# Patient Record
Sex: Male | Born: 1975
Health system: Southern US, Community
[De-identification: ages and names within clinical notes are randomized; demographics above are authoritative.]

## PROBLEM LIST (undated history)

## (undated) DIAGNOSIS — J45909 Unspecified asthma, uncomplicated: Secondary | ICD-10-CM

## (undated) DIAGNOSIS — M1990 Unspecified osteoarthritis, unspecified site: Secondary | ICD-10-CM

## (undated) DIAGNOSIS — E119 Type 2 diabetes mellitus without complications: Secondary | ICD-10-CM

## (undated) DIAGNOSIS — E785 Hyperlipidemia, unspecified: Secondary | ICD-10-CM

## (undated) HISTORY — DX: Unspecified osteoarthritis, unspecified site: M19.90

## (undated) HISTORY — PX: CLUB FOOT RELEASE: SHX1363

## (undated) HISTORY — DX: Hyperlipidemia, unspecified: E78.5

## (undated) HISTORY — DX: Type 2 diabetes mellitus without complications: E11.9

---

## 2012-11-18 ENCOUNTER — Encounter (HOSPITAL_COMMUNITY): Payer: Self-pay | Admitting: Emergency Medicine

## 2012-11-18 ENCOUNTER — Emergency Department (HOSPITAL_COMMUNITY)
Admission: EM | Admit: 2012-11-18 | Discharge: 2012-11-18 | Disposition: A | Discharge status: Home / Self Care | Payer: Medicare Other | Attending: Emergency Medicine | Admitting: Emergency Medicine

## 2012-11-18 DIAGNOSIS — Y929 Unspecified place or not applicable: Secondary | ICD-10-CM | POA: Insufficient documentation

## 2012-11-18 DIAGNOSIS — J45909 Unspecified asthma, uncomplicated: Secondary | ICD-10-CM | POA: Insufficient documentation

## 2012-11-18 DIAGNOSIS — S335XXA Sprain of ligaments of lumbar spine, initial encounter: Secondary | ICD-10-CM | POA: Insufficient documentation

## 2012-11-18 DIAGNOSIS — X503XXA Overexertion from repetitive movements, initial encounter: Secondary | ICD-10-CM | POA: Insufficient documentation

## 2012-11-18 DIAGNOSIS — Y9389 Activity, other specified: Secondary | ICD-10-CM | POA: Insufficient documentation

## 2012-11-18 DIAGNOSIS — S39012A Strain of muscle, fascia and tendon of lower back, initial encounter: Secondary | ICD-10-CM

## 2012-11-18 HISTORY — DX: Unspecified asthma, uncomplicated: J45.909

## 2012-11-18 MED ORDER — NAPROXEN 500 MG PO TABS: 500.0000 mg | ORAL_TABLET | Freq: Two times a day (BID) | ORAL | Status: DC

## 2012-11-18 MED ORDER — CYCLOBENZAPRINE HCL 10 MG PO TABS: 10.0000 mg | ORAL_TABLET | Freq: Two times a day (BID) | ORAL | Status: DC | PRN

## 2012-11-18 NOTE — ED Notes (Signed)
The patient states that he has been lifting a lot of heavy items lately.  States that he woke up today with lower back pain.

## 2012-11-18 NOTE — ED Provider Notes (Signed)
CSN: [id number]     Arrival date & time 11/18/12  1418 History  This chart was scribed for Enid Skeens, MD by Leone Payor, ED Scribe. This patient was seen in room APFT24/APFT24 and the patient's care was started 3:02 PM.    Chief Complaint  Patient presents with  . Back Pain    The history is provided by the patient. No language interpreter was used.    HPI Comments: Casey Daniels is a 37 y.o. male who presents to the Emergency Department complaining of constant, unchanged, low back pain starting last week. Pt states he has been lifting heavy things lately. He has taken goody powder without relief. He states movement aggravates the pain. He denies fever, chills, change in bowel or bladder function, numbness or weakness in lower extremities. Denies history of back surgeries. Denies IV drug use.    Past Medical History  Diagnosis Date  . Asthma    History reviewed. No pertinent past surgical history. No family history on file. History  Substance Use Topics  . Smoking status: Not on file  . Smokeless tobacco: Not on file  . Alcohol Use: Not on file    Review of Systems  Constitutional: Negative for fever and chills.  Cardiovascular: Negative for chest pain.  Genitourinary: Negative for dysuria, flank pain and difficulty urinating.  Musculoskeletal: Positive for back pain.  Neurological: Negative for weakness, numbness and headaches.    Allergies  Bee venom  Home Medications   Current Outpatient Rx  Name  Route  Sig  Dispense  Refill  . albuterol (PROAIR HFA) 108 (90 BASE) MCG/ACT inhaler   Inhalation   Inhale 2 puffs into the lungs every 6 (six) hours as needed for wheezing or shortness of breath.         . Aspirin-Acetaminophen-Caffeine (GOODY HEADACHE PO)   Oral   Take 1 packet by mouth as needed (for pain).          BP 129/96  Pulse 91  Temp(Src) 97.5 F (36.4 C) (Oral)  Resp 24  Ht 6\' 3"  (1.905 m)  Wt 197 lb (89.359 kg)  BMI 24.62 kg/m2  SpO2  100% Physical Exam  Nursing note and vitals reviewed. Constitutional: He is oriented to person, place, and time. He appears well-developed and well-nourished.  HENT:  Head: Normocephalic.  Eyes: EOM are normal.  Neck: Normal range of motion.  Pulmonary/Chest: Effort normal.  Abdominal: He exhibits no distension.  Musculoskeletal: Normal range of motion. He exhibits tenderness.  Sensation to palpation intact bilaterally. 1+ reflexes bilaterally. No midline tenderness. Right lumber paraspinal musculature is tight and tender.   Neurological: He is alert and oriented to person, place, and time. He has normal strength. No sensory deficit.  Reflex Scores:      Patellar reflexes are 1+ on the right side and 1+ on the left side.      Achilles reflexes are 1+ on the right side and 1+ on the left side. 5+ strength in F/E of LE bilat at hips, knees and great toe   Psychiatric: He has a normal mood and affect.    ED Course   Procedures (including critical care time)  DIAGNOSTIC STUDIES: Oxygen Saturation is 100% on RA, normal by my interpretation.    COORDINATION OF CARE: 3:08 PM Discussed treatment plan with pt at bedside and pt agreed to plan.   Labs Reviewed - No data to display No results found. No diagnosis found.  MDM  I personally performed the  services described in this documentation, which was scribed in my presence. The recorded information has been reviewed and is accurate.  Patient denies urinary or bowel changes, active cancer, extremity weakness, IVDU, fevers, immunosuppression or significant trauma.   I see no red flags on exam today nor indication for acute imaging.  Plan is for symptom control, and primary care physician follow up.  Return instructions and discharge instructions provided.  I estimate there is LOW risk for ABDOMINAL AORTIC ANEURYSM, CAUDA EQUINA SYNDROME, or EPIDURAL MASS LESION, thus I consider the discharge disposition reasonable. We have discussed  the diagnosis and risks, and we agree with discharging home to follow-up with their primary doctor. We also discussed returning to the Emergency Department immediately if new or worsening symptoms occur. We have discussed the symptoms which are most concerning (e.g., saddle anesthesia, urinary or bowel incontinence or retention, changing or worsening pain) that necessitate immediate return.    Enid Skeens, MD 11/18/12 [phone]

## 2012-11-18 NOTE — ED Notes (Signed)
Pain rt lower back, Has hurt intermittently, but worse this am. Increased pain with movement.

## 2012-11-18 NOTE — Discharge Instructions (Signed)
If you were given medicines take as directed.  If you are on coumadin or contraceptives realize their levels and effectiveness is altered by many different medicines.  If you have any reaction (rash, tongues swelling, other) to the medicines stop taking and see a physician.   °Please follow up as directed and return to the ER or see a physician for new or worsening symptoms.  Thank you. ° ° °

## 2015-02-19 ENCOUNTER — Encounter (HOSPITAL_COMMUNITY): Payer: Self-pay | Admitting: Nurse Practitioner

## 2015-02-19 ENCOUNTER — Emergency Department (HOSPITAL_COMMUNITY): Payer: Medicare Other

## 2015-02-19 ENCOUNTER — Emergency Department (HOSPITAL_COMMUNITY)
Admission: EM | Admit: 2015-02-19 | Discharge: 2015-02-19 | Disposition: A | Discharge status: Home / Self Care | Payer: Medicare Other | Attending: Emergency Medicine | Admitting: Emergency Medicine

## 2015-02-19 DIAGNOSIS — Z79899 Other long term (current) drug therapy: Secondary | ICD-10-CM | POA: Insufficient documentation

## 2015-02-19 DIAGNOSIS — F17210 Nicotine dependence, cigarettes, uncomplicated: Secondary | ICD-10-CM | POA: Diagnosis not present

## 2015-02-19 DIAGNOSIS — J45909 Unspecified asthma, uncomplicated: Secondary | ICD-10-CM | POA: Insufficient documentation

## 2015-02-19 DIAGNOSIS — Y998 Other external cause status: Secondary | ICD-10-CM | POA: Diagnosis not present

## 2015-02-19 DIAGNOSIS — Y9289 Other specified places as the place of occurrence of the external cause: Secondary | ICD-10-CM | POA: Diagnosis not present

## 2015-02-19 DIAGNOSIS — S6981XA Other specified injuries of right wrist, hand and finger(s), initial encounter: Secondary | ICD-10-CM | POA: Insufficient documentation

## 2015-02-19 DIAGNOSIS — Z791 Long term (current) use of non-steroidal anti-inflammatories (NSAID): Secondary | ICD-10-CM | POA: Diagnosis not present

## 2015-02-19 DIAGNOSIS — Y9389 Activity, other specified: Secondary | ICD-10-CM | POA: Insufficient documentation

## 2015-02-19 DIAGNOSIS — M79641 Pain in right hand: Secondary | ICD-10-CM

## 2015-02-19 DIAGNOSIS — Z23 Encounter for immunization: Secondary | ICD-10-CM | POA: Diagnosis not present

## 2015-02-19 MED ORDER — TETANUS-DIPHTH-ACELL PERTUSSIS 5-2.5-18.5 LF-MCG/0.5 IM SUSP
0.5000 mL | Freq: Once | INTRAMUSCULAR | Status: AC
  Administered 2015-02-19: 0.5 mL via INTRAMUSCULAR
  Filled 2015-02-19: qty 0.5

## 2015-02-19 MED ORDER — AMOXICILLIN-POT CLAVULANATE 875-125 MG PO TABS: 1.0000 | ORAL_TABLET | Freq: Every day | ORAL | Status: DC
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
## 2016-05-05 NOTE — Congregational Nurse Program (Signed)
Congregational Nurse Program Note  Date of Encounter: 05/05/2016  Past Medical History: Past Medical History:  Diagnosis Date  . Asthma     Encounter Details:     CNP Questionnaire - 05/05/16 1300      Patient Demographics   Is this a new or existing patient? Existing   Patient is considered a/an Not Applicable   Race African-American/Black     Patient Assistance   Location of Patient Assistance [place]   Patient's financial/insurance status Medicaid   Uninsured Patient ([place] [employer]) No   Patient referred to apply for the following financial assistance Not Applicable   Food insecurities addressed Provided food supplies   Transportation assistance Yes   Type of Assistance [place]   Assistance securing medications No   Educational health offerings Health literacy;Safety;Nutrition     Encounter Details   Primary purpose of visit Education/Health Concerns;Safety   Was an Emergency Department visit averted? Not Applicable   Does patient have a medical provider? Yes   Patient referred to [facility]   Was a mental health screening completed? (GAINS tool) No   Does patient have dental issues? No   Does patient have vision issues? No   Does your patient have an abnormal blood pressure today? No   Since previous encounter, have you referred patient for abnormal blood pressure that resulted in a new diagnosis or medication change? No   Does your patient have an abnormal blood glucose today? No   Since previous encounter, have you referred patient for abnormal blood glucose that resulted in a new diagnosis or medication change? No   Was there a life-saving intervention made? No     05/05/16  B/P 136/82 P 75 Has old insect bites on skin arms, legs and back, been scratching and has several open lesions.  Advised to see PCP for treatment.  Will need to ride [employer] bus.  Went home to get insurance cards.  Will do follow up check. Marko Plume [phone]

## 2016-05-26 ENCOUNTER — Ambulatory Visit (INDEPENDENT_AMBULATORY_CARE_PROVIDER_SITE_OTHER): Payer: Medicare Other | Admitting: Family

## 2016-05-26 ENCOUNTER — Encounter: Payer: Self-pay | Admitting: Family

## 2016-05-26 VITALS — BP 119/82 | HR 83 | Temp 97.3°F | Ht 75.0 in | Wt 216.0 lb

## 2016-05-26 DIAGNOSIS — Z0000 Encounter for general adult medical examination without abnormal findings: Secondary | ICD-10-CM

## 2016-05-26 DIAGNOSIS — Z114 Encounter for screening for human immunodeficiency virus [HIV]: Secondary | ICD-10-CM

## 2016-05-26 DIAGNOSIS — J4540 Moderate persistent asthma, uncomplicated: Secondary | ICD-10-CM

## 2016-05-26 DIAGNOSIS — J45909 Unspecified asthma, uncomplicated: Secondary | ICD-10-CM | POA: Insufficient documentation

## 2016-05-26 DIAGNOSIS — W57XXXA Bitten or stung by nonvenomous insect and other nonvenomous arthropods, initial encounter: Secondary | ICD-10-CM | POA: Diagnosis not present

## 2016-05-26 MED ORDER — ALBUTEROL SULFATE HFA 108 (90 BASE) MCG/ACT IN AERS: 2.0000 | INHALATION_SPRAY | Freq: Four times a day (QID) | RESPIRATORY_TRACT | 1 refills | Status: DC | PRN

## 2016-05-26 MED ORDER — MONTELUKAST SODIUM 10 MG PO TABS: 10.0000 mg | ORAL_TABLET | Freq: Every day | ORAL | 3 refills | Status: DC

## 2016-05-26 MED ORDER — BUDESONIDE-FORMOTEROL FUMARATE 80-4.5 MCG/ACT IN AERO: 2.0000 | INHALATION_SPRAY | Freq: Two times a day (BID) | RESPIRATORY_TRACT | 3 refills | Status: DC

## 2016-05-26 MED ORDER — ALBUTEROL SULFATE (2.5 MG/3ML) 0.083% IN NEBU: 2.5000 mg | INHALATION_SOLUTION | Freq: Four times a day (QID) | RESPIRATORY_TRACT | 1 refills | Status: DC | PRN

## 2016-05-26 MED ORDER — HYDROXYZINE HCL 25 MG PO TABS: 25.0000 mg | ORAL_TABLET | Freq: Three times a day (TID) | ORAL | 2 refills | Status: DC | PRN

## 2016-05-26 NOTE — Progress Notes (Signed)
Subjective:    Patient ID: Casey Daniels, male    DOB: June 18, 1975, 41 y.o.   MRN: [id number]  PT presents to the office today to establish care and for CPE. Pt has asthma that states is controlled, but has to use his albuterol daily. PT is complaining of rash from bedbugs. Pt states he has thrown away his bed and recliner. He continues to itch". Asthma  He complains of frequent throat clearing and wheezing. There is no cough or shortness of breath. This is a chronic problem. The current episode started more than 1 year ago. The problem occurs intermittently. The problem has been waxing and waning. Associated symptoms include postnasal drip and rhinorrhea. Pertinent negatives include no nasal congestion or trouble swallowing. His symptoms are aggravated by pollen. His symptoms are alleviated by rest. He reports moderate improvement on treatment. His past medical history is significant for asthma.       Review of Systems  HENT: Positive for postnasal drip and rhinorrhea. Negative for trouble swallowing.   Respiratory: Positive for wheezing. Negative for cough and shortness of breath.   All other systems reviewed and are negative.  Social History   Social History  . Marital status: Single    Spouse name: N/A  . Number of children: N/A  . Years of education: N/A   Social History Main Topics  . Smoking status: Current Every Day Smoker    Types: Cigarettes  . Smokeless tobacco: Never Used  . Alcohol use Yes  . Drug use: Yes    Types: Marijuana  . Sexual activity: Not Asked   Other Topics Concern  . None   Social History Narrative  . None    Family History  Problem Relation Age of Onset  . Diabetes Mother        Objective:   Physical Exam  Constitutional: He is oriented to person, place, and time. He appears well-developed and well-nourished. No distress.  HENT:  Head: Normocephalic.  Right Ear: External ear normal.  Left Ear: External ear normal.  Nose: Nose normal.    Mouth/Throat: Oropharynx is clear and moist.  Eyes: Pupils are equal, round, and reactive to light. Right eye exhibits no discharge. Left eye exhibits no discharge.  Neck: Normal range of motion. Neck supple. No thyromegaly present.  Cardiovascular: Normal rate, regular rhythm, normal heart sounds and intact distal pulses.   No murmur heard. Pulmonary/Chest: Effort normal. No respiratory distress. He has wheezes.  Abdominal: Soft. Bowel sounds are normal. He exhibits no distension. There is no tenderness.  Musculoskeletal: Normal range of motion. He exhibits no edema or tenderness.  Neurological: He is alert and oriented to person, place, and time.  Skin: Skin is warm and dry. Rash noted. No erythema.  Psychiatric: He has a normal mood and affect. His behavior is normal. Judgment and thought content normal.  Vitals reviewed.     BP 119/82   Pulse 83   Temp 97.3 F (36.3 C) (Oral)   Ht 6' 3"  (1.905 m)   Wt 216 lb (98 kg)   BMI 27.00 kg/m '    Assessment & Plan:  1. Moderate persistent asthma without complication -Pt started Singulair and Symbicort today -Refilled albuterol today Smoking cessation discussed today - montelukast (SINGULAIR) 10 MG tablet; Take 1 tablet (10 mg total) by mouth at bedtime.  Dispense: 90 tablet; Refill: 3 - budesonide-formoterol (SYMBICORT) 80-4.5 MCG/ACT inhaler; Inhale 2 puffs into the lungs 2 (two) times daily.  Dispense: 1 Inhaler; Refill: 3 -  CBC with Differential/Platelet - CMP14+EGFR - albuterol (PROAIR HFA) 108 (90 Base) MCG/ACT inhaler; Inhale 2 puffs into the lungs every 6 (six) hours as needed for wheezing or shortness of breath.  Dispense: 8 g; Refill: 1 - albuterol (PROVENTIL) (2.5 MG/3ML) 0.083% nebulizer solution; Take 3 mLs (2.5 mg total) by nebulization every 6 (six) hours as needed for wheezing or shortness of breath.  Dispense: 150 mL; Refill: 1  2. Annual physical exam - CBC with Differential/Platelet - CMP14+EGFR - Lipid panel -  Thyroid Panel With TSH - VITAMIN D 25 Hydroxy (Vit-D Deficiency, Fractures) - PSA, total and free - HIV antibody  3. Bedbug bite, initial encounter -Discussed putting all clothes and bedding in trash bags for three days, vacuum all bedding, couches ect - CBC with Differential/Platelet - CMP14+EGFR - hydrOXYzine (ATARAX/VISTARIL) 25 MG tablet; Take 1 tablet (25 mg total) by mouth 3 (three) times daily as needed.  Dispense: 30 tablet; Refill: 2  4. Encounter for screening for HIV - CBC with Differential/Platelet - CMP14+EGFR - HIV antibody   Continue all meds Labs pending Health Maintenance reviewed Diet and exercise encouraged RTO 6 months   Evelina Dun, FNP

## 2016-05-26 NOTE — Patient Instructions (Signed)
Bedbugs Introduction Bedbugs are tiny bugs that live in and around beds. During the day, they stay hidden. At night, they come out and bite. Where are bedbugs found? Bedbugs can be found anywhere. It does not matter if a place is clean or dirty. They are often found in:  Hotels.  Shelters.  Dorms.  Hospitals.  Nursing homes.  Places where there are many birds or bats. What are bedbug bites like? A bedbug bite leaves a small red bump with a darker red dot in the middle. The bump may show up soon after a person is bitten or a day or more later. Bedbug bites usually do not hurt, but they may itch. Most people do not need treatment for bedbug bites. The bumps usually go away on their own in a few days. How do I check for bedbugs? Bedbugs are reddish-brown, oval, and flat. They are very small and they cannot fly. Look for bedbugs in these places:  On mattresses, bed frames, headboards, and box springs.  On drapes and curtains in bedrooms.  Under the carpet in bedrooms.  Behind electrical outlets.  Behind any wallpaper that is peeling.  Inside luggage. Also look for black or red spots or stains on or near the bed. What should I do if I find bedbugs? When Traveling  Check your clothes, suitcase, and belongings for bedbugs before you go back home. You may want to throw away anything that has bedbugs on it. At Home  Your bedroom may need to be treated by a pest control expert. You may also need to throw away mattresses or luggage. To help keep bedbugs from coming back, you may want to:  Put a plastic cover over your mattress.  Wash your clothes and bedding in water that is hotter than 120F (48.9C). Dry them on a hot setting.  Vacuum often around the bed and in all of the cracks where the bugs might hide.  Check all used furniture, bedding, or clothes that you bring into your home.  Get rid of bird nests and bat roosts that are near your home. In Your Bed  Try wearing  pajamas that have long sleeves and pant legs. Bedbugs usually bite areas of the skin that are not covered. This information is not intended to replace advice given to you by your health care provider. Make sure you discuss any questions you have with your health care provider. Document Released: 07/05/2010 Document Revised: 08/26/2015 Document Reviewed: 03/16/2014  2017 Elsevier

## 2016-05-27 LAB — CMP14+EGFR
A/G RATIO: 1.5 (ref 1.2–2.2)
ALBUMIN: 4.4 g/dL (ref 3.5–5.5)
ALK PHOS: 64 IU/L (ref 39–117)
ALT: 32 IU/L (ref 0–44)
AST: 42 IU/L — ABNORMAL HIGH (ref 0–40)
BUN / CREAT RATIO: 12 (ref 9–20)
BUN: 15 mg/dL (ref 6–24)
Bilirubin Total: 0.2 mg/dL (ref 0.0–1.2)
CHLORIDE: 101 mmol/L (ref 96–106)
CO2: 24 mmol/L (ref 18–29)
Calcium: 9.9 mg/dL (ref 8.7–10.2)
Creatinine, Ser: 1.24 mg/dL (ref 0.76–1.27)
GFR calc Af Amer: 82 mL/min/{1.73_m2} (ref >59)
GFR calc non Af Amer: 71 mL/min/{1.73_m2} (ref >59)
GLUCOSE: 109 mg/dL — AB (ref 65–99)
Globulin, Total: 3 g/dL (ref 1.5–4.5)
POTASSIUM: 5 mmol/L (ref 3.5–5.2)
Sodium: 140 mmol/L (ref 134–144)
Total Protein: 7.4 g/dL (ref 6.0–8.5)

## 2016-05-27 LAB — CBC WITH DIFFERENTIAL/PLATELET
BASOS ABS: 0 10*3/uL (ref 0.0–0.2)
BASOS: 0 %
EOS (ABSOLUTE): 0.1 10*3/uL (ref 0.0–0.4)
Eos: 2 %
HEMOGLOBIN: 14.4 g/dL (ref 13.0–17.7)
Hematocrit: 44.9 % (ref 37.5–51.0)
Immature Grans (Abs): 0 10*3/uL (ref 0.0–0.1)
Immature Granulocytes: 0 %
Lymphocytes Absolute: 1.6 10*3/uL (ref 0.7–3.1)
Lymphs: 35 %
MCH: 28.9 pg (ref 26.6–33.0)
MCHC: 32.1 g/dL (ref 31.5–35.7)
MCV: 90 fL (ref 79–97)
Monocytes Absolute: 0.4 10*3/uL (ref 0.1–0.9)
Monocytes: 9 %
NEUTROS ABS: 2.4 10*3/uL (ref 1.4–7.0)
Neutrophils: 54 %
PLATELETS: 275 10*3/uL (ref 150–379)
RBC: 4.98 x10E6/uL (ref 4.14–5.80)
RDW: 14.1 % (ref 12.3–15.4)
WBC: 4.6 10*3/uL (ref 3.4–10.8)

## 2016-05-27 LAB — THYROID PANEL WITH TSH
Free Thyroxine Index: 1.6 (ref 1.2–4.9)
T3 UPTAKE RATIO: 32 % (ref 24–39)
T4 TOTAL: 5 ug/dL (ref 4.5–12.0)
TSH: 2.93 u[IU]/mL (ref 0.450–4.500)

## 2016-05-27 LAB — LIPID PANEL
CHOLESTEROL TOTAL: 163 mg/dL (ref 100–199)
Chol/HDL Ratio: 2.9 ratio units (ref 0.0–5.0)
HDL: 56 mg/dL (ref >39)
LDL Calculated: 95 mg/dL (ref 0–99)
Triglycerides: 62 mg/dL (ref 0–149)
VLDL CHOLESTEROL CAL: 12 mg/dL (ref 5–40)

## 2016-05-27 LAB — PSA, TOTAL AND FREE
PROSTATE SPECIFIC AG, SERUM: 0.4 ng/mL (ref 0.0–4.0)
PSA FREE PCT: 20 %
PSA, Free: 0.08 ng/mL

## 2016-05-27 LAB — HIV ANTIBODY (ROUTINE TESTING W REFLEX): HIV Screen 4th Generation wRfx: NONREACTIVE

## 2016-05-27 LAB — VITAMIN D 25 HYDROXY (VIT D DEFICIENCY, FRACTURES): VIT D 25 HYDROXY: 6.1 ng/mL — AB (ref 30.0–100.0)

## 2016-05-29 ENCOUNTER — Other Ambulatory Visit: Payer: Self-pay | Admitting: Family

## 2016-05-29 DIAGNOSIS — E559 Vitamin D deficiency, unspecified: Secondary | ICD-10-CM | POA: Insufficient documentation

## 2016-05-29 MED ORDER — VITAMIN D (ERGOCALCIFEROL) 1.25 MG (50000 UNIT) PO CAPS: 50000.0000 [IU] | ORAL_CAPSULE | ORAL | 3 refills | Status: DC

## 2016-08-31 ENCOUNTER — Other Ambulatory Visit: Payer: Self-pay | Admitting: Family

## 2016-08-31 DIAGNOSIS — J4540 Moderate persistent asthma, uncomplicated: Secondary | ICD-10-CM

## 2016-11-10 ENCOUNTER — Ambulatory Visit: Payer: Medicare Other | Admitting: Family

## 2016-11-13 ENCOUNTER — Encounter: Payer: Self-pay | Admitting: Family

## 2016-11-15 ENCOUNTER — Ambulatory Visit (INDEPENDENT_AMBULATORY_CARE_PROVIDER_SITE_OTHER): Payer: Medicare Other | Admitting: Pediatrics

## 2016-11-15 ENCOUNTER — Encounter: Payer: Self-pay | Admitting: Pediatrics

## 2016-11-15 VITALS — BP 132/89 | HR 83 | Temp 97.4°F | Ht 75.0 in | Wt 232.8 lb

## 2016-11-15 DIAGNOSIS — M7022 Olecranon bursitis, left elbow: Secondary | ICD-10-CM | POA: Diagnosis not present

## 2016-11-15 DIAGNOSIS — J4540 Moderate persistent asthma, uncomplicated: Secondary | ICD-10-CM

## 2016-11-15 MED ORDER — BUDESONIDE-FORMOTEROL FUMARATE 80-4.5 MCG/ACT IN AERO: 2.0000 | INHALATION_SPRAY | Freq: Two times a day (BID) | RESPIRATORY_TRACT | 3 refills | Status: DC

## 2016-11-15 MED ORDER — ALBUTEROL SULFATE (2.5 MG/3ML) 0.083% IN NEBU: 2.5000 mg | INHALATION_SOLUTION | Freq: Four times a day (QID) | RESPIRATORY_TRACT | 1 refills | Status: DC | PRN

## 2016-11-15 NOTE — Patient Instructions (Signed)
600-800mg  of ibuprofen every 8 hours for the next 5 days

## 2016-11-15 NOTE — Progress Notes (Signed)
  Subjective:   Patient ID: Casey Daniels, male    DOB: 1975/08/29, 41 y.o.   MRN: [id number] CC: Edema (Right elbow) and Pain (Right Elbow)  HPI: Casey Daniels is a 41 y.o. male presenting for Edema (Right elbow) and Pain (Right Elbow)  Started about a week ago Swollen area over elbow olecranon Leans on L elbow a lot Some tenderness to area Minimal pain/tenderness with straightening elbow, most of pain in isolated swelling area No h/o gout No fevers No redness Otherwise feeling well Ran out of symbicort last month Has albuterol at home, used last dose a few days ago Does feel like he is wheezing at times, winter tends to have more trouble breathing  Relevant past medical, surgical, family and social history reviewed. Allergies and medications reviewed and updated. History  Smoking Status  . Current Every Day Smoker  . Types: Cigarettes  Smokeless Tobacco  . Never Used   ROS: Per HPI   Objective:    BP 132/89   Pulse 83   Temp (!) 97.4 F (36.3 C) (Oral)   Ht 6\' 3"  (1.905 m)   Wt 232 lb 12.8 oz (105.6 kg)   BMI 29.10 kg/m   Wt Readings from Last 3 Encounters:  11/15/16 232 lb 12.8 oz (105.6 kg)  05/26/16 216 lb (98 kg)  11/18/12 197 lb (89.4 kg)    Gen: NAD, alert, cooperative with exam, NCAT EYES: EOMI, no conjunctival injection, or no icterus CV: NRRR, normal S1/S2, no murmur, distal pulses 2+ b/l Resp: moving air well, slight exp wheeze present b/l, normal WOB.  ABD: +BS, soft, NTND.  Ext: No edema, warm Neuro: Alert and oriented, strength equal b/l UE and LE, coordination grossly normal  MSK: swelling of L olecranon bursa Minimal ttp No redness No point tenderness over lateral/medial epicondyles L elbow Minimal discomfort in swollen area with flex/ext of elbow against resistance  Assessment & Plan:  Casey Daniels was seen today for edema and pain.  Diagnoses and all orders for this visit:  Olecranon bursitis of left elbow NSAIDs, rest, ice Avoid  resting on the elbow  Moderate persistent asthma without complication Slight wheezing today Off of symbicort Take symbicort daily, albuterol BID for the next 2-3 days Has f/u appt in 2 weeks -     albuterol (PROVENTIL) (2.5 MG/3ML) 0.083% nebulizer solution; Take 3 mLs (2.5 mg total) by nebulization every 6 (six) hours as needed for wheezing or shortness of breath. -     budesonide-formoterol (SYMBICORT) 80-4.5 MCG/ACT inhaler; Inhale 2 puffs into the lungs 2 (two) times daily.   Follow up plan: 2 weeks as scheduled Casey Found, MD [place]

## 2016-11-24 ENCOUNTER — Ambulatory Visit: Payer: Medicare Other | Admitting: Family

## 2016-11-28 ENCOUNTER — Encounter: Payer: Self-pay | Admitting: Family

## 2016-11-28 ENCOUNTER — Ambulatory Visit (INDEPENDENT_AMBULATORY_CARE_PROVIDER_SITE_OTHER): Payer: Medicare Other | Admitting: Family

## 2016-11-28 VITALS — BP 119/81 | HR 91 | Temp 97.2°F | Ht 75.0 in | Wt 232.0 lb

## 2016-11-28 DIAGNOSIS — E663 Overweight: Secondary | ICD-10-CM | POA: Diagnosis not present

## 2016-11-28 DIAGNOSIS — E559 Vitamin D deficiency, unspecified: Secondary | ICD-10-CM

## 2016-11-28 DIAGNOSIS — J4540 Moderate persistent asthma, uncomplicated: Secondary | ICD-10-CM | POA: Diagnosis not present

## 2016-11-28 DIAGNOSIS — F1010 Alcohol abuse, uncomplicated: Secondary | ICD-10-CM | POA: Diagnosis not present

## 2016-11-28 NOTE — Progress Notes (Signed)
   Subjective:    Patient ID: Casey Daniels, male    DOB: September 24, 1975, 41 y.o.   MRN: [id number]  Pt presents to the office today for chronic follow up. Pt states he drinks 4-6 beers everyday.  Asthma  He complains of hoarse voice. There is no cough or wheezing. This is a chronic problem. The current episode started more than 1 year ago. The problem occurs intermittently. The problem has been waxing and waning. Associated symptoms include rhinorrhea. Pertinent negatives include no ear congestion, ear pain or sneezing. His symptoms are alleviated by rest. He reports moderate improvement on treatment. His past medical history is significant for asthma.      Review of Systems  HENT: Positive for hoarse voice and rhinorrhea. Negative for ear pain and sneezing.   Respiratory: Negative for cough and wheezing.   All other systems reviewed and are negative.      Objective:   Physical Exam  Constitutional: He is oriented to person, place, and time. He appears well-developed and well-nourished. No distress.  HENT:  Head: Normocephalic.  Right Ear: External ear normal.  Left Ear: External ear normal.  Nose: Nose normal.  Mouth/Throat: Oropharynx is clear and moist.  Eyes: Pupils are equal, round, and reactive to light. Right eye exhibits no discharge. Left eye exhibits no discharge.  Neck: Normal range of motion. Neck supple. No thyromegaly present.  Cardiovascular: Normal rate, regular rhythm, normal heart sounds and intact distal pulses.   No murmur heard. Pulmonary/Chest: Effort normal and breath sounds normal. No respiratory distress. He has no wheezes.  Abdominal: Soft. Bowel sounds are normal. He exhibits no distension. There is no tenderness.  Musculoskeletal: Normal range of motion. He exhibits no edema or tenderness.  Neurological: He is alert and oriented to person, place, and time.  Skin: Skin is warm and dry. No rash noted. No erythema.  Psychiatric: He has a normal mood and  affect. His behavior is normal. Judgment and thought content normal.  Vitals reviewed.  BP 119/81   Pulse 91   Temp (!) 97.2 F (36.2 C) (Oral)   Ht _0  (1.905 m)   Wt 232 lb (105.2 kg)   BMI 29.00 kg/m      Assessment & Plan:  1. Moderate persistent asthma without complication - PYK9+XIPJ - Hepatic function panel  2. Vitamin D deficiency - BMP8+EGFR - Hepatic function panel  3. Overweight (BMI 25.0-29.9) - BMP8+EGFR - Hepatic function panel  4. Excessive drinking of alcohol - BMP8+EGFR - Hepatic function panel   Continue all meds Labs pending Health Maintenance reviewed Diet and exercise encouraged RTO 6 months   Evelina Dun, FNP

## 2016-11-28 NOTE — Patient Instructions (Signed)

## 2016-11-29 LAB — HEPATIC FUNCTION PANEL
ALK PHOS: 63 IU/L (ref 39–117)
ALT: 43 IU/L (ref 0–44)
AST: 41 IU/L — ABNORMAL HIGH (ref 0–40)
Albumin: 4.7 g/dL (ref 3.5–5.5)
BILIRUBIN, DIRECT: 0.11 mg/dL (ref 0.00–0.40)
Bilirubin Total: 0.3 mg/dL (ref 0.0–1.2)
Total Protein: 7.4 g/dL (ref 6.0–8.5)

## 2016-11-29 LAB — BMP8+EGFR
BUN / CREAT RATIO: 10 (ref 9–20)
BUN: 14 mg/dL (ref 6–24)
CO2: 22 mmol/L (ref 20–29)
CREATININE: 1.42 mg/dL — AB (ref 0.76–1.27)
Calcium: 10 mg/dL (ref 8.7–10.2)
Chloride: 103 mmol/L (ref 96–106)
GFR calc non Af Amer: 60 mL/min/{1.73_m2} (ref >59)
GFR, EST AFRICAN AMERICAN: 70 mL/min/{1.73_m2} (ref >59)
Glucose: 98 mg/dL (ref 65–99)
Potassium: 4.4 mmol/L (ref 3.5–5.2)
Sodium: 140 mmol/L (ref 134–144)

## 2017-01-25 ENCOUNTER — Ambulatory Visit: Payer: Medicare Other

## 2017-01-29 ENCOUNTER — Ambulatory Visit (INDEPENDENT_AMBULATORY_CARE_PROVIDER_SITE_OTHER): Payer: Medicare Other

## 2017-01-29 DIAGNOSIS — Z23 Encounter for immunization: Secondary | ICD-10-CM

## 2017-02-10 ENCOUNTER — Other Ambulatory Visit: Payer: Self-pay | Admitting: Family

## 2017-02-10 DIAGNOSIS — J4540 Moderate persistent asthma, uncomplicated: Secondary | ICD-10-CM

## 2017-02-14 ENCOUNTER — Ambulatory Visit: Payer: Medicare Other | Admitting: *Deleted

## 2017-02-19 ENCOUNTER — Encounter: Payer: Self-pay | Admitting: Family

## 2017-02-26 ENCOUNTER — Encounter: Payer: Self-pay | Admitting: *Deleted

## 2017-02-26 ENCOUNTER — Ambulatory Visit (INDEPENDENT_AMBULATORY_CARE_PROVIDER_SITE_OTHER): Payer: Medicare Other | Admitting: *Deleted

## 2017-02-26 VITALS — BP 135/85 | HR 78 | Ht 72.0 in | Wt 231.0 lb

## 2017-02-26 DIAGNOSIS — Z0000 Encounter for general adult medical examination without abnormal findings: Secondary | ICD-10-CM

## 2017-02-26 NOTE — Patient Instructions (Addendum)
  Casey Daniels ,  Thank you for taking time to come for your Medicare Wellness Visit. I appreciate your ongoing commitment to your health goals. Please review the following plan we discussed and let me know if I can assist you in the future.   These are the goals we discussed: Continue to stay active daily.    This is a list of the screening recommended for you and due dates:  Health Maintenance  Topic Date Due  . Tetanus Vaccine  02/18/2025  . Flu Shot  Completed  . HIV Screening  Completed

## 2017-03-07 NOTE — Progress Notes (Signed)
Subjective:   Casey Daniels is a 41 y.o. male who presents for an Initial Medicare Annual Wellness Visit. Casey Daniels is single and lives in an apartment in [place]. He does not have any children.   Review of Systems  Health is about the same as last year.   Cardiac Risk Factors include: smoking/ tobacco exposure;male gender    Objective:    Today's Vitals   02/26/17 1115  BP: 135/85  Pulse: 78  Weight: 231 lb (104.8 kg)  Height: 6' (1.829 m)   Body mass index is 31.33 kg/m.  Advanced Directives 02/26/2017  Does Patient Have a Medical Advance Directive? No    Current Medications (verified) Outpatient Encounter Medications as of 02/26/2017  Medication Sig  . albuterol (PROVENTIL) (2.5 MG/3ML) 0.083% nebulizer solution Take 3 mLs (2.5 mg total) by nebulization every 6 (six) hours as needed for wheezing or shortness of breath.  Marland Kitchen albuterol (PROVENTIL) (2.5 MG/3ML) 0.083% nebulizer solution USE ONE vial in nebulizer every SIX hours as needed FOR wheezing OR shortness of breath  . budesonide-formoterol (SYMBICORT) 80-4.5 MCG/ACT inhaler Inhale 2 puffs into the lungs 2 (two) times daily.  Marland Kitchen PROAIR HFA 108 (90 Base) MCG/ACT inhaler inhale TWO puffs into THE lungs every SIX hours as needed FOR wheezing OR shortness of breath   No facility-administered encounter medications on file as of 02/26/2017.     Allergies (verified) Bee venom   History: Past Medical History:  Diagnosis Date  . Arthritis    bilateral ankles due to club foot release as an infant  . Asthma    Past Surgical History:  Procedure Laterality Date  . CLUB FOOT RELEASE Bilateral    Family History  Problem Relation Age of Onset  . Diabetes Mother   . Healthy Brother    Social History   Socioeconomic History  . Marital status: Single    Spouse name: None  . Number of children: 0  . Years of education: None  . Highest education level: None  Social Needs  . Financial resource strain: Not very  hard  . Food insecurity - worry: Never true  . Food insecurity - inability: Never true  . Transportation needs - medical: No  . Transportation needs - non-medical: No  Occupational History  . Occupation: unemployed    Comment: disabled  Tobacco Use  . Smoking status: Current Every Day Smoker    Packs/day: 0.25    Years: 15.00    Pack years: 3.75    Types: Cigarettes  . Smokeless tobacco: Never Used  . Tobacco comment: quits off and on  Substance and Sexual Activity  . Alcohol use: No    Frequency: Never    Comment: No alcohol since 12/2016  . Drug use: Yes    Types: Marijuana  . Sexual activity: None  Other Topics Concern  . None  Social History Narrative  . None   Tobacco Counseling Ready to quit: Yes Counseling given: Yes Comment: quits off and on  Activities of Daily Living In your present state of health, do you have any difficulty performing the following activities: 02/26/2017  Hearing? N  Vision? N  Difficulty concentrating or making decisions? N  Walking or climbing stairs? N  Dressing or bathing? N  Doing errands, shopping? N  Preparing Food and eating ? N  Using the Toilet? N  In the past six months, have you accidently leaked urine? N  Do you have problems with loss of bowel control? N  Managing your Medications? N  Managing your Finances? N  Housekeeping or managing your Housekeeping? N  Some recent data might be hidden      Immunizations and Health Maintenance Immunization History  Administered Date(s) Administered  . Influenza,inj,Quad PF,6+ Mos 01/29/2017  . Influenza,trivalent, recombinat, inj, PF 04/17/2015  . Tdap 02/19/2015   There are no preventive care reminders to display for this patient.  Patient Care Team: Sharion Balloon, FNP as PCP - General (Family Medicine)     Assessment:   This is a routine wellness examination for Casey Daniels.   Hearing/Vision screen No deficits noted during vsiit  Dietary issues and exercise activities  discussed: Type of exercise: walking;Other - see comments(biking), Time (Minutes): 60, Frequency (Times/Week): 7, Weekly Exercise (Minutes/Week): 420, Intensity: Moderate  Goals Plan meals. Eat more lean proteins, fruits, and vegetables.   Depression Screen PHQ 2/9 Scores 02/26/2017 11/28/2016 11/15/2016 05/26/2016  PHQ - 2 Score 1 1 0 0    Fall Risk Fall Risk  02/26/2017 11/15/2016  Falls in the past year? No No    Cognitive Function: MMSE - Mini Mental State Exam 02/26/2017  Not completed: Unable to complete        Screening Tests Health Maintenance  Topic Date Due  . TETANUS/TDAP  02/18/2025  . INFLUENZA VACCINE  Completed  . HIV Screening  Completed     Plan:   Continue to stay active.  Plan meals and eat more lean proteins, fruits, and vegetables.  Keep f/u with PCP Wear a helmet while riding your bike Stop smoking  I have personally reviewed and noted the following in the patient's chart:   . Medical and social history . Use of alcohol, tobacco or illicit drugs  . Current medications and supplements . Functional ability and status . Nutritional status . Physical activity . Advanced directives . List of other physicians . Hospitalizations, surgeries, and ER visits in previous 12 months . Vitals . Screenings to include cognitive, depression, and falls . Referrals and appointments  In addition, I have reviewed and discussed with patient certain preventive protocols, quality metrics, and best practice recommendations. A written personalized care plan for preventive services as well as general preventive health recommendations were provided to patient.     Chong Sicilian, RN   02/26/2017  I have reviewed and agree with the above AWV documentation.   Evelina Dun, FNP

## 2017-04-30 ENCOUNTER — Ambulatory Visit (INDEPENDENT_AMBULATORY_CARE_PROVIDER_SITE_OTHER): Payer: Medicare Other | Admitting: Family

## 2017-04-30 ENCOUNTER — Encounter: Payer: Self-pay | Admitting: Family

## 2017-04-30 VITALS — BP 116/70 | HR 74 | Temp 97.4°F | Ht 72.0 in | Wt 219.0 lb

## 2017-04-30 DIAGNOSIS — N451 Epididymitis: Secondary | ICD-10-CM | POA: Diagnosis not present

## 2017-04-30 DIAGNOSIS — N5089 Other specified disorders of the male genital organs: Secondary | ICD-10-CM

## 2017-04-30 DIAGNOSIS — Z0189 Encounter for other specified special examinations: Secondary | ICD-10-CM | POA: Diagnosis not present

## 2017-04-30 LAB — CMP14+EGFR
A/G RATIO: 1.5 (ref 1.2–2.2)
ALBUMIN: 4.3 g/dL (ref 3.5–5.5)
ALT: 30 IU/L (ref 0–44)
AST: 19 IU/L (ref 0–40)
Alkaline Phosphatase: 68 IU/L (ref 39–117)
BUN / CREAT RATIO: 14 (ref 9–20)
BUN: 17 mg/dL (ref 6–24)
CHLORIDE: 105 mmol/L (ref 96–106)
CO2: 22 mmol/L (ref 20–29)
Calcium: 9.6 mg/dL (ref 8.7–10.2)
Creatinine, Ser: 1.19 mg/dL (ref 0.76–1.27)
GFR calc non Af Amer: 74 mL/min/{1.73_m2} (ref >59)
GFR, EST AFRICAN AMERICAN: 86 mL/min/{1.73_m2} (ref >59)
Globulin, Total: 2.9 g/dL (ref 1.5–4.5)
Glucose: 117 mg/dL — ABNORMAL HIGH (ref 65–99)
POTASSIUM: 4.3 mmol/L (ref 3.5–5.2)
SODIUM: 143 mmol/L (ref 134–144)
TOTAL PROTEIN: 7.2 g/dL (ref 6.0–8.5)

## 2017-04-30 LAB — URINALYSIS, COMPLETE
BILIRUBIN UA: NEGATIVE
GLUCOSE, UA: NEGATIVE
Ketones, UA: NEGATIVE
LEUKOCYTES UA: NEGATIVE
Nitrite, UA: NEGATIVE
PH UA: 6 (ref 5.0–7.5)
Specific Gravity, UA: >1.03 — ABNORMAL HIGH (ref 1.005–1.030)
Urobilinogen, Ur: 0.2 mg/dL (ref 0.2–1.0)

## 2017-04-30 LAB — MICROSCOPIC EXAMINATION
Epithelial Cells (non renal): NONE SEEN /hpf (ref 0–10)
RBC, UA: NONE SEEN /hpf (ref 0–?)
Renal Epithel, UA: NONE SEEN /hpf
WBC, UA: NONE SEEN /hpf (ref 0–?)

## 2017-04-30 MED ORDER — AZITHROMYCIN 500 MG PO TABS: 1000.0000 mg | ORAL_TABLET | Freq: Every day | ORAL | 0 refills | Status: DC

## 2017-04-30 MED ORDER — CEFTRIAXONE SODIUM 1 G IJ SOLR
250.0000 mg | Freq: Once | INTRAMUSCULAR | Status: AC
  Administered 2017-04-30: 250 mg via INTRAMUSCULAR

## 2017-04-30 NOTE — Progress Notes (Signed)
   Subjective:    Patient ID: Casey Daniels, male    DOB: [birth year], 42 y.o.   MRN: [id number]  HPI Pt presents to the office today with swollen and tender testiculars that he noticed last week. States he has been with 3-4 different women over the last 6 months. He reports he has been masturbating "a lot" too.   States he is having a white discharge from his penis and itching. Denies any lesions.    Review of Systems  Genitourinary: Positive for scrotal swelling.  All other systems reviewed and are negative.      Objective:   Physical Exam  Constitutional: He is oriented to person, place, and time. He appears well-developed and well-nourished. No distress.  HENT:  Head: Normocephalic.  Eyes: Pupils are equal, round, and reactive to light. Right eye exhibits no discharge. Left eye exhibits no discharge.  Neck: Normal range of motion. Neck supple. No thyromegaly present.  Cardiovascular: Normal rate, regular rhythm, normal heart sounds and intact distal pulses.  No murmur heard. Pulmonary/Chest: Effort normal and breath sounds normal. No respiratory distress. He has no wheezes.  Abdominal: Soft. Bowel sounds are normal. He exhibits no distension. There is no tenderness.  Genitourinary: Penis normal. Right testis shows swelling and tenderness. Left testis shows swelling (2+ swelling left testes) and tenderness.  Musculoskeletal: Normal range of motion. He exhibits no edema or tenderness.  Neurological: He is alert and oriented to person, place, and time. No cranial nerve deficit.  Skin: Skin is warm and dry. No rash noted. No erythema.  Psychiatric: He has a normal mood and affect. His behavior is normal. Judgment and thought content normal.  Vitals reviewed.    Blood pressure 116/70, pulse 74, temperature (!) 97.4 F (36.3 C), temperature source Oral, height 6' (1.829 m), weight 219 lb (99.3 kg).      Assessment & Plan:  1. Acute epididymitis No sex until results  return Will treat for STD Safe sex discussed  RTO in 2 weeks - Urinalysis, Complete - Urine Culture - C. trachomatis/N. gonorrhoeae RNA - STD Screen (8) - CMP14+EGFR - cefTRIAXone (ROCEPHIN) injection 250 mg - azithromycin (ZITHROMAX) 500 MG tablet; Take 2 tablets (1,000 mg total) by mouth daily.  Dispense: 2 tablet; Refill: 0  2. Scrotal swelling - Urinalysis, Complete - Urine Culture - C. trachomatis/N. gonorrhoeae RNA - STD Screen (8) - CMP14+EGFR - cefTRIAXone (ROCEPHIN) injection 250 mg - azithromycin (ZITHROMAX) 500 MG tablet; Take 2 tablets (1,000 mg total) by mouth daily.  Dispense: 2 tablet; Refill: 0    Evelina Dun, FNP

## 2017-04-30 NOTE — Patient Instructions (Signed)
Epididymitis Epididymitis is swelling (inflammation) of the epididymis. The epididymis is a cord-like structure that is located along the top and back part of the testicle. It collects and stores sperm from the testicle. This condition can also cause pain and swelling of the testicle and scrotum. Symptoms usually start suddenly (acute epididymitis). Sometimes epididymitis starts gradually and lasts for a while (chronic epididymitis). This type may be harder to treat. What are the causes? In men 35 and younger, this condition is usually caused by a bacterial infection or sexually transmitted disease (STD), such as:  Gonorrhea.  Chlamydia.  In men 35 and older who do not have anal sex, this condition is usually caused by bacteria from a blockage or abnormalities in the urinary system. These can result from:  Having a tube placed into the bladder (urinary catheter).  Having an enlarged or inflamed prostate gland.  Having recent urinary tract surgery.  In men who have a condition that weakens the body's defense system (immune system), such as HIV, this condition can be caused by:  Other bacteria, including tuberculosis and syphilis.  Viruses.  Fungi.  Sometimes this condition occurs without infection. That may happen if urine flows backward into the epididymis after heavy lifting or straining. What increases the risk? This condition is more likely to develop in men:  Who have unprotected sex with more than one partner.  Who have anal sex.  Who have recently had surgery.  Who have a urinary catheter.  Who have urinary problems.  Who have a suppressed immune system.  What are the signs or symptoms? This condition usually begins suddenly with chills, fever, and pain behind the scrotum and in the testicle. Other symptoms include:  Swelling of the scrotum, testicle, or both.  Pain whenejaculatingor urinating.  Pain in the back or belly.  Nausea.  Itching and discharge  from the penis.  Frequent need to pass urine.  Redness and tenderness of the scrotum.  How is this diagnosed? Your health care provider can diagnose this condition based on your symptoms and medical history. Your health care provider will also do a physical exam to ask about your symptoms and check your scrotum and testicle for swelling, pain, and redness. You may also have other tests, including:  Examination of discharge from the penis.  Urine tests for infections, such as STDs.  Your health care provider may test you for other STDs, including HIV. How is this treated? Treatment for this condition depends on the cause. If your condition is caused by a bacterial infection, oral antibiotic medicine may be prescribed. If the bacterial infection has spread to your blood, you may need to receive IV antibiotics. Nonbacterial epididymitis is treated with home care that includes bed rest and elevation of the scrotum. Surgery may be needed to treat:  Bacterial epididymitis that causes pus to build up in the scrotum (abscess).  Chronic epididymitis that has not responded to other treatments.  Follow these instructions at home: Medicines  Take over-the-counter and prescription medicines only as told by your health care provider.  If you were prescribed an antibiotic medicine, take it as told by your health care provider. Do not stop taking the antibiotic even if your condition improves. Sexual Activity  If your epididymitis was caused by an STD, avoid sexual activity until your treatment is complete.  Inform your sexual partner or partners if you test positive for an STD. They may need to be treated.Do not engage in sexual activity with your partner or   partners until their treatment is completed. General instructions  Return to your normal activities as told by your health care provider. Ask your health care provider what activities are safe for you.  Keep your scrotum elevated and  supported while resting. Ask your health care provider if you should wear a scrotal support, such as a jockstrap. Wear it as told by your health care provider.  If directed, apply ice to the affected area: ? Put ice in a plastic bag. ? Place a towel between your skin and the bag. ? Leave the ice on for 20 minutes, 2-3 times per day.  Try taking a sitz bath to help with discomfort. This is a warm water bath that is taken while you are sitting down. The water should only come up to your hips and should cover your buttocks. Do this 3-4 times per day or as told by your health care provider.  Keep all follow-up visits as told by your health care provider. This is important. Contact a health care provider if:  You have a fever.  Your pain medicine is not helping.  Your pain is getting worse.  Your symptoms do not improve within three days. This information is not intended to replace advice given to you by your health care provider. Make sure you discuss any questions you have with your health care provider. Document Released: 03/17/2000 Document Revised: 08/26/2015 Document Reviewed: 08/05/2014 Elsevier Interactive Patient Education  2018 [employer]  

## 2017-04-30 NOTE — Addendum Note (Signed)
Addended by: Evelina Dun A on: 04/30/2017 09:34 AM   Modules accepted: Orders

## 2017-05-01 LAB — STD SCREEN (8)
HEP A IGM: NEGATIVE
HEP B S AG: NEGATIVE
HIV Screen 4th Generation wRfx: NONREACTIVE
HSV 1 Glycoprotein G Ab, IgG: 41.3 index — ABNORMAL HIGH (ref 0.00–0.90)
Hep B C IgM: NEGATIVE
RPR Ser Ql: NONREACTIVE

## 2017-05-01 LAB — URINE CULTURE: ORGANISM ID, BACTERIA: NO GROWTH

## 2017-05-03 LAB — CHLAMYDIA/GONOCOCCUS/TRICHOMONAS, NAA
Chlamydia by NAA: NEGATIVE
Gonococcus by NAA: NEGATIVE
TRICH VAG BY NAA: NEGATIVE

## 2017-05-05 ENCOUNTER — Telehealth: Payer: Self-pay | Admitting: Family

## 2017-05-07 NOTE — Telephone Encounter (Signed)
Left message to call back for results

## 2017-05-17 ENCOUNTER — Ambulatory Visit (HOSPITAL_COMMUNITY)
Admission: RE | Admit: 2017-05-17 | Discharge: 2017-05-17 | Disposition: A | Discharge status: Home / Self Care | Payer: Medicare Other | Source: Ambulatory Visit | Attending: Family | Admitting: Family

## 2017-05-17 ENCOUNTER — Other Ambulatory Visit: Payer: Self-pay | Admitting: Family

## 2017-05-17 ENCOUNTER — Ambulatory Visit (INDEPENDENT_AMBULATORY_CARE_PROVIDER_SITE_OTHER): Payer: Medicare Other | Admitting: Family

## 2017-05-17 ENCOUNTER — Encounter: Payer: Self-pay | Admitting: Family

## 2017-05-17 VITALS — BP 128/80 | HR 92 | Temp 97.3°F | Ht 72.0 in | Wt 222.0 lb

## 2017-05-17 DIAGNOSIS — J4540 Moderate persistent asthma, uncomplicated: Secondary | ICD-10-CM

## 2017-05-17 DIAGNOSIS — F17200 Nicotine dependence, unspecified, uncomplicated: Secondary | ICD-10-CM | POA: Diagnosis not present

## 2017-05-17 DIAGNOSIS — J4541 Moderate persistent asthma with (acute) exacerbation: Secondary | ICD-10-CM | POA: Diagnosis not present

## 2017-05-17 DIAGNOSIS — N5089 Other specified disorders of the male genital organs: Secondary | ICD-10-CM

## 2017-05-17 DIAGNOSIS — N433 Hydrocele, unspecified: Secondary | ICD-10-CM | POA: Insufficient documentation

## 2017-05-17 DIAGNOSIS — Z87891 Personal history of nicotine dependence: Secondary | ICD-10-CM | POA: Insufficient documentation

## 2017-05-17 MED ORDER — BUDESONIDE-FORMOTEROL FUMARATE 80-4.5 MCG/ACT IN AERO: 2.0000 | INHALATION_SPRAY | Freq: Two times a day (BID) | RESPIRATORY_TRACT | 3 refills | Status: DC

## 2017-05-17 MED ORDER — DOXYCYCLINE HYCLATE 100 MG PO TABS: 100.0000 mg | ORAL_TABLET | Freq: Two times a day (BID) | ORAL | 0 refills | Status: DC

## 2017-05-17 MED ORDER — CEFTRIAXONE SODIUM 1 G IJ SOLR
1.0000 g | Freq: Once | INTRAMUSCULAR | Status: AC
  Administered 2017-05-17: 1 g via INTRAMUSCULAR

## 2017-05-17 MED ORDER — PREDNISONE 10 MG (21) PO TBPK: ORAL_TABLET | ORAL | 0 refills | Status: DC

## 2017-05-17 NOTE — Progress Notes (Signed)
   Subjective:    Patient ID: Casey Daniels, male    DOB: 10-Jul-1975, 42 y.o.   MRN: [id number]  Pt presents to the office today to recheck testicular swelling. Pt was seen on 04/30/17 with swollen, tender testes and complaining of white discharge of his penis.   Pt was treated with Rocephin and Azithromycin. His STD panel was negative.   He reports the tenderness and swelling is greatly improved, but is "swollen a little".  Asthma  He complains of cough, frequent throat clearing and wheezing. His symptoms are aggravated by exposure to smoke. His past medical history is significant for asthma.      Review of Systems  Respiratory: Positive for cough and wheezing.   All other systems reviewed and are negative.      Objective:   Physical Exam  Constitutional: He is oriented to person, place, and time. He appears well-developed and well-nourished. No distress.  HENT:  Head: Normocephalic.  Eyes: Pupils are equal, round, and reactive to light. Right eye exhibits no discharge. Left eye exhibits no discharge.  Neck: Normal range of motion. Neck supple. No thyromegaly present.  Cardiovascular: Normal rate, regular rhythm, normal heart sounds and intact distal pulses.  No murmur heard. Pulmonary/Chest: Effort normal and breath sounds normal. No respiratory distress. He has no wheezes.  Abdominal: Soft. Bowel sounds are normal. He exhibits no distension. There is no tenderness.  Genitourinary: Left testis shows swelling and tenderness.  Musculoskeletal: Normal range of motion. He exhibits no edema or tenderness.  Neurological: He is alert and oriented to person, place, and time. He has normal reflexes. No cranial nerve deficit.  Skin: Skin is warm and dry. No rash noted. No erythema.  Psychiatric: He has a normal mood and affect. His behavior is normal. Judgment and thought content normal.  Vitals reviewed.     BP 128/80   Pulse 92   Temp (!) 97.3 F (36.3 C) (Oral)   Ht 6'  (1.829 m)   Wt 222 lb (100.7 kg)   BMI 30.11 kg/m      Assessment & Plan:  1. Moderate persistent asthma with acute exacerbation Smoking cessation discussed! Start Symbicort BID - predniSONE (STERAPRED UNI-PAK 21 TAB) 10 MG (21) TBPK tablet; Use as directed  Dispense: 21 tablet; Refill: 0 - budesonide-formoterol (SYMBICORT) 80-4.5 MCG/ACT inhaler; Inhale 2 puffs into the lungs 2 (two) times daily.  Dispense: 1 Inhaler; Refill: 3  2. Current smoker  3. Testicular swelling, left [place] pending - US Scrotum; Future - [place] Art/Ven Flow Abd Pelv Doppler; Future  4. Moderate persistent asthma without complication - predniSONE (STERAPRED UNI-PAK 21 TAB) 10 MG (21) TBPK tablet; Use as directed  Dispense: 21 tablet; Refill: 0 - budesonide-formoterol (SYMBICORT) 80-4.5 MCG/ACT inhaler; Inhale 2 puffs into the lungs 2 (two) times daily.  Dispense: 1 Inhaler; Refill: [place], FNP

## 2017-05-17 NOTE — Patient Instructions (Signed)
Hydrocele, Adult A hydrocele is a collection of fluid in the loose pouch of skin that holds the testicles (scrotum). Usually, it affects only one testicle. What are the causes? This condition may be caused by:  An injury to the scrotum.  An infection.  A tumor or cancer of the testicle.  Twisting of a testicle.  Decreased blood flow to the scrotum.  What are the signs or symptoms? A hydrocele feels like a water-filled balloon. It may also feel heavy. A hydrocele can cause:  Swelling of the scrotum. The swelling may decrease when you lie down.  Swelling of the groin.  Mild discomfort in the scrotum.  Pain. This can develop if the hydrocele was caused by infection or twisting.  How is this diagnosed? This condition may be diagnosed with a medical history, physical exam, and imaging tests. You may also have blood and urine tests to check for infection. How is this treated? Treatment may include:  Watching and waiting, particularly if the hydrocele causes no symptoms.  Treatment of the underlying condition. This may include using antibiotic medicine.  Surgery to drain the fluid. Some surgical options include: ? Needle aspiration. For this procedure, a needle is used to drain fluid. ? Hydrocelectomy. For this procedure, an incision is made in the scrotum to remove the fluid sac.  Follow these instructions at home:  Keep all follow-up visits as told by your health care provider. This is important.  Watch the hydrocele for any changes.  Take over-the-counter and prescription medicines only as told by your health care provider.  If you were prescribed an antibiotic medicine, use it as told by your health care provider. Do not stop using the antibiotic even if your condition improves. Contact a health care provider if:  The swelling in your scrotum or groin gets worse.  The hydrocele becomes red, firm, tender to the touch, or painful.  You notice any changes in the  hydrocele.  You have a fever. This information is not intended to replace advice given to you by your health care provider. Make sure you discuss any questions you have with your health care provider. Document Released: 09/07/2009 Document Revised: 08/26/2015 Document Reviewed: 03/16/2014 Elsevier Interactive Patient Education  2018 [employer] Inc[employer]  

## 2017-05-17 NOTE — Addendum Note (Signed)
Addended by: Evelina Dun A on: 05/17/2017 05:07 PM   Modules accepted: Orders

## 2017-05-21 ENCOUNTER — Other Ambulatory Visit: Payer: Self-pay | Admitting: Family

## 2017-05-21 NOTE — Progress Notes (Signed)
Does this patient have an appt for Urology yet?

## 2017-05-24 ENCOUNTER — Other Ambulatory Visit: Payer: Self-pay | Admitting: Family

## 2017-05-31 ENCOUNTER — Ambulatory Visit: Payer: Medicare Other | Admitting: Family

## 2017-06-07 ENCOUNTER — Telehealth: Payer: Self-pay | Admitting: *Deleted

## 2017-06-07 NOTE — Telephone Encounter (Signed)
Patient given phone number of [facility] so he can call to schedule a new appointment.

## 2017-09-24 ENCOUNTER — Ambulatory Visit (INDEPENDENT_AMBULATORY_CARE_PROVIDER_SITE_OTHER): Payer: Medicare Other | Admitting: Family

## 2017-09-24 ENCOUNTER — Encounter: Payer: Self-pay | Admitting: Family

## 2017-09-24 VITALS — BP 119/78 | HR 88 | Temp 97.9°F | Ht 72.0 in | Wt 222.3 lb

## 2017-09-24 DIAGNOSIS — L0211 Cutaneous abscess of neck: Secondary | ICD-10-CM | POA: Diagnosis not present

## 2017-09-24 DIAGNOSIS — L02419 Cutaneous abscess of limb, unspecified: Secondary | ICD-10-CM | POA: Diagnosis not present

## 2017-09-24 MED ORDER — CEFTRIAXONE SODIUM 1 G IJ SOLR
1.0000 g | Freq: Once | INTRAMUSCULAR | Status: AC
  Administered 2017-09-24: 1 g via INTRAMUSCULAR

## 2017-09-24 MED ORDER — SULFAMETHOXAZOLE-TRIMETHOPRIM 800-160 MG PO TABS: 1.0000 | ORAL_TABLET | Freq: Two times a day (BID) | ORAL | 0 refills | Status: DC

## 2017-09-24 NOTE — Patient Instructions (Signed)

## 2017-09-24 NOTE — Progress Notes (Addendum)
   Subjective:    Patient ID: DELVECCHIO MADOLE, male    DOB: 1976-03-09, 42 y.o.   MRN: [id number]  Chief Complaint  Patient presents with  . cyst on neck and axillary    HPI Pt presents to the office today with an abscess on left neck and left axillary. States he noticed them both yesterday, but have progessively become worse.   He reports the abscess on his neck is draining "blood and pus".    Review of Systems  Skin: Positive for wound.  All other systems reviewed and are negative.      Objective:   Physical Exam  Constitutional: He is oriented to person, place, and time. He appears well-developed and well-nourished. No distress.  HENT:  Head: Normocephalic.  Eyes: Right eye exhibits no discharge. Left eye exhibits no discharge.  Cardiovascular: Normal rate, regular rhythm, normal heart sounds and intact distal pulses.  No murmur heard. Pulmonary/Chest: Effort normal and breath sounds normal. No respiratory distress. He has no wheezes.  Musculoskeletal: Normal range of motion. He exhibits no edema or tenderness.  Neurological: He is alert and oriented to person, place, and time. He has normal reflexes. No cranial nerve deficit.  Skin: Skin is warm and dry. Rash noted. Rash is pustular (hard nodule on left neck approx 3.5X2.5 cm and left axiallary 5X1cm). No erythema.     Psychiatric: He has a normal mood and affect. His behavior is normal. Judgment and thought content normal.  Vitals reviewed.  Area cleaned and small incision made. Sanguinous discharge. Area hard and tender.    BP 119/78   Pulse 88   Temp 97.9 F (36.6 C) (Oral)   Ht 6' (1.829 m)   Wt 222 lb 4.8 oz (100.8 kg)   BMI 30.15 kg/m      Assessment & Plan:  Myrle was seen today for cyst on neck and axillary.  Diagnoses and all orders for this visit:  Abscess of axilla -     cefTRIAXone (ROCEPHIN) injection 1 g -     Ambulatory referral to Dermatology -     sulfamethoxazole-trimethoprim (BACTRIM  DS) 800-160 MG tablet; Take 1 tablet by mouth 2 (two) times daily.  Abscess, neck -     cefTRIAXone (ROCEPHIN) injection 1 g -     Ambulatory referral to Dermatology -     sulfamethoxazole-trimethoprim (BACTRIM DS) 800-160 MG tablet; Take 1 tablet by mouth 2 (two) times daily.   Warm compresses Let drain Will do referral to derm RTO if symptoms worsen or do not improve  Evelina Dun, FNP

## 2017-11-13 ENCOUNTER — Telehealth: Payer: Self-pay | Admitting: Family

## 2017-11-13 DIAGNOSIS — K921 Melena: Secondary | ICD-10-CM

## 2017-11-13 NOTE — Telephone Encounter (Signed)
Order placed, please tell him to come and get FOBT and return.

## 2017-12-19 ENCOUNTER — Other Ambulatory Visit: Payer: Self-pay | Admitting: Family

## 2017-12-19 DIAGNOSIS — J4540 Moderate persistent asthma, uncomplicated: Secondary | ICD-10-CM

## 2017-12-19 DIAGNOSIS — J4541 Moderate persistent asthma with (acute) exacerbation: Secondary | ICD-10-CM

## 2018-02-19 ENCOUNTER — Encounter: Payer: Self-pay | Admitting: Family

## 2018-02-19 ENCOUNTER — Ambulatory Visit (INDEPENDENT_AMBULATORY_CARE_PROVIDER_SITE_OTHER): Payer: Medicare Other | Admitting: Family

## 2018-02-19 VITALS — BP 124/87 | HR 88 | Temp 97.9°F | Ht 72.0 in | Wt 222.8 lb

## 2018-02-19 DIAGNOSIS — Z23 Encounter for immunization: Secondary | ICD-10-CM

## 2018-02-19 DIAGNOSIS — L0211 Cutaneous abscess of neck: Secondary | ICD-10-CM

## 2018-02-19 DIAGNOSIS — L0291 Cutaneous abscess, unspecified: Secondary | ICD-10-CM

## 2018-02-19 DIAGNOSIS — J4540 Moderate persistent asthma, uncomplicated: Secondary | ICD-10-CM

## 2018-02-19 MED ORDER — SULFAMETHOXAZOLE-TRIMETHOPRIM 800-160 MG PO TABS: 1.0000 | ORAL_TABLET | Freq: Two times a day (BID) | ORAL | 0 refills | Status: DC

## 2018-02-19 MED ORDER — BUDESONIDE-FORMOTEROL FUMARATE 80-4.5 MCG/ACT IN AERO: INHALATION_SPRAY | RESPIRATORY_TRACT | 2 refills | Status: DC

## 2018-02-19 MED ORDER — ALBUTEROL SULFATE (2.5 MG/3ML) 0.083% IN NEBU: INHALATION_SOLUTION | RESPIRATORY_TRACT | 2 refills | Status: DC

## 2018-02-19 NOTE — Addendum Note (Signed)
Addended by: Evelina Dun A on: 02/19/2018 04:14 PM   Modules accepted: Orders

## 2018-02-19 NOTE — Patient Instructions (Signed)
Skin Abscess A skin abscess is an infected area on or under your skin that contains pus and other material. An abscess can happen almost anywhere on your body. Some abscesses break open (rupture) on their own. Most continue to get worse unless they are treated. The infection can spread deeper into the body and into your blood, which can make you feel sick. Treatment usually involves draining the abscess. Follow these instructions at home: Abscess Care  If you have an abscess that has not drained, place a warm, clean, wet washcloth over the abscess several times a day. Do this as told by your doctor.  Follow instructions from your doctor about how to take care of your abscess. Make sure you: ? Cover the abscess with a bandage (dressing). ? Change your bandage or gauze as told by your doctor. ? Wash your hands with soap and water before you change the bandage or gauze. If you cannot use soap and water, use hand sanitizer.  Check your abscess every day for signs that the infection is getting worse. Check for: ? More redness, swelling, or pain. ? More fluid or blood. ? Warmth. ? More pus or a bad smell. Medicines   Take over-the-counter and prescription medicines only as told by your doctor.  If you were prescribed an antibiotic medicine, take it as told by your doctor. Do not stop taking the antibiotic even if you start to feel better. General instructions  To avoid spreading the infection: ? Do not share personal care items, towels, or hot tubs with others. ? Avoid making skin-to-skin contact with other people.  Keep all follow-up visits as told by your doctor. This is important. Contact a doctor if:  You have more redness, swelling, or pain around your abscess.  You have more fluid or blood coming from your abscess.  Your abscess feels warm when you touch it.  You have more pus or a bad smell coming from your abscess.  You have a fever.  Your muscles ache.  You have  chills.  You feel sick. Get help right away if:  You have very bad (severe) pain.  You see red streaks on your skin spreading away from the abscess. This information is not intended to replace advice given to you by your health care provider. Make sure you discuss any questions you have with your health care provider. Document Released: 09/06/2007 Document Revised: 11/14/2015 Document Reviewed: 01/27/2015 Elsevier Interactive Patient Education  2018 [employer] Inc[employer]  

## 2018-02-19 NOTE — Progress Notes (Addendum)
   Subjective:    Patient ID: Casey Daniels, male    DOB: 12-22-1975, 42 y.o.   MRN: [id number]  Chief Complaint  Patient presents with  . cyst on neck    HPI Pt presents to the office today with an abscess on his right neck that he noticed about 4-5 days ago. He states he thought it would go away, but has not.   Reports intermittent soreness of 6 out 10. He states he squeezed it once and had a bloody, yellow discharge. States this helped with the pain.    Requesting refill on Symbicort for his asthma. States he is wheezing, coughing, and having SOB more frequent since the weather changing.    Review of Systems  Skin: Positive for wound.  All other systems reviewed and are negative.      Objective:   Physical Exam  Constitutional: He is oriented to person, place, and time. He appears well-developed and well-nourished. No distress.  HENT:  Head: Normocephalic.  Eyes: Right eye exhibits no discharge. Left eye exhibits no discharge.  Neck: No thyromegaly present.  Cardiovascular: Normal rate, regular rhythm, normal heart sounds and intact distal pulses.  No murmur heard. Pulmonary/Chest: Effort normal and breath sounds normal. No respiratory distress. He has no wheezes.  Abdominal: Soft. Bowel sounds are normal. He exhibits no distension. There is no tenderness.  Musculoskeletal: Normal range of motion. He exhibits no edema or tenderness.  Neurological: He is alert and oriented to person, place, and time. He has normal reflexes. No cranial nerve deficit.  Skin: Skin is warm and dry. Rash noted. Rash is pustular (abscess present). No erythema.     Psychiatric: He has a normal mood and affect. His behavior is normal. Judgment and thought content normal.  Vitals reviewed.  Area cleaned with betadine. Small incision made. Sanguineous discharge. Tolerated well.   BP 124/87   Pulse 88   Temp 97.9 F (36.6 C) (Oral)   Ht 6' (1.829 m)   Wt 222 lb 12.8 oz (101.1 kg)   BMI  30.22 kg/m       Assessment & Plan:  RAMADAN COUEY comes in today with chief complaint of cyst on neck   Diagnosis and orders addressed:  1. Need for immunization against influenza - Flu Vaccine QUAD 36+ mos IM  2. Abscess Keep clean and dry Warm compresses Bathe with chlorhexidine wash daily RTO if symptoms worsen or do not improve  - sulfamethoxazole-trimethoprim (BACTRIM DS) 800-160 MG tablet; Take 1 tablet by mouth 2 (two) times daily.  Dispense: 20 tablet; Refill: 0  3. Moderate persistent asthma without complication - albuterol (PROVENTIL) (2.5 MG/3ML) 0.083% nebulizer solution; USE ONE vial in nebulizer every SIX hours as needed FOR wheezing OR shortness of breath  Dispense: 150 mL; Refill: 2 - budesonide-formoterol (SYMBICORT) 80-4.5 MCG/ACT inhaler; Inhale 2 puffs into the lungs 2 (two) times daily.  Dispense: 10.2 g; Refill: 2    Evelina Dun, FNP

## 2018-03-11 ENCOUNTER — Encounter: Payer: Medicare Other | Admitting: *Deleted

## 2018-03-13 ENCOUNTER — Ambulatory Visit (INDEPENDENT_AMBULATORY_CARE_PROVIDER_SITE_OTHER): Payer: Medicare Other

## 2018-03-13 VITALS — BP 130/83 | HR 87 | Temp 98.0°F | Ht 72.0 in | Wt 222.0 lb

## 2018-03-13 DIAGNOSIS — Z0000 Encounter for general adult medical examination without abnormal findings: Secondary | ICD-10-CM | POA: Diagnosis not present

## 2018-03-13 DIAGNOSIS — J4540 Moderate persistent asthma, uncomplicated: Secondary | ICD-10-CM

## 2018-03-13 MED ORDER — BUDESONIDE-FORMOTEROL FUMARATE 80-4.5 MCG/ACT IN AERO: INHALATION_SPRAY | RESPIRATORY_TRACT | 2 refills | Status: DC

## 2018-03-13 NOTE — Progress Notes (Signed)
Subjective:   KAILAN LAWS is a 42 y.o. male who presents for Medicare Annual/Subsequent preventive examination.  Review of Systems:   Mr. Ksiazek is here today for his Medicare Annual Wellness Visit. He lives here in [place] and works at a [employer].  He does not have a car so he either walks to work or rides a bicycle.  He enjoys this and reports he gets a lot of exercise by doing this.  He has quit smoking this week and plans to continue on this path.  His mom and one brother also live here and he is close with both of them.   Cardiac Risk Factors include: none     Objective:    Vitals: BP 130/83   Pulse 87   Temp 98 F (36.7 C) (Oral)   Ht 6' (1.829 m)   Wt 222 lb (100.7 kg)   BMI 30.11 kg/m   Body mass index is 30.11 kg/m.  Advanced Directives 03/13/2018 02/26/2017  Does Patient Have a Medical Advance Directive? No No  Would patient like information on creating a medical advance directive? No - Patient declined -    Tobacco Social History   Tobacco Use  Smoking Status Former Smoker  . Packs/day: 0.25  . Years: 15.00  . Pack years: 3.75  . Types: Cigarettes  . Last attempt to quit: 03/11/2018  Smokeless Tobacco Never Used  Tobacco Comment   quits off and on     Patient was advised if he needs any help to continue not smoking we will be glad to make an appointment with his PCP to discuss this.  He declines appointment at this time but will call us if he feels this is needed.  Clinical Intake:    Past Medical History:  Diagnosis Date  . Arthritis    bilateral ankles due to club foot release as an infant  . Asthma    Past Surgical History:  Procedure Laterality Date  . CLUB FOOT RELEASE Bilateral    Family History  Problem Relation Age of Onset  . Diabetes Mother   . Healthy Brother    Social History   Socioeconomic History  . Marital status: Single    Spouse name: Not on file  . Number of children: 0  . Years  of education: Not on file  . Highest education level: Not on file  Occupational History  . Occupation: unemployed    Comment: disabled  Social Needs  . Financial resource strain: Not very hard  . Food insecurity:    Worry: Never true    Inability: Never true  . Transportation needs:    Medical: No    Non-medical: No  Tobacco Use  . Smoking status: Former Smoker    Packs/day: 0.25    Years: 15.00    Pack years: 3.75    Types: Cigarettes    Last attempt to quit: 03/11/2018  . Smokeless tobacco: Never Used  . Tobacco comment: quits off and on  Substance and Sexual Activity  . Alcohol use: No    Frequency: Never    Comment: No alcohol since 12/2016  . Drug use: Yes    Types: Marijuana  . Sexual activity: Not on file  Lifestyle  . Physical activity:    Days per week: 7 days    Minutes per session: 60 min  . Stress: Only a little  Relationships  . Social connections:    Talks on phone: Not on  file    Gets together: Not on file    Attends religious service: Not on file    Active member of club or organization: Not on file    Attends meetings of clubs or organizations: Not on file    Relationship status: Not on file  Other Topics Concern  . Not on file  Social History Narrative  . Not on file    Outpatient Encounter Medications as of 03/13/2018  Medication Sig  . albuterol (PROVENTIL) (2.5 MG/3ML) 0.083% nebulizer solution USE ONE vial in nebulizer every SIX hours as needed FOR wheezing OR shortness of breath  . budesonide-formoterol (SYMBICORT) 80-4.5 MCG/ACT inhaler Inhale 2 puffs into the lungs 2 (two) times daily.  . [DISCONTINUED] budesonide-formoterol (SYMBICORT) 80-4.5 MCG/ACT inhaler Inhale 2 puffs into the lungs 2 (two) times daily.  . [DISCONTINUED] sulfamethoxazole-trimethoprim (BACTRIM DS) 800-160 MG tablet Take 1 tablet by mouth 2 (two) times daily.   No facility-administered encounter medications on file as of 03/13/2018.     Activities of Daily  Living In your present state of health, do you have any difficulty performing the following activities: 03/13/2018  Hearing? N  Vision? N  Difficulty concentrating or making decisions? N  Walking or climbing stairs? N  Dressing or bathing? N  Doing errands, shopping? N  Preparing Food and eating ? N  Using the Toilet? N  In the past six months, have you accidently leaked urine? N  Do you have problems with loss of bowel control? N  Managing your Medications? N  Managing your Finances? N  Housekeeping or managing your Housekeeping? N  Some recent data might be hidden    Patient Care Team: Sharion Balloon, FNP as PCP - General (Family Medicine)   Assessment:   This is a routine wellness examination for Dainel.  Exercise Activities and Dietary recommendations Current Exercise Habits: Home exercise routine, Type of exercise: walking, Time (Minutes): 60, Frequency (Times/Week): 6, Weekly Exercise (Minutes/Week): 360, Intensity: Moderate  Goals    . DIET - INCREASE WATER INTAKE    . DIET - REDUCE FAT INTAKE       Fall Risk Fall Risk  09/24/2017 05/17/2017 02/26/2017 11/15/2016  Falls in the past year? No No No No   Is the patient's home free of loose throw rugs in walkways, pet beds, electrical cords, etc?   Yes      Grab bars in the bathroom? No      Handrails on the stairs?   Yes      Adequate lighting?   Yes   Depression Screen PHQ 2/9 Scores 03/13/2018 02/19/2018 09/24/2017 05/17/2017  PHQ - 2 Score 0 0 3 0  PHQ- 9 Score - - 8 -    Cognitive Function MMSE - Mini Mental State Exam 03/13/2018 02/26/2017  Not completed: - Unable to complete  Orientation to time 5 -  Orientation to Place 5 -  Registration 3 -  Attention/ Calculation 5 -  Recall 3 -  Language- name 2 objects 2 -  Language- repeat 1 -  Language- follow 3 step command 3 -  Language- read & follow direction 1 -  Write a sentence 1 -  Copy design 1 -  Total score 30 -    Tyrece scored well on his MMSE,  scoring 30 out of 30 available points.  Immunization History  Administered Date(s) Administered  . Influenza,inj,Quad PF,6+ Mos 01/29/2017, 02/19/2018  . Influenza,trivalent, recombinat, inj, PF 04/17/2015  . Tdap 02/19/2015  Qualifies for Shingles Vaccine? No  Screening Tests Health Maintenance  Topic Date Due  . TETANUS/TDAP  02/18/2025  . INFLUENZA VACCINE  Completed  . HIV Screening  Completed   Cancer Screenings: Lung: Low Dose CT Chest recommended if Age 16-80 years, 30 pack-year currently smoking OR have quit w/in 15years. Patient does notqualify. Colorectal: Not qualified  Additional Screenings:  Hepatitis C Screening:      Plan:   Follow up with PCP as needed.  I have personally reviewed and noted the following in the patient's chart:   . Medical and social history . Use of alcohol, tobacco or illicit drugs  . Current medications and supplements . Functional ability and status . Nutritional status . Physical activity . Advanced directives . List of other physicians . Hospitalizations, surgeries, and ER visits in previous 12 months . Vitals . Screenings to include cognitive, depression, and falls . Referrals and appointments  In addition, I have reviewed and discussed with patient certain preventive protocols, quality metrics, and best practice recommendations. A written personalized care plan for preventive services as well as general preventive health recommendations were provided to patient.     Burnadette Pop, LPN  [id number]  I have reviewed and agree with the above AWV documentation.   Evelina Dun, FNP

## 2018-03-13 NOTE — Patient Instructions (Signed)
  Mr. Buckle , Thank you for taking time to come for your Medicare Wellness Visit. I appreciate your ongoing commitment to your health goals. Please review the following plan we discussed and let me know if I can assist you in the future.   These are the goals we discussed: Goals    . DIET - INCREASE WATER INTAKE    . DIET - REDUCE FAT INTAKE       This is a list of the screening recommended for you and due dates:  Health Maintenance  Topic Date Due  . Tetanus Vaccine  02/18/2025  . Flu Shot  Completed  . HIV Screening  Completed

## 2018-09-12 ENCOUNTER — Encounter (INDEPENDENT_AMBULATORY_CARE_PROVIDER_SITE_OTHER): Payer: Self-pay

## 2018-12-12 ENCOUNTER — Ambulatory Visit (INDEPENDENT_AMBULATORY_CARE_PROVIDER_SITE_OTHER): Payer: Medicare Other

## 2018-12-12 ENCOUNTER — Other Ambulatory Visit: Payer: Self-pay

## 2018-12-12 ENCOUNTER — Encounter: Payer: Self-pay | Admitting: Family Medicine

## 2018-12-12 ENCOUNTER — Ambulatory Visit (INDEPENDENT_AMBULATORY_CARE_PROVIDER_SITE_OTHER): Payer: Medicare Other | Admitting: Family Medicine

## 2018-12-12 VITALS — BP 130/86 | HR 77 | Temp 97.1°F | Resp 20 | Ht 72.0 in | Wt 235.0 lb

## 2018-12-12 DIAGNOSIS — M25562 Pain in left knee: Secondary | ICD-10-CM | POA: Diagnosis not present

## 2018-12-12 DIAGNOSIS — W19XXXA Unspecified fall, initial encounter: Secondary | ICD-10-CM

## 2018-12-12 DIAGNOSIS — S8992XA Unspecified injury of left lower leg, initial encounter: Secondary | ICD-10-CM | POA: Diagnosis not present

## 2018-12-12 MED ORDER — DICLOFENAC SODIUM 75 MG PO TBEC: 75.0000 mg | DELAYED_RELEASE_TABLET | Freq: Two times a day (BID) | ORAL | 2 refills | Status: DC

## 2018-12-12 NOTE — Progress Notes (Signed)
Chief Complaint  Patient presents with  . left knee pain    HPI  Patient presents today for falling and hurting his knee two days ago. Getting worse. Painful for ambulation  PMH: Smoking status noted ROS: Per HPI  Objective: BP 130/86   Pulse 77   Temp (!) 97.1 F (36.2 C)   Resp 20   Ht 6' (1.829 m)   Wt 235 lb (106.6 kg)   SpO2 99%   BMI 31.87 kg/m  Gen: NAD, alert, cooperative with exam HEENT: NCAT, EOMI, PERRL CV: RRR, good S1/S2, no murmur Resp: CTABL, no wheezes, non-labored Abd: SNTND, BS present, no guarding or organomegaly Ext: No edema, warm Knee Musculoskeletal Exam  Range of Motion    Range of Motion - Left      Active extension: 0     Passive extension: 0     Active flexion: 100     Passive flexion: 115  Strength    Strength - Left      Extension: 4/5     Extension: affected by pain     Flexion: 4/5     Flexion: affected by pain  Instability    Instability Signs - Left      Varus stress grade: 1+     Valgus stress grade: 1+     Anterior drawer: normal     Posterior drawer: normal     Medial McMurray test: negative     Lateral McMurray test: negative     Lachman: negative   Neuro: Alert and oriented, No gross deficits  Assessment and plan:  1. Left knee pain, unspecified chronicity   2. Fall, initial encounter     Meds ordered this encounter  Medications  . diclofenac (VOLTAREN) 75 MG EC tablet    Sig: Take 1 tablet (75 mg total) by mouth 2 (two) times daily. For muscle and  Joint pain    Dispense:  60 tablet    Refill:  2    Orders Placed This Encounter  Procedures  . DG Knee 1-2 Views Left    Standing Status:   Future    Number of Occurrences:   1    Standing Expiration Date:   02/11/2020    Order Specific Question:   Reason for Exam (SYMPTOM  OR DIAGNOSIS REQUIRED)    Answer:   left knee pain    Order Specific Question:   Preferred imaging location?    Answer:   Internal    Follow up as needed.  Claretta Fraise,  MD

## 2018-12-12 NOTE — Patient Instructions (Signed)
Wear hinged knee brace when up on feet for 2 weeks - until next appointment.

## 2018-12-26 ENCOUNTER — Ambulatory Visit: Payer: Medicare Other | Admitting: Family Medicine

## 2019-01-06 ENCOUNTER — Other Ambulatory Visit: Payer: Self-pay

## 2019-01-07 ENCOUNTER — Ambulatory Visit (INDEPENDENT_AMBULATORY_CARE_PROVIDER_SITE_OTHER): Payer: Medicare Other | Admitting: Family Medicine

## 2019-01-07 ENCOUNTER — Encounter: Payer: Self-pay | Admitting: Family Medicine

## 2019-01-07 ENCOUNTER — Other Ambulatory Visit: Payer: Self-pay

## 2019-01-07 VITALS — BP 120/82 | HR 82 | Temp 98.6°F | Resp 22 | Ht 72.0 in | Wt 235.0 lb

## 2019-01-07 DIAGNOSIS — Z23 Encounter for immunization: Secondary | ICD-10-CM

## 2019-01-07 DIAGNOSIS — M7052 Other bursitis of knee, left knee: Secondary | ICD-10-CM | POA: Diagnosis not present

## 2019-01-07 MED ORDER — PREDNISONE 10 MG PO TABS: ORAL_TABLET | ORAL | 0 refills | Status: DC

## 2019-01-07 NOTE — Progress Notes (Signed)
Chief Complaint  Patient presents with  . left knee pain    3 week follow up     HPI  Patient presents today for pain at knee increasing over the last few weeks. Pain reaches 7/10 when standing on it for prolonged periods.  PMH: Smoking status noted ROS: Per HPI  Objective: BP 120/82   Pulse 82   Temp 98.6 F (37 C)   Resp (!) 22   Ht 6' (1.829 m)   Wt 235 lb (106.6 kg)   SpO2 97%   BMI 31.87 kg/m  Gen: NAD, alert, cooperative with exam HEENT: NCAT, EOMI, PERRL CV: RRR, good S1/S2, no murmur Resp: CTABL, no wheezes, non-labored  Ext: No edema, warm. FROM Tender anteriorly over patella. Neuro: Alert and oriented, No gross deficits  Assessment and plan:  1. Infrapatellar bursitis of left knee   2. Need for immunization against influenza     Meds ordered this encounter  Medications  . predniSONE (DELTASONE) 10 MG tablet    Sig: Take 5 daily for 3 days followed by 4,3,2 and 1 for 3 days each.    Dispense:  45 tablet    Refill:  0    Orders Placed This Encounter  Procedures  . Flu Vaccine QUAD 36+ mos IM  . Ambulatory referral to Physical Therapy    Referral Priority:   Routine    Referral Type:   Physical Medicine    Referral Reason:   Specialty Services Required    Requested Specialty:   Physical Therapy    Number of Visits Requested:   1    Follow up as needed.  Claretta Fraise, MD

## 2019-01-13 ENCOUNTER — Ambulatory Visit: Payer: Medicare Other | Attending: Family Medicine | Admitting: Physical Therapy

## 2019-02-17 IMAGING — US US ART/VEN ABD/PELV/SCROTUM DOPPLER LTD
1 series · 13 of 25 positions shown · non-contrast
Comparison: None.

CLINICAL DATA: Testicular swelling for 16 days.

EXAM:
SCROTAL ULTRASOUND
DOPPLER ULTRASOUND OF THE TESTICLES
TECHNIQUE: Complete ultrasound examination of the testicles, epididymis, and
other scrotal structures was performed. Color and spectral Doppler
ultrasound were also utilized to evaluate blood flow to the
testicles.

[Series 1: us art/ven abd/pelv/scrotum doppler ltd · 0.06mm/px · 13 of 89 slices shown]
[im 1/89]
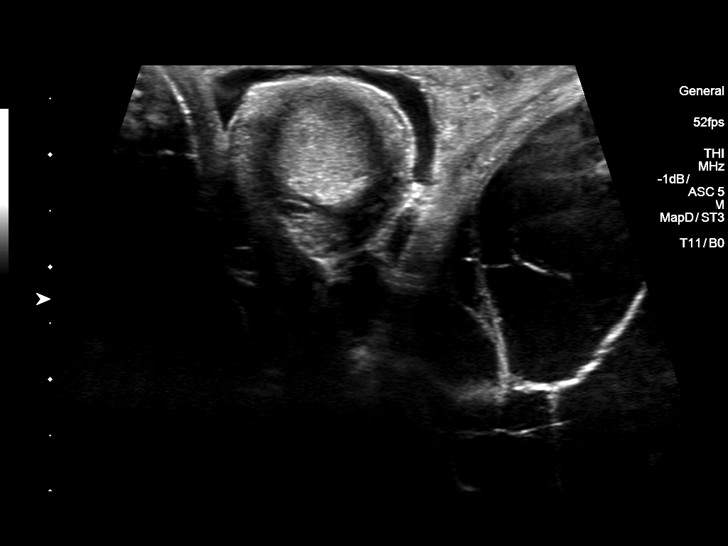
[im 8/89]
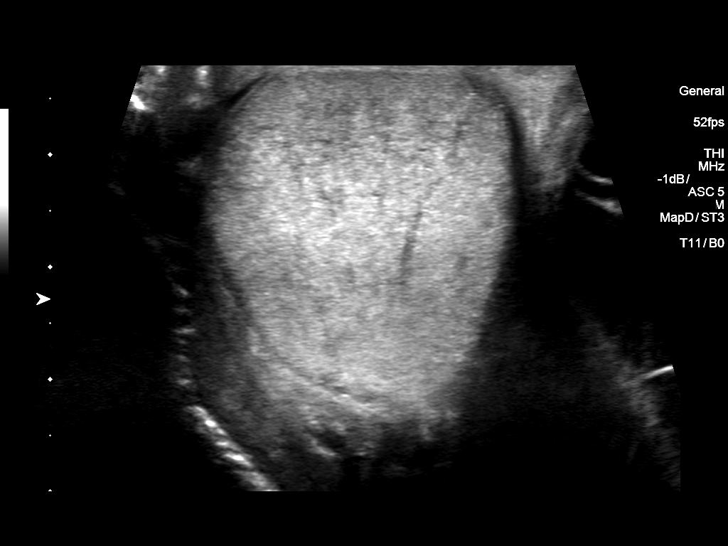
[im 15/89]
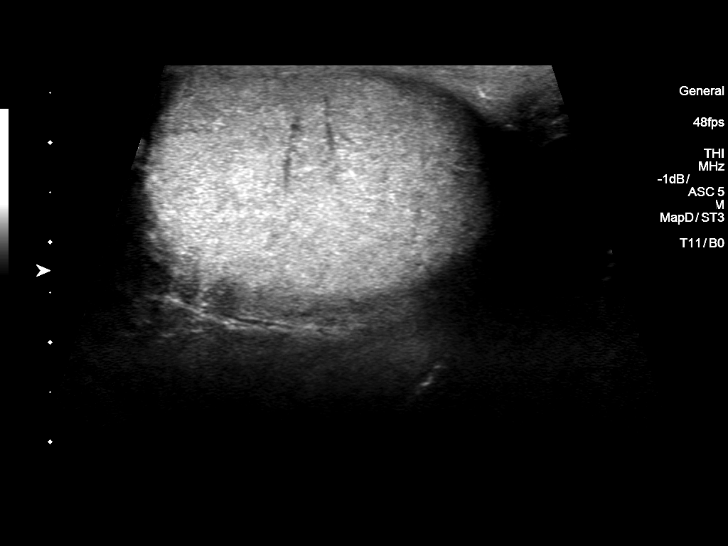
[im 23/89]
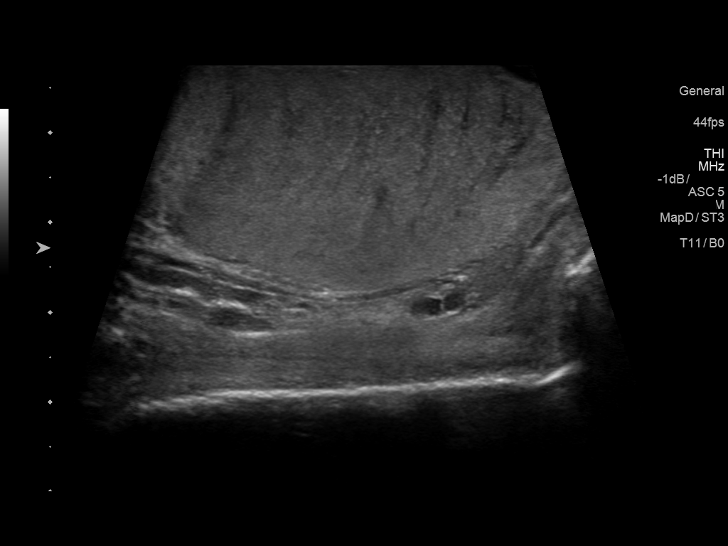
[im 30/89]
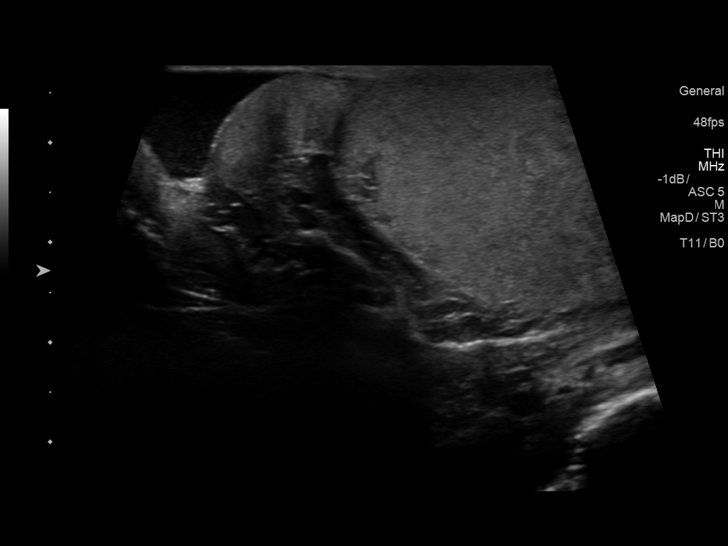
[im 37/89]
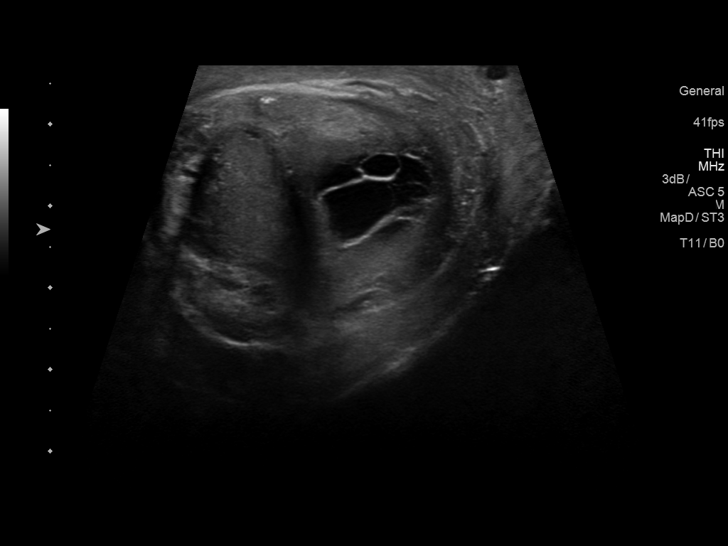
[im 45/89]
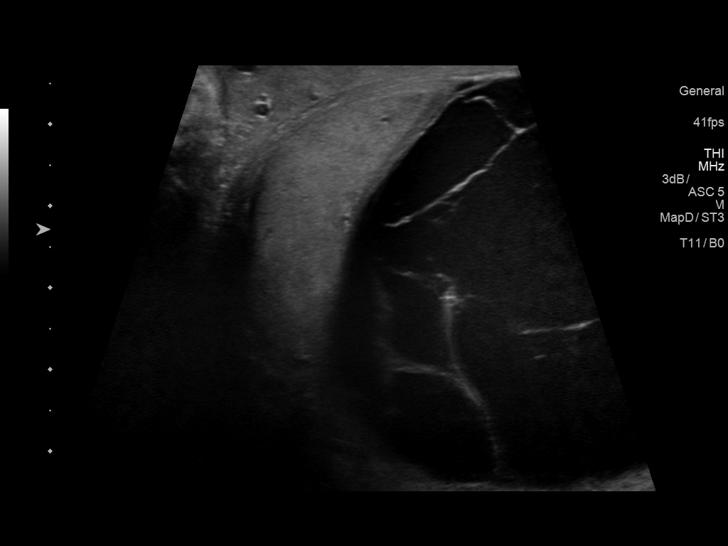
[im 52/89]
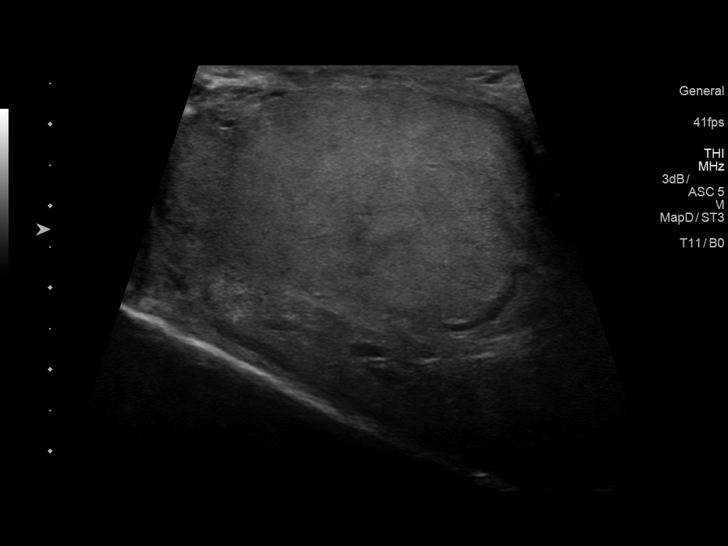
[im 59/89]
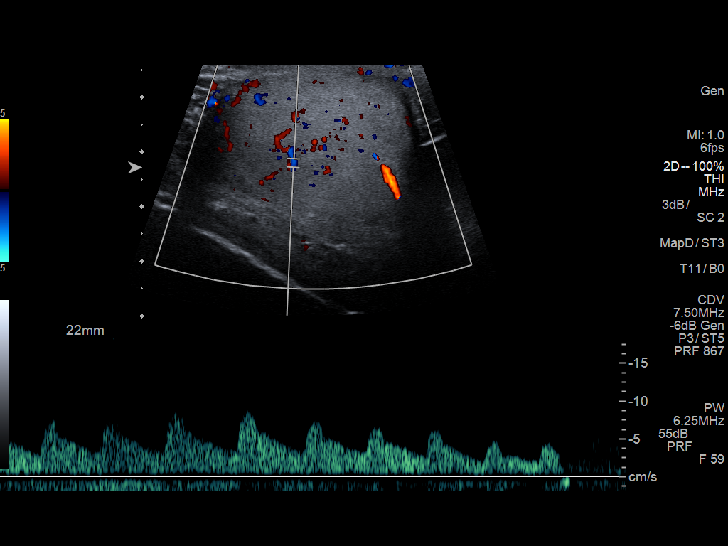
[im 67/89]
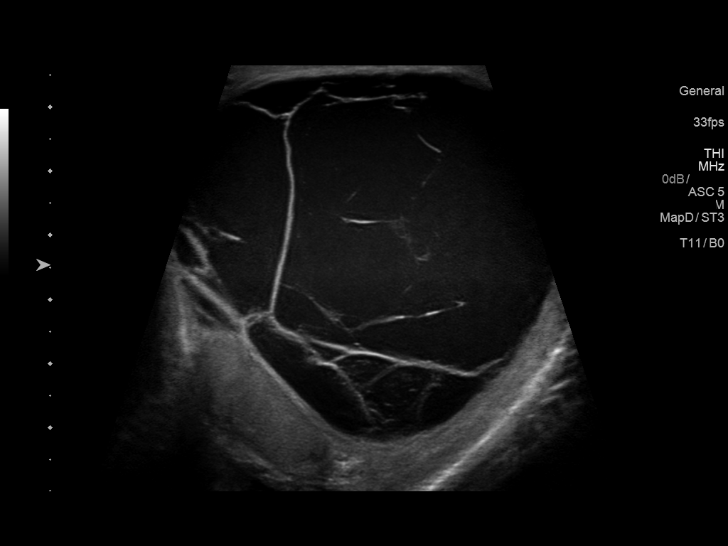
[im 74/89]
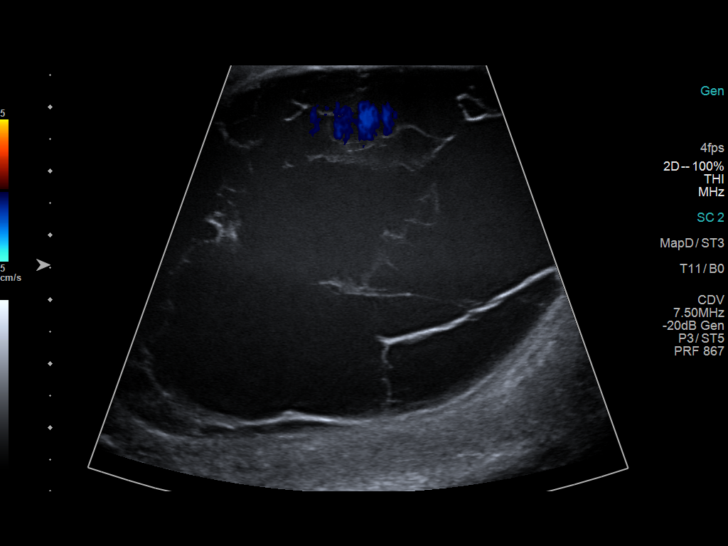
[im 81/89]
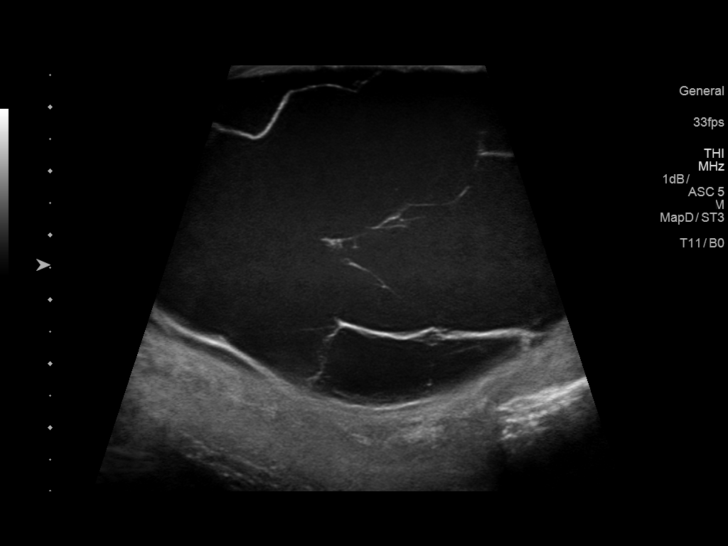
[im 89/89]
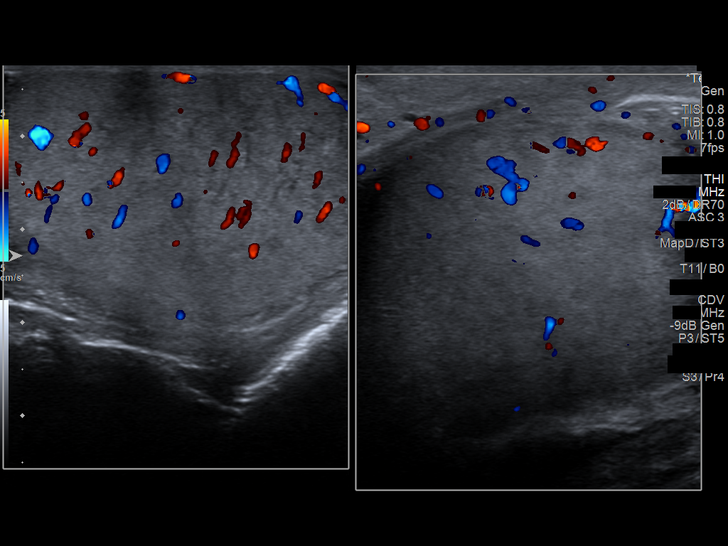

[13 of 25 positions shown; findings below may reference images not displayed]

FINDINGS: Right testicle

Measurements: 4.9 by 2.7 by 3.1 cm. No mass or microlithiasis
visualized. Mildly striated appearance of the testicles for example
on image 8 of the first series.

Left testicle

Measurements: 4.6 by 2.8 by 2.7 cm. No mass or microlithiasis
visualized. Subtle heterogeneity of echogenicity in the testicle but
without the striated appearance of the contralateral side.

Right epididymis:  Normal in size and appearance.

Left epididymis:  Normal in size and appearance.

Hydrocele: Septated complex paratesticular mass in the left scrotum
measures 8.8 by 4.9 by 6.2 cm (volume = 140 cm^3), with some
hyperechoic/complex elements in some of the septations. The lesion
appears to exert some mass effect on the testicle.

Small hydrocele adjacent to the right testicle, image 13.

Varicocele:  None visualized.

Pulsed Doppler interrogation of both testes demonstrates normal low
resistance arterial and venous waveforms bilaterally.
IMPRESSION: 1. Approximately 140 cubic cm septated complex paratesticular lesion
in the left scrotum, with a characteristic appearance for pyocele or
hematocele.
2. Mildly striated appearance of the right testicle parenchyma, a
finding which can be seen in a variety of inflammatory and
neoplastic conditions. There is also some faint heterogeneity of the
left testicle parenchyma, without asymmetry of blood flow. Given the
suspected potential pyocele, orchitis is certainly a differential
diagnostic consideration. Followup sonography of the scrotum after
treatment may be warranted to ensure resolution of the presumed
pyocele and to reassess the testicles.
3. There is also a small hydrocele adjacent to the right testicle.
4. Normal Doppler waveforms in both testicles.

## 2019-02-18 ENCOUNTER — Other Ambulatory Visit: Payer: Self-pay | Admitting: Family

## 2019-02-18 DIAGNOSIS — J4540 Moderate persistent asthma, uncomplicated: Secondary | ICD-10-CM

## 2019-02-18 NOTE — Telephone Encounter (Signed)
Pt aware refill sent to pharmacy 

## 2019-03-04 DIAGNOSIS — K047 Periapical abscess without sinus: Secondary | ICD-10-CM | POA: Diagnosis not present

## 2019-03-10 ENCOUNTER — Ambulatory Visit (INDEPENDENT_AMBULATORY_CARE_PROVIDER_SITE_OTHER): Payer: Medicare Other | Admitting: Family

## 2019-03-10 ENCOUNTER — Encounter: Payer: Self-pay | Admitting: Family

## 2019-03-10 ENCOUNTER — Inpatient Hospital Stay (HOSPITAL_COMMUNITY)
Admission: EM | Admit: 2019-03-10 | Discharge: 2019-03-16 | DRG: 988 | Disposition: A | Discharge status: Home / Self Care | Payer: Medicare Other | Attending: Internal Medicine | Admitting: Internal Medicine

## 2019-03-10 ENCOUNTER — Other Ambulatory Visit: Payer: Self-pay

## 2019-03-10 ENCOUNTER — Emergency Department (HOSPITAL_COMMUNITY): Payer: Medicare Other

## 2019-03-10 ENCOUNTER — Encounter (HOSPITAL_COMMUNITY): Payer: Self-pay

## 2019-03-10 VITALS — BP 134/87 | HR 125 | Temp 98.9°F | Ht 72.0 in | Wt 213.0 lb

## 2019-03-10 DIAGNOSIS — E663 Overweight: Secondary | ICD-10-CM | POA: Diagnosis not present

## 2019-03-10 DIAGNOSIS — Z79899 Other long term (current) drug therapy: Secondary | ICD-10-CM | POA: Diagnosis not present

## 2019-03-10 DIAGNOSIS — K047 Periapical abscess without sinus: Secondary | ICD-10-CM | POA: Diagnosis present

## 2019-03-10 DIAGNOSIS — Z833 Family history of diabetes mellitus: Secondary | ICD-10-CM | POA: Diagnosis not present

## 2019-03-10 DIAGNOSIS — Z20828 Contact with and (suspected) exposure to other viral communicable diseases: Secondary | ICD-10-CM | POA: Diagnosis present

## 2019-03-10 DIAGNOSIS — G8929 Other chronic pain: Secondary | ICD-10-CM | POA: Diagnosis present

## 2019-03-10 DIAGNOSIS — Z91030 Bee allergy status: Secondary | ICD-10-CM

## 2019-03-10 DIAGNOSIS — L0291 Cutaneous abscess, unspecified: Secondary | ICD-10-CM

## 2019-03-10 DIAGNOSIS — Z0001 Encounter for general adult medical examination with abnormal findings: Secondary | ICD-10-CM | POA: Diagnosis not present

## 2019-03-10 DIAGNOSIS — E559 Vitamin D deficiency, unspecified: Secondary | ICD-10-CM | POA: Diagnosis not present

## 2019-03-10 DIAGNOSIS — E1165 Type 2 diabetes mellitus with hyperglycemia: Secondary | ICD-10-CM | POA: Diagnosis not present

## 2019-03-10 DIAGNOSIS — B9562 Methicillin resistant Staphylococcus aureus infection as the cause of diseases classified elsewhere: Secondary | ICD-10-CM | POA: Diagnosis present

## 2019-03-10 DIAGNOSIS — R1310 Dysphagia, unspecified: Secondary | ICD-10-CM | POA: Diagnosis not present

## 2019-03-10 DIAGNOSIS — J45909 Unspecified asthma, uncomplicated: Secondary | ICD-10-CM | POA: Diagnosis present

## 2019-03-10 DIAGNOSIS — D473 Essential (hemorrhagic) thrombocythemia: Secondary | ICD-10-CM | POA: Diagnosis present

## 2019-03-10 DIAGNOSIS — Z0000 Encounter for general adult medical examination without abnormal findings: Secondary | ICD-10-CM

## 2019-03-10 DIAGNOSIS — Z03818 Encounter for observation for suspected exposure to other biological agents ruled out: Secondary | ICD-10-CM | POA: Diagnosis not present

## 2019-03-10 DIAGNOSIS — Z720 Tobacco use: Secondary | ICD-10-CM

## 2019-03-10 DIAGNOSIS — K113 Abscess of salivary gland: Principal | ICD-10-CM | POA: Diagnosis present

## 2019-03-10 DIAGNOSIS — E119 Type 2 diabetes mellitus without complications: Secondary | ICD-10-CM

## 2019-03-10 DIAGNOSIS — L0211 Cutaneous abscess of neck: Secondary | ICD-10-CM | POA: Diagnosis not present

## 2019-03-10 DIAGNOSIS — Z716 Tobacco abuse counseling: Secondary | ICD-10-CM

## 2019-03-10 DIAGNOSIS — J4541 Moderate persistent asthma with (acute) exacerbation: Secondary | ICD-10-CM

## 2019-03-10 DIAGNOSIS — Z7951 Long term (current) use of inhaled steroids: Secondary | ICD-10-CM

## 2019-03-10 DIAGNOSIS — F1290 Cannabis use, unspecified, uncomplicated: Secondary | ICD-10-CM | POA: Diagnosis present

## 2019-03-10 DIAGNOSIS — R69 Illness, unspecified: Secondary | ICD-10-CM | POA: Diagnosis not present

## 2019-03-10 DIAGNOSIS — R5381 Other malaise: Secondary | ICD-10-CM | POA: Diagnosis not present

## 2019-03-10 DIAGNOSIS — M272 Inflammatory conditions of jaws: Secondary | ICD-10-CM | POA: Diagnosis not present

## 2019-03-10 LAB — BASIC METABOLIC PANEL
Anion gap: 12 (ref 5–15)
BUN: 11 mg/dL (ref 6–20)
CO2: 25 mmol/L (ref 22–32)
Calcium: 8.9 mg/dL (ref 8.9–10.3)
Chloride: 96 mmol/L — ABNORMAL LOW (ref 98–111)
Creatinine, Ser: 1.07 mg/dL (ref 0.61–1.24)
GFR calc Af Amer: >60 mL/min (ref >60)
GFR calc non Af Amer: >60 mL/min (ref >60)
Glucose, Bld: 177 mg/dL — ABNORMAL HIGH (ref 70–99)
Potassium: 3.6 mmol/L (ref 3.5–5.1)
Sodium: 133 mmol/L — ABNORMAL LOW (ref 135–145)

## 2019-03-10 LAB — CBC WITH DIFFERENTIAL/PLATELET
Abs Immature Granulocytes: 0.05 10*3/uL (ref 0.00–0.07)
Basophils Absolute: 0 10*3/uL (ref 0.0–0.1)
Basophils Relative: 0 %
Eosinophils Absolute: 0.1 10*3/uL (ref 0.0–0.5)
Eosinophils Relative: 1 %
HCT: 39.5 % (ref 39.0–52.0)
Hemoglobin: 13.2 g/dL (ref 13.0–17.0)
Immature Granulocytes: 1 %
Lymphocytes Relative: 16 %
Lymphs Abs: 1.7 10*3/uL (ref 0.7–4.0)
MCH: 30.3 pg (ref 26.0–34.0)
MCHC: 33.4 g/dL (ref 30.0–36.0)
MCV: 90.6 fL (ref 80.0–100.0)
Monocytes Absolute: 0.7 10*3/uL (ref 0.1–1.0)
Monocytes Relative: 7 %
Neutro Abs: 8.2 10*3/uL — ABNORMAL HIGH (ref 1.7–7.7)
Neutrophils Relative %: 75 %
Platelets: 415 10*3/uL — ABNORMAL HIGH (ref 150–400)
RBC: 4.36 MIL/uL (ref 4.22–5.81)
RDW: 12.9 % (ref 11.5–15.5)
WBC: 10.7 10*3/uL — ABNORMAL HIGH (ref 4.0–10.5)
nRBC: 0 % (ref 0.0–0.2)

## 2019-03-10 LAB — POC SARS CORONAVIRUS 2 AG -  ED: SARS Coronavirus 2 Ag: NEGATIVE

## 2019-03-10 MED ORDER — CLINDAMYCIN PHOSPHATE 900 MG/50ML IV SOLN
900.0000 mg | Freq: Once | INTRAVENOUS | Status: AC
  Administered 2019-03-10: 22:00:00 900 mg via INTRAVENOUS
  Filled 2019-03-10: qty 50

## 2019-03-10 MED ORDER — MORPHINE SULFATE (PF) 2 MG/ML IV SOLN
2.0000 mg | Freq: Once | INTRAVENOUS | Status: AC
  Administered 2019-03-10: 22:00:00 2 mg via INTRAVENOUS
  Filled 2019-03-10: qty 1

## 2019-03-10 MED ORDER — MORPHINE SULFATE (PF) 2 MG/ML IV SOLN
2.0000 mg | INTRAVENOUS | Status: DC | PRN
  Administered 2019-03-11 (×2): 2 mg via INTRAVENOUS
  Filled 2019-03-10 (×2): qty 1

## 2019-03-10 MED ORDER — CLINDAMYCIN PHOSPHATE 600 MG/50ML IV SOLN
600.0000 mg | Freq: Three times a day (TID) | INTRAVENOUS | Status: DC
  Administered 2019-03-11 – 2019-03-16 (×17): 600 mg via INTRAVENOUS
  Filled 2019-03-10 (×17): qty 50

## 2019-03-10 MED ORDER — SODIUM CHLORIDE 0.9 % IV SOLN
2.0000 g | Freq: Once | INTRAVENOUS | Status: AC
  Administered 2019-03-10: 17:00:00 2 g via INTRAVENOUS
  Filled 2019-03-10: qty 20

## 2019-03-10 MED ORDER — ONDANSETRON HCL 4 MG/2ML IJ SOLN
4.0000 mg | Freq: Four times a day (QID) | INTRAMUSCULAR | Status: DC | PRN
  Administered 2019-03-12: 4 mg via INTRAVENOUS

## 2019-03-10 MED ORDER — SODIUM CHLORIDE 0.9 % IV SOLN: 2.0000 g | INTRAVENOUS | Status: DC

## 2019-03-10 MED ORDER — KCL IN DEXTROSE-NACL 20-5-0.9 MEQ/L-%-% IV SOLN
INTRAVENOUS | Status: DC
  Administered 2019-03-11 (×2): via INTRAVENOUS
  Filled 2019-03-10 (×5): qty 1000

## 2019-03-10 MED ORDER — IOHEXOL 300 MG/ML  SOLN
75.0000 mL | Freq: Once | INTRAMUSCULAR | Status: AC | PRN
  Administered 2019-03-10: 19:00:00 75 mL via INTRAVENOUS

## 2019-03-10 MED ORDER — ONDANSETRON HCL 4 MG PO TABS: 4.0000 mg | ORAL_TABLET | Freq: Four times a day (QID) | ORAL | Status: DC | PRN

## 2019-03-10 MED ORDER — MOMETASONE FURO-FORMOTEROL FUM 100-5 MCG/ACT IN AERO
2.0000 | INHALATION_SPRAY | Freq: Two times a day (BID) | RESPIRATORY_TRACT | Status: DC
  Administered 2019-03-11 – 2019-03-16 (×7): 2 via RESPIRATORY_TRACT
  Filled 2019-03-10 (×2): qty 8.8

## 2019-03-10 NOTE — ED Notes (Signed)
Pt arrived via [facility] from [facility], pt A & O, pain decreased to 4/10, speaking clear sentences, handling secretions.  Dr. Benjamine Mola paged

## 2019-03-10 NOTE — ED Notes (Signed)
EDP in room  

## 2019-03-10 NOTE — ED Notes (Signed)
Called pharmacy to bring Rocephin

## 2019-03-10 NOTE — ED Provider Notes (Addendum)
[facility] EMERGENCY DEPARTMENT Provider Note   CSN: [id number] Arrival date & time: 03/10/19  1345     History   Chief Complaint Chief Complaint  Patient presents with  . Abscess    HPI NAME Casey Daniels is a 43 y.o. male with PMH significant for asthma who presents to the ED with 7-day history of progressively worsening right-sided jaw swelling.  Patient reports that he was evaluated in urgent care on 03/04/2019 for right-sided mandibular discomfort and mild pain and was subsequently treated with ibuprofen and amoxicillin 3 times daily x7 days.  Patient reports that it has since progressively worsened to the point that he has significant difficulty opening his mouth and chewing.  He is still able to tolerate secretions and he denies any wheezing or stridor.  He also denies any fevers, chills, headache, dizziness, chest pain or shortness of breath, or other symptoms.  He denies any new medications and this has not happened before.     HPI  Past Medical History:  Diagnosis Date  . Arthritis    bilateral ankles due to club foot release as an infant  . Asthma     Patient Active Problem List   Diagnosis Date Noted  . Neck abscess 03/10/2019  . Current smoker 05/17/2017  . Overweight (BMI 25.0-29.9) 11/28/2016  . Excessive drinking of alcohol 11/28/2016  . Vitamin D deficiency 05/29/2016  . Asthma 05/26/2016    Past Surgical History:  Procedure Laterality Date  . CLUB FOOT RELEASE Bilateral         Home Medications    Prior to Admission medications   Medication Sig Start Date End Date Taking? Authorizing Provider  amoxicillin (AMOXIL) 500 MG capsule Take 500 mg by mouth every 8 (eight) hours. 7 day course starting on 03/04/2019 03/04/19  Yes [provider]  budesonide-formoterol (SYMBICORT) 80-4.5 MCG/ACT inhaler 2 PUFFS 2 TIMES A DAY Patient taking differently: Inhale 2 puffs into the lungs daily as needed (for shortness of breath).  02/18/19  Yes Hawks,  Christy A, FNP  ibuprofen (ADVIL) 800 MG tablet Take 800 mg by mouth every 8 (eight) hours as needed for mild pain or moderate pain.  03/04/19  Yes [provider]    Family History Family History  Problem Relation Age of Onset  . Diabetes Mother   . Healthy Brother     Social History Social History   Tobacco Use  . Smoking status: Former Smoker    Packs/day: 0.25    Years: 15.00    Pack years: 3.75    Types: Cigarettes    Quit date: 03/11/2018    Years since quitting: 0.9  . Smokeless tobacco: Never Used  . Tobacco comment: quits off and on  Substance Use Topics  . Alcohol use: No    Frequency: Never    Comment: No alcohol since 12/2016  . Drug use: Yes    Types: Marijuana     Allergies   Bee venom   Review of Systems Review of Systems  All other systems reviewed and are negative.    Physical Exam Updated Vital Signs BP (!) 159/98   Pulse 95   Temp 99.7 F (37.6 C) (Oral)   Resp 17   Ht 6\' 2"  (1.88 m)   Wt 96.6 kg   SpO2 95%   BMI 27.35 kg/m   Physical Exam Vitals and nursing note reviewed. Exam conducted with a chaperone present.  HENT:     Nose: Nose normal.  Mouth/Throat:     Comments: Oropharynx is clear, patent.  Tolerating secretions.  Trismus noted. Eyes:     General: No scleral icterus.    Extraocular Movements: Extraocular movements intact.     Conjunctiva/sclera: Conjunctivae normal.     Pupils: Pupils are equal, round, and reactive to light.  Neck:     Comments: Significant swelling and mild erythema on right side of neck. Cardiovascular:     Rate and Rhythm: Normal rate and regular rhythm.     Pulses: Normal pulses.     Heart sounds: Normal heart sounds.  Pulmonary:     Effort: Pulmonary effort is normal.     Breath sounds: Normal breath sounds.     Comments: No wheezing or stridor. Musculoskeletal:     Cervical back: No rigidity or tenderness.  Skin:    General: Skin is dry.  Neurological:     Mental Status: He  is alert and oriented to person, place, and time.     GCS: GCS eye subscore is 4. GCS verbal subscore is 5. GCS motor subscore is 6.  Psychiatric:        Mood and Affect: Mood normal.        Behavior: Behavior normal.        Thought Content: Thought content normal.      ED Treatments / Results  Labs (all labs ordered are listed, but only abnormal results are displayed) Labs Reviewed  CBC WITH DIFFERENTIAL/PLATELET - Abnormal; Notable for the following components:      Result Value   WBC 10.7 (*)    Platelets 415 (*)    Neutro Abs 8.2 (*)    All other components within normal limits  BASIC METABOLIC PANEL - Abnormal; Notable for the following components:   Sodium 133 (*)    Chloride 96 (*)    Glucose, Bld 177 (*)    All other components within normal limits  SARS CORONAVIRUS 2 (TAT 6-24 HRS)  POC SARS CORONAVIRUS 2 AG -  ED    EKG None  Radiology Ct Soft Tissue Neck W Contrast  Result Date: 03/10/2019 CLINICAL DATA:  Sore throat/stridor, epiglottitis or tonsillitis suspected. EXAM: CT NECK WITH CONTRAST TECHNIQUE: Multidetector CT imaging of the neck was performed using the standard protocol following the bolus administration of intravenous contrast. CONTRAST:  60mL OMNIPAQUE IOHEXOL 300 MG/ML  SOLN COMPARISON:  No pertinent prior studies available for comparison. FINDINGS: Pharynx and larynx: Poor dentition with multiple absent teeth. There is prominent periapical lucency surrounding the two posterior-most right upper maxillary molars (series 5, image 54). Right parapharyngeal submucosal edema. In combination with large right-sided presumed neck abscess, there is mild/moderate effacement of the lower nasopharyngeal airway and mild effacement of the oropharyngeal airway. No discrete mass or swelling within the larynx. Salivary glands: The presumed right neck abscess likely involves the right parotid gland. The right submandibular gland is poorly delineated, likely related to  adjacent inflammatory change. The left parotid and submandibular glands are normal. Thyroid: Negative Lymph nodes: Right level II/III and bilateral level I lymphadenopathy is likely reactive. Vascular: Displacement and near complete effacement of the upper right internal jugular vein. Medial displacement of the right common and internal carotid arteries, with the right ICA having a partially retropharyngeal course. Limited intracranial: No abnormality identified. Visualized orbits: Visualized orbits demonstrate no acute abnormality. Mastoids and visualized paranasal sinuses: Moderate mucosal thickening within the inferior right maxillary sinus, suspected odontogenic in origin. No significant mastoid effusion. Skeleton: No acute bony abnormality.  Cervical spondylosis without high-grade bony spinal canal narrowing. Upper chest: No consolidation within the imaged lung apices. Other: There is a very large multiloculated necrotic focus within the right neck centered posterior to the right mandible, extending along the angle of the mandible and inferiorly to the level of the hyoid bone. This measures 5.0 x 6.5 x 8 cm (AP x TV x CC) (series 2, image 44) (series 4, image 61). There is likely involvement of the right parotid gland. There is posterior displacement and possible involvement of the right sternocleidomastoid muscle. There is posteromedial displacement and near complete effacement of the upper right internal jugular vein. There is medial displacement of the common and internal carotid arteries. This presumably reflects a large multiloculated abscess as there is surrounding soft tissue stranding and skin thickening. These results were called by telephone at the time of interpretation on 03/10/2019 at 7:26 pm to provider Dr. Dewayne Hatch, who verbally acknowledged these results. IMPRESSION: 5.0 x 6.5 x 8 cm multiloculated presumed abscess within the upper right neck with surrounding inflammatory changes, as detailed.  There is extension into the right parapharyngeal space with associated submucosal edema. Resultant mild/moderate narrowing of the lower nasopharyngeal airway and mild narrowing of the oropharyngeal airway. This may have began as necrotizing adenitis. Close clinical follow-up with repeat imaging as warranted to exclude alternative etiologies (necrotic mass). Posteromedial displacement and near complete effacement of the upper right internal jugular vein. Medial displacement of the right common and internal carotid arteries. The right ICA has a partially retropharyngeal course. Right level II/III and bilateral level I lymphadenopathy, likely reactive. Prominent periapical lucency surrounding the two posterior-most right maxillary teeth. Suspected odontogenic sinusitis within the inferior right maxillary sinus. Electronically Signed   By: Kellie Simmering DO   On: 03/10/2019 19:29    Procedures Procedures (including critical care time)  Medications Ordered in ED Medications  morphine 2 MG/ML injection 2 mg (has no administration in time range)  clindamycin (CLEOCIN) IVPB 600 mg (has no administration in time range)  cefTRIAXone (ROCEPHIN) 2 g in sodium chloride 0.9 % 100 mL IVPB (has no administration in time range)  dextrose 5 % and 0.9 % NaCl with KCl 20 mEq/L infusion (has no administration in time range)  cefTRIAXone (ROCEPHIN) 2 g in sodium chloride 0.9 % 100 mL IVPB (0 g Intravenous Stopped 03/10/19 1815)  iohexol (OMNIPAQUE) 300 MG/ML solution 75 mL (75 mLs Intravenous Contrast Given 03/10/19 1837)  clindamycin (CLEOCIN) IVPB 900 mg (0 mg Intravenous Stopped 03/10/19 2202)  morphine 2 MG/ML injection 2 mg (2 mg Intravenous Given 03/10/19 2214)     Initial Impression / Assessment and Plan / ED Course  I have reviewed the triage vital signs and the nursing notes.  Pertinent labs & imaging results that were available during my care of the patient were reviewed by me and considered in my medical  decision making (see chart for details).        CT neck with contrast demonstrates a 5 x 8 multiloculated right-sided neck abscess that may have begun as necrotizing adenitis, but etiology unclear.  Mild narrowing of oropharyngeal and lower nasopharyngeal airways.  Medial displacement of right common and internal carotid arteries.  He was started on IV Rocephin shortly after arrival to the ED.  He denies any significant pain or fevers, but endorses trismus and diminished ability to eat.  No wheezing or stridor on physical exam.  We will consult with ENT physician Dr. Constance Holster to discuss plans for patient.  Patient will ultimately need to go to [facility] and will need to either be admitted by Dr. Constance Holster or by a hospitalist over there so that he may receive ENT intervention tomorrow.  Dr. Roderic Palau spoke with Dr. Benjamine Mola who states that he will see him over at [facility] but requests that we contact hospitalist to have him admitted there. He also recommends 900 mg Clindamycin IV. If they cannot admit, we will need to do ED-to-ED transfer.   10:54 PM Spoke with Dr. Denton Brick and patient will be admitted over at [facility] but will need to arrange for ED-to-ED transfer as their are bed limitations. Will arrange for transfer. Called over to [facility] to request accepting physician and Dr. Vanita Panda will accept patient. Will also put in consult to Dr. Glenford Peers from oral surgery that the patient will be going to [facility] to be evaluated by Dr. Benjamine Mola and that there is question as to whether or not his neck abscess is dental in origin. UPDATE: I still have not heard anything back from Dr. Glenford Peers after placing consult. Please consider oral surgery consult upon admission to [facility].   Final Clinical Impressions(s) / ED Diagnoses   Final diagnoses:  Abscess    ED Discharge Orders    None       Reita Chard 03/10/19 2254    Milton Ferguson, MD 03/14/19 0932    Corena Herter, PA-C 03/26/19 0800     Milton Ferguson, MD 03/26/19 [phone]

## 2019-03-10 NOTE — ED Notes (Signed)
Paged Dr Benjamine Mola to RN Autumn

## 2019-03-10 NOTE — ED Notes (Signed)
Dr. Teoh at bedside  

## 2019-03-10 NOTE — ED Triage Notes (Signed)
Pt brought to ED via [facility] for abscess of right upper tooth. Pt with notable abscess on right side of neck

## 2019-03-10 NOTE — Consult Note (Signed)
Reason for Consult: Odontogenic and neck abscess Referring Physician: Krista Blue, PA-C  HPI:  Casey Daniels is a 43 y.o. male who presents earlier this evening to the [facility] emergency room complaining of pain and swelling over the right face and neck.  His pain started a week ago around his broken right maxillary tooth.  It has progressively worsened.  He denies difficulty breathing, but has pain with chewing and has difficulty opening his mouth. Patient saw his primary care provider and was prescribed amoxicillin for 7 days.  He was not entirely compliant with the prescription.  But with worsening swelling he was referred to the ED by his primary care provider.  His CT scan showed a large right upper neck abscess.  Past Medical History:  Diagnosis Date  . Arthritis    bilateral ankles due to club foot release as an infant  . Asthma     Past Surgical History:  Procedure Laterality Date  . CLUB FOOT RELEASE Bilateral     Family History  Problem Relation Age of Onset  . Diabetes Mother   . Healthy Brother     Social History:  reports that he quit smoking about a year ago. His smoking use included cigarettes. He has a 3.75 pack-year smoking history. He has never used smokeless tobacco. He reports current drug use. Drug: Marijuana. He reports that he does not drink alcohol.  Allergies:  Allergies  Allergen Reactions  . Bee Venom Swelling    Prior to Admission medications   Medication Sig Start Date End Date Taking? Authorizing Provider  amoxicillin (AMOXIL) 500 MG capsule Take 500 mg by mouth every 8 (eight) hours. 7 day course starting on 03/04/2019 03/04/19  Yes [provider]  budesonide-formoterol (SYMBICORT) 80-4.5 MCG/ACT inhaler 2 PUFFS 2 TIMES A DAY Patient taking differently: Inhale 2 puffs into the lungs daily as needed (for shortness of breath).  02/18/19  Yes Hawks, Christy A, FNP  ibuprofen (ADVIL) 800 MG tablet Take 800 mg by mouth every 8 (eight)  hours as needed for mild pain or moderate pain.  03/04/19  Yes [provider]    Medications:  I have reviewed the patient's current medications. Scheduled: . mometasone-formoterol  2 puff Inhalation BID   Continuous: . cefTRIAXone (ROCEPHIN)  IV    . clindamycin (CLEOCIN) IV    . dextrose 5 % and 0.9 % NaCl with KCl 20 mEq/L      Results for orders placed or performed during the hospital encounter of 03/10/19 (from the past 48 hour(s))  CBC with Differential     Status: Abnormal   Collection Time: 03/10/19  4:33 PM  Result Value Ref Range   WBC 10.7 (H) 4.0 - 10.5 K/uL   RBC 4.36 4.22 - 5.81 MIL/uL   Hemoglobin 13.2 13.0 - 17.0 g/dL   HCT 39.5 39.0 - 52.0 %   MCV 90.6 80.0 - 100.0 fL   MCH 30.3 26.0 - 34.0 pg   MCHC 33.4 30.0 - 36.0 g/dL   RDW 12.9 11.5 - 15.5 %   Platelets 415 (H) 150 - 400 K/uL   nRBC 0.0 0.0 - 0.2 %   Neutrophils Relative % 75 %   Neutro Abs 8.2 (H) 1.7 - 7.7 K/uL   Lymphocytes Relative 16 %   Lymphs Abs 1.7 0.7 - 4.0 K/uL   Monocytes Relative 7 %   Monocytes Absolute 0.7 0.1 - 1.0 K/uL   Eosinophils Relative 1 %   Eosinophils Absolute 0.1  0.0 - 0.5 K/uL   Basophils Relative 0 %   Basophils Absolute 0.0 0.0 - 0.1 K/uL   Immature Granulocytes 1 %   Abs Immature Granulocytes 0.05 0.00 - 0.07 K/uL    Comment: Performed at [facility], [address], [place], [place] [address]  Basic metabolic panel     Status: Abnormal   Collection Time: 03/10/19  4:33 PM  Result Value Ref Range   Sodium 133 (L) 135 - 145 mmol/L   Potassium 3.6 3.5 - 5.1 mmol/L   Chloride 96 (L) 98 - 111 mmol/L   CO2 25 22 - 32 mmol/L   Glucose, Bld 177 (H) 70 - 99 mg/dL   BUN 11 6 - 20 mg/dL   Creatinine, Ser 1.07 0.61 - 1.24 mg/dL   Calcium 8.9 8.9 - 10.3 mg/dL   GFR calc non Af Amer >60 >60 mL/min   GFR calc Af Amer >60 >60 mL/min   Anion gap 12 5 - 15    Comment: Performed at [facility], [address], [place], [place] [address]  POC SARS Coronavirus 2 Ag-ED -  Nasal Swab (BD Veritor Kit)     Status: None   Collection Time: 03/10/19  6:13 PM  Result Value Ref Range   SARS Coronavirus 2 Ag NEGATIVE NEGATIVE    Comment: (NOTE) SARS-CoV-2 antigen NOT DETECTED.  Negative results are presumptive.  Negative results do not preclude SARS-CoV-2 infection and should not be used as the sole basis for treatment or other patient management decisions, including infection  control decisions, particularly in the presence of clinical signs and  symptoms consistent with COVID-19, or in those who have been in contact with the virus.  Negative results must be combined with clinical observations, patient history, and epidemiological information. The expected result is Negative. Fact Sheet for Patients: PodPark.tn Fact Sheet for Healthcare Providers: GiftContent.is This test is not yet approved or cleared by the [place] FDA and  has been authorized for detection and/or diagnosis of SARS-CoV-2 by FDA under an Emergency Use Authorization (EUA).  This EUA will remain in effect (meaning this test can be used) for the duration of  the COVID-19 de claration under Section 564(b)(1) of the Act, 21 U.S.C. section 360bbb-3(b)(1), unless the authorization is terminated or revoked sooner.     Ct Soft Tissue Neck W Contrast  Result Date: 03/10/2019 CLINICAL DATA:  Sore throat/stridor, epiglottitis or tonsillitis suspected. EXAM: CT NECK WITH CONTRAST TECHNIQUE: Multidetector CT imaging of the neck was performed using the standard protocol following the bolus administration of intravenous contrast. CONTRAST:  71m OMNIPAQUE IOHEXOL 300 MG/ML  SOLN COMPARISON:  No pertinent prior studies available for comparison. FINDINGS: Pharynx and larynx: Poor dentition with multiple absent teeth. There is prominent periapical lucency surrounding the two posterior-most right upper maxillary molars (series 5, image 54). Right  parapharyngeal submucosal edema. In combination with large right-sided presumed neck abscess, there is mild/moderate effacement of the lower nasopharyngeal airway and mild effacement of the oropharyngeal airway. No discrete mass or swelling within the larynx. Salivary glands: The presumed right neck abscess likely involves the right parotid gland. The right submandibular gland is poorly delineated, likely related to adjacent inflammatory change. The left parotid and submandibular glands are normal. Thyroid: Negative Lymph nodes: Right level II/III and bilateral level I lymphadenopathy is likely reactive. Vascular: Displacement and near complete effacement of the upper right internal jugular vein. Medial displacement of the right common and internal carotid arteries, with the right ICA having  a partially retropharyngeal course. Limited intracranial: No abnormality identified. Visualized orbits: Visualized orbits demonstrate no acute abnormality. Mastoids and visualized paranasal sinuses: Moderate mucosal thickening within the inferior right maxillary sinus, suspected odontogenic in origin. No significant mastoid effusion. Skeleton: No acute bony abnormality. Cervical spondylosis without high-grade bony spinal canal narrowing. Upper chest: No consolidation within the imaged lung apices. Other: There is a very large multiloculated necrotic focus within the right neck centered posterior to the right mandible, extending along the angle of the mandible and inferiorly to the level of the hyoid bone. This measures 5.0 x 6.5 x 8 cm (AP x TV x CC) (series 2, image 44) (series 4, image 61). There is likely involvement of the right parotid gland. There is posterior displacement and possible involvement of the right sternocleidomastoid muscle. There is posteromedial displacement and near complete effacement of the upper right internal jugular vein. There is medial displacement of the common and internal carotid arteries. This  presumably reflects a large multiloculated abscess as there is surrounding soft tissue stranding and skin thickening. These results were called by telephone at the time of interpretation on 03/10/2019 at 7:26 pm to provider Dr. Dewayne Hatch, who verbally acknowledged these results. IMPRESSION: 5.0 x 6.5 x 8 cm multiloculated presumed abscess within the upper right neck with surrounding inflammatory changes, as detailed. There is extension into the right parapharyngeal space with associated submucosal edema. Resultant mild/moderate narrowing of the lower nasopharyngeal airway and mild narrowing of the oropharyngeal airway. This may have began as necrotizing adenitis. Close clinical follow-up with repeat imaging as warranted to exclude alternative etiologies (necrotic mass). Posteromedial displacement and near complete effacement of the upper right internal jugular vein. Medial displacement of the right common and internal carotid arteries. The right ICA has a partially retropharyngeal course. Right level II/III and bilateral level I lymphadenopathy, likely reactive. Prominent periapical lucency surrounding the two posterior-most right maxillary teeth. Suspected odontogenic sinusitis within the inferior right maxillary sinus. Electronically Signed   By: Kellie Simmering DO   On: 03/10/2019 19:29   Review of Systems   All other systems reviewed and are negative.  Blood pressure (!) 146/92, pulse 97, temperature 99.1 F (37.3 C), temperature source Oral, resp. rate 16, height 6' 2"  (1.88 m), weight 96.6 kg, SpO2 96 %. Physical exam: General: Alert and oriented x3. Head: NCAT. Eyes: PERRL, lids and conjunctivae normal Ears: Normal auricles and EACs. Mouth: Poor dentition.  One of his right maxillary premolar is loose and tender to touch. Neck: Large swelling of his right upper neck, no stridor or wheezing appreciated.   Respiratory: clear to auscultation bilaterally, no wheezing, no crackles. Normal respiratory  effort. No accessory muscle use.  Cardiovascular: Regular rate and rhythm.  Abdomen: no tenderness, no masses palpated. Musculoskeletal: no clubbing / cyanosis. No joint deformity upper and lower extremities. Good ROM, no contractures. Normal muscle tone.  Skin: no rashes, lesions, ulcers. No induration Neurologic: CN 2-12 grossly intact. Strength 5/5 in all 4.  Psychiatric: Normal judgment and insight. Alert and oriented x 3. Normal mood.  Assessment/Plan: Odontogenic right neck abscess. -Agree with admission and IV antibiotic treatment. -Will need oral surgery consult. -Will determine the need for surgical intervention pending his response to IV antibiotic.  Halaina Vanduzer W Estelene Carmack 03/10/2019, 11:48 PM

## 2019-03-10 NOTE — ED Notes (Signed)
Lab in room drawing blood work  

## 2019-03-10 NOTE — H&P (Signed)
History and Physical    Casey Daniels [id number] DOB: 1975/06/25 DOA: 03/10/2019  PCP: Sharion Balloon, FNP   Patient coming from: Home  I have personally briefly reviewed patient's old medical records in [place]  Chief Complaint: Neck pain and swelling.  HPI: Casey Daniels is a 43 y.o. male with medical history significant for  Asthma, presented with near teamer reports of pain and swelling to the right side of.  Patient reports chronic pain from one of his teeth one the right side and he feels this is why the swelling started.  Denies fever or chills at home.  He denies difficulty breathing, but has pain with chewing and has difficulty opening his mouth.  Patient saw his primary care provider and was prescribed amoxicillin ibuprofen for 7 days.  He was not entirely complaint with the prescription.  But with worsening swelling he was referred to the ED by his primary care provider.  ED Course: Temp 99.7, WBC 10.7.  Soft Tissue neck CT with contrast-5.0 x 6.5 x 8 cm multiloculated presumed abscess within the upper right neck with surrounding inflammatory changes.  Extension into the right parapharyngeal space with associated submucosal edema, mild narrowing of the lower nasopharyngeal airway and mild narrowing of the oropharyngeal area.  Patient was started on IV ceftriaxone and clindamycin in the ED. EDP talked to Dr. Benjamine Mola on call for ENT, patient to be seen at [facility], I recommended IV clindamycin.  As no Telemetry beds are available at [facility], will be transferred to [facility] ED for evaluation of further management, pending bed availability.  Review of Systems: As per HPI all other systems reviewed and negative.  Past Medical History:  Diagnosis Date  . Arthritis    bilateral ankles due to club foot release as an infant  . Asthma     Past Surgical History:  Procedure Laterality Date  . CLUB FOOT RELEASE Bilateral      reports that he quit smoking about a year  ago. His smoking use included cigarettes. He has a 3.75 pack-year smoking history. He has never used smokeless tobacco. He reports current drug use. Drug: Marijuana. He reports that he does not drink alcohol.  Allergies  Allergen Reactions  . Bee Venom Swelling    Family History  Problem Relation Age of Onset  . Diabetes Mother   . Healthy Brother     Prior to Admission medications   Medication Sig Start Date End Date Taking? Authorizing Provider  amoxicillin (AMOXIL) 500 MG capsule Take 500 mg by mouth every 8 (eight) hours. 7 day course starting on 03/04/2019 03/04/19  Yes [provider]  budesonide-formoterol (SYMBICORT) 80-4.5 MCG/ACT inhaler 2 PUFFS 2 TIMES A DAY Patient taking differently: Inhale 2 puffs into the lungs daily as needed (for shortness of breath).  02/18/19  Yes Hawks, Christy A, FNP  ibuprofen (ADVIL) 800 MG tablet Take 800 mg by mouth every 8 (eight) hours as needed for mild pain or moderate pain.  03/04/19  Yes [provider]    Physical Exam: Vitals:   03/10/19 1425 03/10/19 1426 03/10/19 1723 03/10/19 2156  BP: (!) 143/111  (!) 156/93   Pulse: (!) 110  96 99  Resp: 20  17   Temp: 99.7 F (37.6 C)     TempSrc: Oral     SpO2: 97%  96% 96%  Weight:  96.6 kg    Height:  6\' 2"  (1.88 m)      Constitutional:  NAD, calm, comfortable Vitals:   03/10/19 1425 03/10/19 1426 03/10/19 1723 03/10/19 2156  BP: (!) 143/111  (!) 156/93   Pulse: (!) 110  96 99  Resp: 20  17   Temp: 99.7 F (37.6 C)     TempSrc: Oral     SpO2: 97%  96% 96%  Weight:  96.6 kg    Height:  6\' 2"  (1.88 m)     Eyes: PERRL, lids and conjunctivae normal ENMT: Mucous membranes are moist. Posterior pharynx clear of any exudate or lesions.  Neck: Large swelling to right uper jaw, lower jaw extending to neck, tender, no stridor no wheezing appreciated.   Respiratory: clear to auscultation bilaterally, no wheezing, no crackles. Normal respiratory effort. No accessory muscle  use.  Cardiovascular: Regular rate and rhythm, no murmurs / rubs / gallops. No extremity edema. 2+ pedal pulses.   Abdomen: no tenderness, no masses palpated. No hepatosplenomegaly. Bowel sounds positive.  Musculoskeletal: no clubbing / cyanosis. No joint deformity upper and lower extremities. Good ROM, no contractures. Normal muscle tone.  Skin: no rashes, lesions, ulcers. No induration Neurologic: CN 2-12 grossly intact. Strength 5/5 in all 4.  Psychiatric: Normal judgment and insight. Alert and oriented x 3. Normal mood.   Labs on Admission: I have personally reviewed following labs and imaging studies  CBC: Recent Labs  Lab 03/10/19 1633  WBC 10.7*  NEUTROABS 8.2*  HGB 13.2  HCT 39.5  MCV 90.6  PLT [id number]*   Basic Metabolic Panel: Recent Labs  Lab 03/10/19 1633  NA 133*  K 3.6  CL 96*  CO2 25  GLUCOSE 177*  BUN 11  CREATININE 1.07  CALCIUM 8.9    Radiological Exams on Admission: Ct Soft Tissue Neck W Contrast  Result Date: 03/10/2019 CLINICAL DATA:  Sore throat/stridor, epiglottitis or tonsillitis suspected. EXAM: CT NECK WITH CONTRAST TECHNIQUE: Multidetector CT imaging of the neck was performed using the standard protocol following the bolus administration of intravenous contrast. CONTRAST:  85mL OMNIPAQUE IOHEXOL 300 MG/ML  SOLN COMPARISON:  No pertinent prior studies available for comparison. FINDINGS: Pharynx and larynx: Poor dentition with multiple absent teeth. There is prominent periapical lucency surrounding the two posterior-most right upper maxillary molars (series 5, image 54). Right parapharyngeal submucosal edema. In combination with large right-sided presumed neck abscess, there is mild/moderate effacement of the lower nasopharyngeal airway and mild effacement of the oropharyngeal airway. No discrete mass or swelling within the larynx. Salivary glands: The presumed right neck abscess likely involves the right parotid gland. The right submandibular gland is poorly  delineated, likely related to adjacent inflammatory change. The left parotid and submandibular glands are normal. Thyroid: Negative Lymph nodes: Right level II/III and bilateral level I lymphadenopathy is likely reactive. Vascular: Displacement and near complete effacement of the upper right internal jugular vein. Medial displacement of the right common and internal carotid arteries, with the right ICA having a partially retropharyngeal course. Limited intracranial: No abnormality identified. Visualized orbits: Visualized orbits demonstrate no acute abnormality. Mastoids and visualized paranasal sinuses: Moderate mucosal thickening within the inferior right maxillary sinus, suspected odontogenic in origin. No significant mastoid effusion. Skeleton: No acute bony abnormality. Cervical spondylosis without high-grade bony spinal canal narrowing. Upper chest: No consolidation within the imaged lung apices. Other: There is a very large multiloculated necrotic focus within the right neck centered posterior to the right mandible, extending along the angle of the mandible and inferiorly to the level of the hyoid bone. This measures 5.0 x 6.5 x  8 cm (AP x TV x CC) (series 2, image 44) (series 4, image 61). There is likely involvement of the right parotid gland. There is posterior displacement and possible involvement of the right sternocleidomastoid muscle. There is posteromedial displacement and near complete effacement of the upper right internal jugular vein. There is medial displacement of the common and internal carotid arteries. This presumably reflects a large multiloculated abscess as there is surrounding soft tissue stranding and skin thickening. These results were called by telephone at the time of interpretation on 03/10/2019 at 7:26 pm to provider Dr. Dewayne Hatch, who verbally acknowledged these results. IMPRESSION: 5.0 x 6.5 x 8 cm multiloculated presumed abscess within the upper right neck with surrounding  inflammatory changes, as detailed. There is extension into the right parapharyngeal space with associated submucosal edema. Resultant mild/moderate narrowing of the lower nasopharyngeal airway and mild narrowing of the oropharyngeal airway. This may have began as necrotizing adenitis. Close clinical follow-up with repeat imaging as warranted to exclude alternative etiologies (necrotic mass). Posteromedial displacement and near complete effacement of the upper right internal jugular vein. Medial displacement of the right common and internal carotid arteries. The right ICA has a partially retropharyngeal course. Right level II/III and bilateral level I lymphadenopathy, likely reactive. Prominent periapical lucency surrounding the two posterior-most right maxillary teeth. Suspected odontogenic sinusitis within the inferior right maxillary sinus. Electronically Signed   By: Kellie Simmering DO   On: 03/10/2019 19:29    EKG: None  Assessment/Plan Active Problems:   Neck abscess  Neck abscess-resolved for sepsis on admission.  Question if dental etiology.  WBC 10.7.  Failed outpatient amoxicillin.  Soft tissue neck CT - 5.0 x 6.5 x 8 cm multiloculated presumed abscess within the upper right neck.  Mild/moderate narrowing of lower nasopharyngeal and oropharyngeal airwa. Repeat follow-up imaging warranted to exclude alternative etiologies-necrotic mass. - EDP talked to ENT, Dr. Benjamine Mola to see patient on arrival at [facility] - Continue IV clindamycin, as recommended by ENT - Will continue ceftriaxone for gram-negative coverage - IV morphine 2 mg as needed -N.p.o. - IVF D5 N/s + 20 KCL 100cc/hr   History of asthma-stable. -Resume home bronchodilators -As needed albuterol   DVT prophylaxis: SCDs Code Status: Full Code Family Communication: None at bedside Disposition Plan: . > 2 days Consults called: ENT Admission status: Inpatient, telemetry   Bethena Roys MD [facility]  03/10/2019, 10:18 PM

## 2019-03-10 NOTE — ED Notes (Signed)
Pt to CT

## 2019-03-10 NOTE — Patient Instructions (Signed)
Dental Abscess  A dental abscess is a collection of pus in or around a tooth that results from an infection. An abscess can cause pain in the affected area as well as other symptoms. Treatment is important to help with symptoms and to prevent the infection from spreading. What are the causes? This condition is caused by a bacterial infection around the root of the tooth that involves the inner part of the tooth (pulp). It may result from:  Severe tooth decay.  Trauma to the tooth, such as a broken or chipped tooth, that allows bacteria to enter into the pulp.  Severe gum disease around a tooth. What increases the risk? This condition is more likely to develop in males. It is also more likely to develop in people who:  Have dental decay (cavities).  Eat sugary snacks between meals.  Use tobacco products.  Have diabetes.  Have a weakened disease-fighting system (immune system).  Do not brush and care for their teeth regularly. What are the signs or symptoms? Symptoms of this condition include:  Severe pain in and around the infected tooth.  Swelling and redness around the infected tooth, in the mouth, or in the face.  Tenderness.  Pus drainage.  Bad breath.  Bitter taste in the mouth.  Difficulty swallowing.  Difficulty opening the mouth.  Nausea.  Vomiting.  Chills.  Swollen neck glands.  Fever. How is this diagnosed? This condition is diagnosed based on:  Your symptoms and your medical and dental history.  An examination of the infected tooth. During the exam, your dentist may tap on the infected tooth. You may also have X-rays of the affected area. How is this treated? This condition is treated by getting rid of the infection. This may be done with:  Incision and drainage. This procedure is done by making an incision in the abscess to drain out the pus. Removing pus is the first priority in treating an abscess.  Antibiotic medicines. These may be used  in certain situations.  Antibacterial mouth rinse.  A root canal. This may be performed to save the tooth. Your dentist accesses the visible part of your tooth (crown) with a drill and removes any damaged pulp. Then the space is filled and sealed off.  Tooth extraction. The tooth is pulled out if it cannot be saved by other treatment. You may also receive treatment for pain, such as:  Acetaminophen or NSAIDs.  Gels that contain a numbing medicine.  An injection to block the pain near your nerve. Follow these instructions at home: Medicines  Take over-the-counter and prescription medicines only as told by your dentist.  If you were prescribed an antibiotic, take it as told by your dentist. Do not stop taking the antibiotic even if you start to feel better.  If you were prescribed a gel that contains a numbing medicine, use it exactly as told in the directions. Do not use these gels for children who are younger than 1 years of age.  Do not drive or use heavy machinery while taking prescription pain medicine. General instructions  Rinse out your mouth often with salt water to relieve pain or swelling. To make a salt-water mixture, completely dissolve -1 tsp of salt in 1 cup of warm water.  Eat a soft diet while your abscess is healing.  Drink enough fluid to keep your urine pale yellow.  Do not apply heat to the outside of your mouth.  Do not use any products that contain nicotine or  tobacco, such as cigarettes and e-cigarettes. If you need help quitting, ask your health care provider.  Keep all follow-up visits as told by your dentist. This is important. How is this prevented?  Brush your teeth every morning and night with fluoride toothpaste. Floss one time each day.  Get regularly scheduled dental cleanings.  Consider having a dental sealant applied on teeth that have deep holes (caries).  Drink fluoridated water regularly. This includes most tap water. Check the label  on bottled water to see if it contains fluoride.  Drink water instead of sugary drinks.  Eat healthy meals and snacks.  Wear a mouth guard or face shield to protect your teeth while playing sports. Contact a health care provider if:  Your pain is worse and is not helped by medicine. Get help right away if:  You have a fever or chills.  Your symptoms suddenly get worse.  You have a very bad headache.  You have problems breathing or swallowing.  You have trouble opening your mouth.  You have swelling in your neck or around your eye. Summary  A dental abscess is a collection of pus in or around a tooth that results from an infection.  A dental abscess may result from severe tooth decay, trauma to the tooth, or severe gum disease around a tooth.  Symptoms include severe pain, swelling, redness, and drainage of pus in and around the infected tooth.  The first priority in treating a dental abscess is to drain out the pus. Treatment may also involve removing damage inside the tooth (root canal) or pulling out (extracting) the tooth. This information is not intended to replace advice given to you by your health care provider. Make sure you discuss any questions you have with your health care provider. Document Released: 03/20/2005 Document Revised: 03/02/2017 Document Reviewed: 11/20/2016 Elsevier Patient Education  2020 [employer].

## 2019-03-10 NOTE — ED Notes (Signed)
[facility] arrived  

## 2019-03-10 NOTE — Progress Notes (Signed)
Subjective:    Patient ID: Casey Daniels, male    DOB: October 20, 1975, 43 y.o.   MRN: [id number]  Chief Complaint  Patient presents with  . Annual Exam    has swelling in neck from infection around tooth   Pt presents to the office today for CPE. He states he had an abscess tooth. He reports he started having right upper tooth pain about two weeks ago then noticed right jaw and neck swelling about one week. He denies any SOB or swallowing difficulties. He states he went to Urgent Care 03/04/19 and was given Amoxicillin 500 mg TID with no relief. He does not have car to drive to doctor visits.   He reports he quit smoking and alcohol over the last three days.  Asthma He complains of hoarse voice. There is no cough. This is a chronic problem. The current episode started more than 1 year ago. The problem occurs intermittently. His past medical history is significant for asthma.      Review of Systems  HENT: Positive for hoarse voice.   Respiratory: Negative for cough.   All other systems reviewed and are negative.  Family History  Problem Relation Age of Onset  . Diabetes Mother   . Healthy Brother    Social History   Socioeconomic History  . Marital status: Single    Spouse name: Not on file  . Number of children: 0  . Years of education: Not on file  . Highest education level: Not on file  Occupational History  . Occupation: unemployed    Comment: disabled  Social Needs  . Financial resource strain: Not very hard  . Food insecurity    Worry: Never true    Inability: Never true  . Transportation needs    Medical: No    Non-medical: No  Tobacco Use  . Smoking status: Former Smoker    Packs/day: 0.25    Years: 15.00    Pack years: 3.75    Types: Cigarettes    Quit date: 03/11/2018    Years since quitting: 0.9  . Smokeless tobacco: Never Used  . Tobacco comment: quits off and on  Substance and Sexual Activity  . Alcohol use: No    Frequency: Never    Comment: No  alcohol since 12/2016  . Drug use: Yes    Types: Marijuana  . Sexual activity: Not on file  Lifestyle  . Physical activity    Days per week: 7 days    Minutes per session: 60 min  . Stress: Only a little  Relationships  . Social Herbalist on phone: Not on file    Gets together: Not on file    Attends religious service: Not on file    Active member of club or organization: Not on file    Attends meetings of clubs or organizations: Not on file    Relationship status: Not on file  Other Topics Concern  . Not on file  Social History Narrative  . Not on file       Objective:   Physical Exam Vitals signs reviewed.  Constitutional:      General: He is not in acute distress.    Appearance: He is well-developed.  HENT:     Head: Normocephalic.      Comments: Large softball size swelling that is hard and tender    Right Ear: Tympanic membrane normal.     Left Ear: Tympanic membrane normal.  Mouth/Throat:     Dentition: Abnormal dentition. Dental tenderness, gingival swelling, dental caries and dental abscesses present.  Eyes:     General:        Right eye: No discharge.        Left eye: No discharge.     Pupils: Pupils are equal, round, and reactive to light.  Neck:     Musculoskeletal: Normal range of motion and neck supple.     Thyroid: No thyromegaly.  Cardiovascular:     Rate and Rhythm: Normal rate and regular rhythm.     Heart sounds: Normal heart sounds. No murmur.  Pulmonary:     Effort: Pulmonary effort is normal. No respiratory distress.     Breath sounds: Normal breath sounds. No wheezing.  Abdominal:     General: Bowel sounds are normal. There is no distension.     Palpations: Abdomen is soft.     Tenderness: There is no abdominal tenderness.  Musculoskeletal: Normal range of motion.        General: No tenderness.  Skin:    General: Skin is warm and dry.     Findings: No erythema or rash.  Neurological:     Mental Status: He is alert and  oriented to person, place, and time.     Cranial Nerves: No cranial nerve deficit.     Deep Tendon Reflexes: Reflexes are normal and symmetric.  Psychiatric:        Behavior: Behavior normal.        Thought Content: Thought content normal.        Judgment: Judgment normal.       BP 134/87   Pulse (!) 125   Temp 98.9 F (37.2 C) (Temporal)   Ht 6' (1.829 m)   Wt 213 lb (96.6 kg)   SpO2 96%   BMI 28.89 kg/m      Assessment & Plan:  NEFI MUSICH comes in today with chief complaint of Annual Exam (has swelling in neck from infection around tooth)   Diagnosis and orders addressed:  1. Annual physical exam - CMP14+EGFR - CBC with Differential/Platelet - Lipid panel - Vitamin D 25 hydroxy - PSA, total and free  2. Abscessed tooth Given the size of abscess, worrisome that it will obstruct airway, he has failed outpt treatment - CMP14+EGFR - CBC with Differential/Platelet  3. Moderate persistent asthma with acute exacerbation - CMP14+EGFR - CBC with Differential/Platelet  4. Overweight (BMI 25.0-29.9) - CMP14+EGFR - CBC with Differential/Platelet  5. Vitamin D deficiency - CMP14+EGFR - CBC with Differential/Platelet   Labs pending Health Maintenance reviewed Diet and exercise encouraged  Follow up plan: After discharge from hospital    Evelina Dun, [place]

## 2019-03-11 DIAGNOSIS — L0211 Cutaneous abscess of neck: Secondary | ICD-10-CM

## 2019-03-11 DIAGNOSIS — K047 Periapical abscess without sinus: Secondary | ICD-10-CM

## 2019-03-11 LAB — CBC
HCT: 37.6 % — ABNORMAL LOW (ref 39.0–52.0)
Hemoglobin: 12.7 g/dL — ABNORMAL LOW (ref 13.0–17.0)
MCH: 30.4 pg (ref 26.0–34.0)
MCHC: 33.8 g/dL (ref 30.0–36.0)
MCV: 90 fL (ref 80.0–100.0)
Platelets: 428 10*3/uL — ABNORMAL HIGH (ref 150–400)
RBC: 4.18 MIL/uL — ABNORMAL LOW (ref 4.22–5.81)
RDW: 12.9 % (ref 11.5–15.5)
WBC: 11.9 10*3/uL — ABNORMAL HIGH (ref 4.0–10.5)
nRBC: 0 % (ref 0.0–0.2)

## 2019-03-11 LAB — CBC WITH DIFFERENTIAL/PLATELET
Basophils Absolute: 0 10*3/uL (ref 0.0–0.2)
Basos: 0 %
EOS (ABSOLUTE): 0.2 10*3/uL (ref 0.0–0.4)
Eos: 2 %
Hematocrit: 40.8 % (ref 37.5–51.0)
Hemoglobin: 14 g/dL (ref 13.0–17.7)
Immature Grans (Abs): 0 10*3/uL (ref 0.0–0.1)
Immature Granulocytes: 0 %
Lymphocytes Absolute: 2 10*3/uL (ref 0.7–3.1)
Lymphs: 15 %
MCH: 30.2 pg (ref 26.6–33.0)
MCHC: 34.3 g/dL (ref 31.5–35.7)
MCV: 88 fL (ref 79–97)
Monocytes Absolute: 0.9 10*3/uL (ref 0.1–0.9)
Monocytes: 7 %
Neutrophils Absolute: 10 10*3/uL — ABNORMAL HIGH (ref 1.4–7.0)
Neutrophils: 76 %
Platelets: 467 10*3/uL — ABNORMAL HIGH (ref 150–450)
RBC: 4.64 x10E6/uL (ref 4.14–5.80)
RDW: 12 % (ref 11.6–15.4)
WBC: 13.3 10*3/uL — ABNORMAL HIGH (ref 3.4–10.8)

## 2019-03-11 LAB — CMP14+EGFR
ALT: 40 IU/L (ref 0–44)
AST: 30 IU/L (ref 0–40)
Albumin/Globulin Ratio: 0.8 — ABNORMAL LOW (ref 1.2–2.2)
Albumin: 3.6 g/dL — ABNORMAL LOW (ref 4.0–5.0)
Alkaline Phosphatase: 118 IU/L — ABNORMAL HIGH (ref 39–117)
BUN/Creatinine Ratio: 8 — ABNORMAL LOW (ref 9–20)
BUN: 10 mg/dL (ref 6–24)
Bilirubin Total: 0.4 mg/dL (ref 0.0–1.2)
CO2: 21 mmol/L (ref 20–29)
Calcium: 9.3 mg/dL (ref 8.7–10.2)
Chloride: 92 mmol/L — ABNORMAL LOW (ref 96–106)
Creatinine, Ser: 1.21 mg/dL (ref 0.76–1.27)
GFR calc Af Amer: 84 mL/min/{1.73_m2} (ref >59)
GFR calc non Af Amer: 72 mL/min/{1.73_m2} (ref >59)
Globulin, Total: 4.5 g/dL (ref 1.5–4.5)
Glucose: 239 mg/dL — ABNORMAL HIGH (ref 65–99)
Potassium: 4 mmol/L (ref 3.5–5.2)
Sodium: 131 mmol/L — ABNORMAL LOW (ref 134–144)
Total Protein: 8.1 g/dL (ref 6.0–8.5)

## 2019-03-11 LAB — HIV ANTIBODY (ROUTINE TESTING W REFLEX): HIV Screen 4th Generation wRfx: NONREACTIVE

## 2019-03-11 LAB — BASIC METABOLIC PANEL
Anion gap: 10 (ref 5–15)
BUN: 12 mg/dL (ref 6–20)
CO2: 24 mmol/L (ref 22–32)
Calcium: 8.9 mg/dL (ref 8.9–10.3)
Chloride: 98 mmol/L (ref 98–111)
Creatinine, Ser: 1.09 mg/dL (ref 0.61–1.24)
GFR calc Af Amer: >60 mL/min (ref >60)
GFR calc non Af Amer: >60 mL/min (ref >60)
Glucose, Bld: 224 mg/dL — ABNORMAL HIGH (ref 70–99)
Potassium: 3.9 mmol/L (ref 3.5–5.1)
Sodium: 132 mmol/L — ABNORMAL LOW (ref 135–145)

## 2019-03-11 LAB — PSA, TOTAL AND FREE
PSA, Free Pct: 10 %
PSA, Free: 0.06 ng/mL
Prostate Specific Ag, Serum: 0.6 ng/mL (ref 0.0–4.0)

## 2019-03-11 LAB — VITAMIN D 25 HYDROXY (VIT D DEFICIENCY, FRACTURES): Vit D, 25-Hydroxy: 6.4 ng/mL — ABNORMAL LOW (ref 30.0–100.0)

## 2019-03-11 LAB — LIPID PANEL
Chol/HDL Ratio: 3.7 ratio (ref 0.0–5.0)
Cholesterol, Total: 119 mg/dL (ref 100–199)
HDL: 32 mg/dL — ABNORMAL LOW (ref >39)
LDL Chol Calc (NIH): 62 mg/dL (ref 0–99)
Triglycerides: 143 mg/dL (ref 0–149)
VLDL Cholesterol Cal: 25 mg/dL (ref 5–40)

## 2019-03-11 LAB — SARS CORONAVIRUS 2 (TAT 6-24 HRS): SARS Coronavirus 2: NEGATIVE

## 2019-03-11 MED ORDER — MORPHINE SULFATE (PF) 2 MG/ML IV SOLN
2.0000 mg | INTRAVENOUS | Status: DC | PRN
  Administered 2019-03-12 – 2019-03-15 (×6): 2 mg via INTRAVENOUS
  Filled 2019-03-11 (×6): qty 1

## 2019-03-11 MED ORDER — OXYCODONE HCL 5 MG PO TABS
5.0000 mg | ORAL_TABLET | ORAL | Status: DC | PRN
  Administered 2019-03-11 – 2019-03-16 (×5): 5 mg via ORAL
  Filled 2019-03-11 (×5): qty 1

## 2019-03-11 NOTE — ED Notes (Signed)
Coke and Ice given to the patient.

## 2019-03-11 NOTE — Progress Notes (Signed)
Hospitalist daily note   ESLI LANMAN [id number] DOB: 08-31-1975 DOA: 03/10/2019  PCP: Sharion Balloon, FNP   Narrative:  41 black male seen at PCP office 12/8 7-day H/oh right-sided jaw pain-seen 12/1 trialed ibuprofen amoxicillin-trismus noted, difficulty swallowing came to ED abscess right upper tooth abscess right side neck ENT consulted, oral surgeon consulted--Dr. Glenford Peers to see later this am  Data Reviewed:  White count down from 13-11.9 Hemoglobin down to 12.7 from 14 Sodium 132 Imaging 5.06 6.58 cm multiloculated abscess upper right neck-right parapharyngeal submucosal edema associated?  Necrotizing adenitis-displacement complete effacement upper right internal jugular vein prominent periapical lucency posterior most right maxillary teeth Assessment & Plan Odontogenic versus parotid abscess Discussed with Dr. Glenford Peers of oral surgery who will see the patient but feels that the neck mass may be more coming from the mandibular tooth source or parotid abscess-I have given him the number for Dr. Rowe Robert so that he may be able to discuss his concerns once he sees the patient For now continue clindamycin, keep n.p.o. in case procedures required continue D5 with K and pain control with morphine-repeat labs a.m.-without cardiac history does not need telemetry  Subjective: Awake coherent can verbalize tells me he was eating some chicken wings a couple weeks ago and chipped his upper tooth and has been having pain since No nausea no vomiting Able to phonate well swelling seems to be improved from prior Consultants:  Dr Benjamine Mola ENT Dr. Glenford Peers oral surgeon Procedures:   none Antimicrobials:   clindamycin    Objective: Vitals:   03/11/19 0300 03/11/19 0415 03/11/19 0500 03/11/19 0700  BP: (!) 133/96  (!) 132/93 124/83  Pulse:  88 91 83  Resp: 20 19    Temp: 99.6 F (37.6 C)     TempSrc: Oral     SpO2:  96% 97% 95%  Weight:      Height:       No intake or output data in the  24 hours ending 03/11/19 0803 Filed Weights   03/10/19 1426  Weight: 96.6 kg    Examination: Coherent well-built no distress EOMI impressive right neck swelling, premolar on right side does seem decay with a break in it I cannot appreciate lower teeth Chest is clear no rales mild wheeze S1-S2 no murmur Abdomen soft ROM intact neurologically grossly intact  Scheduled Meds: . mometasone-formoterol  2 puff Inhalation BID   Continuous Infusions: . cefTRIAXone (ROCEPHIN)  IV    . clindamycin (CLEOCIN) IV Stopped (03/11/19 0530)  . dextrose 5 % and 0.9 % NaCl with KCl 20 mEq/L 100 mL/hr at 03/11/19 0228     LOS: 1 day   Time spent: 35 minutes with care coordination time with multiple consultants Verneita Griffes, MD [facility]

## 2019-03-11 NOTE — ED Notes (Signed)
Ademola Spadaro [id number] brother looking for an update on a patient

## 2019-03-11 NOTE — Progress Notes (Signed)
Subjective: Still c/o right neck pain and pressure.  Objective: Vital signs in last 24 hours: Temp:  [98.9 F (37.2 C)-99.7 F (37.6 C)] 99.6 F (37.6 C) (12/08 0300) Pulse Rate:  [83-125] 83 (12/08 0700) Resp:  [8-20] 19 (12/08 0415) BP: (124-159)/(83-111) 124/83 (12/08 0700) SpO2:  [95 %-98 %] 95 % (12/08 0700) Weight:  [96.6 kg] 96.6 kg (12/07 1426)  Physical exam: General: Alert and oriented x3. Head: NCAT. Eyes:PERRL, lids and conjunctivae normal Ears: Normal auricles and EACs. Mouth: Poor dentition.  One of his right maxillary molar is loose and tender to touch. Neck:Large swelling of his right upper neck,no stridor or wheezing appreciated. No obvious purulent drainage drom the Stenson's duct. Respiratory:clear to auscultation bilaterally, no wheezing, no crackles. Normal respiratory effort. No accessory muscle use.  Cardiovascular:Regular rate and rhythm. Abdomen:no tenderness, no masses palpated. Musculoskeletal:no clubbing / cyanosis. No joint deformity upper and lower extremities. Good ROM, no contractures. Normal muscle tone.  Skin:no rashes, lesions, ulcers. No induration Neurologic:CN 2-12 grossly intact. Strength 5/5 in all 4.  Psychiatric:Normal judgment and insight. Alert and oriented x 3. Normal mood.   Recent Labs    03/10/19 1633 03/11/19 0452  WBC 10.7* 11.9*  HGB 13.2 12.7*  HCT 39.5 37.6*  PLT 415* 428*   Recent Labs    03/10/19 1633 03/11/19 0452  NA 133* 132*  K 3.6 3.9  CL 96* 98  CO2 25 24  GLUCOSE 177* 224*  BUN 11 12  CREATININE 1.07 1.09  CALCIUM 8.9 8.9    Medications:  I have reviewed the patient's current medications. Scheduled: . mometasone-formoterol  2 puff Inhalation BID   Continuous: . clindamycin (CLEOCIN) IV Stopped (03/11/19 0530)  . dextrose 5 % and 0.9 % NaCl with KCl 20 mEq/L 100 mL/hr at 03/11/19 0228    Assessment/Plan: Right neck abscess, parotid vs odontogenic etiology. - Continue IV abx. -  Plan to perform I&D of right neck/parotid abscess tomorrow at 8:30am. - May have soft diet today. - NPO after midnight.   LOS: 1 day   Keyna Blizard W Alesandra Smart 03/11/2019, 10:02 AM

## 2019-03-11 NOTE — ED Notes (Signed)
No Diet Ordered for Lunch, Pt. NPO.

## 2019-03-11 NOTE — ED Notes (Signed)
ED TO INPATIENT HANDOFF REPORT  ED Nurse Name and Phone #: [phone]  S Name/Age/Gender Casey Daniels 43 y.o. male Room/Bed: 054C/054C  Code Status   Code Status: Full Code  Home/SNF/Other Home Patient oriented to: self Is this baseline? Yes   Triage Complete: Triage complete  Chief Complaint Neck abscess [L02.11]  Triage Note Pt brought to ED via [facility] for abscess of right upper tooth. Pt with notable abscess on right side of neck   Allergies Allergies  Allergen Reactions  . Bee Venom Swelling    Level of Care/Admitting Diagnosis ED Disposition    ED Disposition Condition [facility] Area: [place] [[id number]] Level of Care: Telemetry Medical [104] Covid Evaluation: Confirmed COVID Negative Date Laboratory Confirmed COVID Negative: 03/10/2019 Diagnosis: Neck abscess [[id number]] Admitting Physician: Susy Manor [[id number]] Attending Physician: Bethena Roys [phone] Estimated length of stay: past midnight tomorrow Certification:: I certify this patient will need inpatient services for at least 2 midnights PT Class (Do Not Modify): Inp atient [101] PT Acc Code (Do Not Modify): Private [1] Dr. Vanita Panda accepting pt to ed       B Medical/Surgery History Past Medical History:  Diagnosis Date  . Arthritis    bilateral ankles due to club foot release as an infant  . Asthma    Past Surgical History:  Procedure Laterality Date  . CLUB FOOT RELEASE Bilateral      A IV Location/Drains/Wounds Patient Lines/Drains/Airways Status   Active Line/Drains/Airways    Name:   Placement date:   Placement time:   Site:   Days:   Peripheral IV 03/10/19 Right Antecubital   03/10/19    1439    Antecubital   1          Intake/Output Last 24 hours  Intake/Output Summary (Last 24 hours) at 03/11/2019 1405 Last data filed at 03/11/2019 1404 Gross per 24 hour  Intake -  Output 400 ml  Net -400 ml     Labs/Imaging Results for orders placed or performed during the hospital encounter of 03/10/19 (from the past 48 hour(s))  CBC with Differential     Status: Abnormal   Collection Time: 03/10/19  4:33 PM  Result Value Ref Range   WBC 10.7 (H) 4.0 - 10.5 K/uL   RBC 4.36 4.22 - 5.81 MIL/uL   Hemoglobin 13.2 13.0 - 17.0 g/dL   HCT 39.5 39.0 - 52.0 %   MCV 90.6 80.0 - 100.0 fL   MCH 30.3 26.0 - 34.0 pg   MCHC 33.4 30.0 - 36.0 g/dL   RDW 12.9 11.5 - 15.5 %   Platelets 415 (H) 150 - 400 K/uL   nRBC 0.0 0.0 - 0.2 %   Neutrophils Relative % 75 %   Neutro Abs 8.2 (H) 1.7 - 7.7 K/uL   Lymphocytes Relative 16 %   Lymphs Abs 1.7 0.7 - 4.0 K/uL   Monocytes Relative 7 %   Monocytes Absolute 0.7 0.1 - 1.0 K/uL   Eosinophils Relative 1 %   Eosinophils Absolute 0.1 0.0 - 0.5 K/uL   Basophils Relative 0 %   Basophils Absolute 0.0 0.0 - 0.1 K/uL   Immature Granulocytes 1 %   Abs Immature Granulocytes 0.05 0.00 - 0.07 K/uL    Comment: Performed at [facility], [address], [place], [place] [address]  Basic metabolic panel     Status: Abnormal   Collection Time: 03/10/19  4:33 PM  Result  Value Ref Range   Sodium 133 (L) 135 - 145 mmol/L   Potassium 3.6 3.5 - 5.1 mmol/L   Chloride 96 (L) 98 - 111 mmol/L   CO2 25 22 - 32 mmol/L   Glucose, Bld 177 (H) 70 - 99 mg/dL   BUN 11 6 - 20 mg/dL   Creatinine, Ser 1.07 0.61 - 1.24 mg/dL   Calcium 8.9 8.9 - 10.3 mg/dL   GFR calc non Af Amer >60 >60 mL/min   GFR calc Af Amer >60 >60 mL/min   Anion gap 12 5 - 15    Comment: Performed at [facility], [address], [place], [place] [address]  POC SARS Coronavirus 2 Ag-ED - Nasal Swab (BD Veritor Kit)     Status: None   Collection Time: 03/10/19  6:13 PM  Result Value Ref Range   SARS Coronavirus 2 Ag NEGATIVE NEGATIVE    Comment: (NOTE) SARS-CoV-2 antigen NOT DETECTED.  Negative results are presumptive.  Negative results do not preclude SARS-CoV-2 infection and should not be used as the sole  basis for treatment or other patient management decisions, including infection  control decisions, particularly in the presence of clinical signs and  symptoms consistent with COVID-19, or in those who have been in contact with the virus.  Negative results must be combined with clinical observations, patient history, and epidemiological information. The expected result is Negative. Fact Sheet for Patients: PodPark.tn Fact Sheet for Healthcare Providers: GiftContent.is This test is not yet approved or cleared by the [place] FDA and  has been authorized for detection and/or diagnosis of SARS-CoV-2 by FDA under an Emergency Use Authorization (EUA).  This EUA will remain in effect (meaning this test can be used) for the duration of  the COVID-19 de claration under Section 564(b)(1) of the Act, 21 U.S.C. section 360bbb-3(b)(1), unless the authorization is terminated or revoked sooner.   SARS CORONAVIRUS 2 (TAT 6-24 HRS) Nasopharyngeal Nasopharyngeal Swab     Status: None   Collection Time: 03/10/19  9:02 PM   Specimen: Nasopharyngeal Swab  Result Value Ref Range   SARS Coronavirus 2 NEGATIVE NEGATIVE    Comment: (NOTE) SARS-CoV-2 target nucleic acids are NOT DETECTED. The SARS-CoV-2 RNA is generally detectable in upper and lower respiratory specimens during the acute phase of infection. Negative results do not preclude SARS-CoV-2 infection, do not rule out co-infections with other pathogens, and should not be used as the sole basis for treatment or other patient management decisions. Negative results must be combined with clinical observations, patient history, and epidemiological information. The expected result is Negative. Fact Sheet for Patients: SugarRoll.be Fact Sheet for Healthcare Providers: https://www.woods-mathews.com/ This test is not yet approved or cleared by the  [place] FDA and  has been authorized for detection and/or diagnosis of SARS-CoV-2 by FDA under an Emergency Use Authorization (EUA). This EUA will remain  in effect (meaning this test can be used) for the duration of the COVID-19 declaration under Section 56 4(b)(1) of the Act, 21 U.S.C. section 360bbb-3(b)(1), unless the authorization is terminated or revoked sooner. Performed at [facility], [place] [address] [place], [place] [address]   HIV Antibody (routine testing w rflx)     Status: None   Collection Time: 03/11/19  2:28 AM  Result Value Ref Range   HIV Screen 4th Generation wRfx NON REACTIVE NON REACTIVE    Comment: Performed at [facility] [address] [place], [place] [address]  Basic metabolic panel  Status: Abnormal   Collection Time: 03/11/19  4:52 AM  Result Value Ref Range   Sodium 132 (L) 135 - 145 mmol/L   Potassium 3.9 3.5 - 5.1 mmol/L   Chloride 98 98 - 111 mmol/L   CO2 24 22 - 32 mmol/L   Glucose, Bld 224 (H) 70 - 99 mg/dL   BUN 12 6 - 20 mg/dL   Creatinine, Ser 1.09 0.61 - 1.24 mg/dL   Calcium 8.9 8.9 - 10.3 mg/dL   GFR calc non Af Amer >60 >60 mL/min   GFR calc Af Amer >60 >60 mL/min   Anion gap 10 5 - 15    Comment: Performed at [facility], [place] [address] [place], [place] [address]  CBC     Status: Abnormal   Collection Time: 03/11/19  4:52 AM  Result Value Ref Range   WBC 11.9 (H) 4.0 - 10.5 K/uL   RBC 4.18 (L) 4.22 - 5.81 MIL/uL   Hemoglobin 12.7 (L) 13.0 - 17.0 g/dL   HCT 37.6 (L) 39.0 - 52.0 %   MCV 90.0 80.0 - 100.0 fL   MCH 30.4 26.0 - 34.0 pg   MCHC 33.8 30.0 - 36.0 g/dL   RDW 12.9 11.5 - 15.5 %   Platelets 428 (H) 150 - 400 K/uL   nRBC 0.0 0.0 - 0.2 %    Comment: Performed at [facility], [place] [address], [place], [place] [address]   Ct Soft Tissue Neck W Contrast  Result Date: 03/10/2019 CLINICAL DATA:  Sore throat/stridor, epiglottitis or tonsillitis suspected. EXAM: CT NECK WITH CONTRAST  TECHNIQUE: Multidetector CT imaging of the neck was performed using the standard protocol following the bolus administration of intravenous contrast. CONTRAST:  66m OMNIPAQUE IOHEXOL 300 MG/ML  SOLN COMPARISON:  No pertinent prior studies available for comparison. FINDINGS: Pharynx and larynx: Poor dentition with multiple absent teeth. There is prominent periapical lucency surrounding the two posterior-most right upper maxillary molars (series 5, image 54). Right parapharyngeal submucosal edema. In combination with large right-sided presumed neck abscess, there is mild/moderate effacement of the lower nasopharyngeal airway and mild effacement of the oropharyngeal airway. No discrete mass or swelling within the larynx. Salivary glands: The presumed right neck abscess likely involves the right parotid gland. The right submandibular gland is poorly delineated, likely related to adjacent inflammatory change. The left parotid and submandibular glands are normal. Thyroid: Negative Lymph nodes: Right level II/III and bilateral level I lymphadenopathy is likely reactive. Vascular: Displacement and near complete effacement of the upper right internal jugular vein. Medial displacement of the right common and internal carotid arteries, with the right ICA having a partially retropharyngeal course. Limited intracranial: No abnormality identified. Visualized orbits: Visualized orbits demonstrate no acute abnormality. Mastoids and visualized paranasal sinuses: Moderate mucosal thickening within the inferior right maxillary sinus, suspected odontogenic in origin. No significant mastoid effusion. Skeleton: No acute bony abnormality. Cervical spondylosis without high-grade bony spinal canal narrowing. Upper chest: No consolidation within the imaged lung apices. Other: There is a very large multiloculated necrotic focus within the right neck centered posterior to the right mandible, extending along the angle of the mandible and  inferiorly to the level of the hyoid bone. This measures 5.0 x 6.5 x 8 cm (AP x TV x CC) (series 2, image 44) (series 4, image 61). There is likely involvement of the right parotid gland. There is posterior displacement and possible involvement of the right sternocleidomastoid muscle. There is posteromedial displacement and near  complete effacement of the upper right internal jugular vein. There is medial displacement of the common and internal carotid arteries. This presumably reflects a large multiloculated abscess as there is surrounding soft tissue stranding and skin thickening. These results were called by telephone at the time of interpretation on 03/10/2019 at 7:26 pm to provider Dr. Dewayne Hatch, who verbally acknowledged these results. IMPRESSION: 5.0 x 6.5 x 8 cm multiloculated presumed abscess within the upper right neck with surrounding inflammatory changes, as detailed. There is extension into the right parapharyngeal space with associated submucosal edema. Resultant mild/moderate narrowing of the lower nasopharyngeal airway and mild narrowing of the oropharyngeal airway. This may have began as necrotizing adenitis. Close clinical follow-up with repeat imaging as warranted to exclude alternative etiologies (necrotic mass). Posteromedial displacement and near complete effacement of the upper right internal jugular vein. Medial displacement of the right common and internal carotid arteries. The right ICA has a partially retropharyngeal course. Right level II/III and bilateral level I lymphadenopathy, likely reactive. Prominent periapical lucency surrounding the two posterior-most right maxillary teeth. Suspected odontogenic sinusitis within the inferior right maxillary sinus. Electronically Signed   By: Kellie Simmering DO   On: 03/10/2019 19:29    Pending Labs Unresulted Labs (From admission, onward)    Start     Ordered   03/12/19 0500  AM CBC  Tomorrow morning,   R    Question:  Specimen collection method   Answer:  IV Team=IV Team collect   03/11/19 0853   03/12/19 0500  am bmet  Tomorrow morning,   R    Question:  Specimen collection method  Answer:  IV Team=IV Team collect   03/11/19 0853          Vitals/Pain Today's Vitals   03/11/19 0500 03/11/19 0700 03/11/19 0815 03/11/19 1224  BP: (!) 132/93 124/83  128/79  Pulse: 91 83  85  Resp:    16  Temp:      TempSrc:      SpO2: 97% 95%  98%  Weight:      Height:      PainSc:   4  4     Isolation Precautions No active isolations  Medications Medications  mometasone-formoterol (DULERA) 100-5 MCG/ACT inhaler 2 puff (2 puffs Inhalation Given 03/11/19 1023)  ondansetron (ZOFRAN) tablet 4 mg (has no administration in time range)    Or  ondansetron (ZOFRAN) injection 4 mg (has no administration in time range)  morphine 2 MG/ML injection 2 mg (2 mg Intravenous Given 03/11/19 1357)  clindamycin (CLEOCIN) IVPB 600 mg (600 mg Intravenous New Bag/Given 03/11/19 1403)  dextrose 5 % and 0.9 % NaCl with KCl 20 mEq/L infusion ( Intravenous New Bag/Given 03/11/19 1355)  cefTRIAXone (ROCEPHIN) 2 g in sodium chloride 0.9 % 100 mL IVPB (0 g Intravenous Stopped 03/10/19 1815)  iohexol (OMNIPAQUE) 300 MG/ML solution 75 mL (75 mLs Intravenous Contrast Given 03/10/19 1837)  clindamycin (CLEOCIN) IVPB 900 mg (0 mg Intravenous Stopped 03/10/19 2202)  morphine 2 MG/ML injection 2 mg (2 mg Intravenous Given 03/10/19 2214)    Mobility walks Low fall risk   Focused Assessments surgical    R Recommendations: See Admitting Provider Note  Report given to:   Additional Notes:

## 2019-03-11 NOTE — ED Notes (Addendum)
Soft Diet was ordered for Lunch. Mash Potatoes w/ Gravy, Chicken Noddles Soup, Icee, and Jello.

## 2019-03-11 NOTE — ED Notes (Signed)
Per ENT pt may have soft diet and will go to OR at 0830 in am

## 2019-03-11 NOTE — ED Notes (Signed)
Oral surgeon into see pt and states it is pts parotid gland and he needs ENT , oral surgeon contacted ENT

## 2019-03-11 NOTE — ED Notes (Signed)
Admit dr into see pt , pt given urinal, per MD  He has spoken to oral surgeon and they will be in shortly, pt remains NPO for ? surgery

## 2019-03-12 ENCOUNTER — Encounter (HOSPITAL_COMMUNITY)
Admission: EM | Disposition: A | Discharge status: Home / Self Care | Payer: Self-pay | Source: Home / Self Care | Attending: Internal Medicine

## 2019-03-12 ENCOUNTER — Inpatient Hospital Stay (HOSPITAL_COMMUNITY): Payer: Medicare Other

## 2019-03-12 HISTORY — PX: INCISION AND DRAINAGE ABSCESS: SHX5864

## 2019-03-12 LAB — SURGICAL PCR SCREEN
MRSA, PCR: POSITIVE — AB
Staphylococcus aureus: POSITIVE — AB

## 2019-03-12 LAB — CBC WITH DIFFERENTIAL/PLATELET
Abs Immature Granulocytes: 0.03 10*3/uL (ref 0.00–0.07)
Basophils Absolute: 0 10*3/uL (ref 0.0–0.1)
Basophils Relative: 0 %
Eosinophils Absolute: 0.2 10*3/uL (ref 0.0–0.5)
Eosinophils Relative: 2 %
HCT: 37 % — ABNORMAL LOW (ref 39.0–52.0)
Hemoglobin: 12.2 g/dL — ABNORMAL LOW (ref 13.0–17.0)
Immature Granulocytes: 0 %
Lymphocytes Relative: 14 %
Lymphs Abs: 1.5 10*3/uL (ref 0.7–4.0)
MCH: 30.2 pg (ref 26.0–34.0)
MCHC: 33 g/dL (ref 30.0–36.0)
MCV: 91.6 fL (ref 80.0–100.0)
Monocytes Absolute: 0.9 10*3/uL (ref 0.1–1.0)
Monocytes Relative: 8 %
Neutro Abs: 8.1 10*3/uL — ABNORMAL HIGH (ref 1.7–7.7)
Neutrophils Relative %: 76 %
Platelets: 411 10*3/uL — ABNORMAL HIGH (ref 150–400)
RBC: 4.04 MIL/uL — ABNORMAL LOW (ref 4.22–5.81)
RDW: 13 % (ref 11.5–15.5)
WBC: 10.8 10*3/uL — ABNORMAL HIGH (ref 4.0–10.5)
nRBC: 0 % (ref 0.0–0.2)

## 2019-03-12 LAB — HEMOGLOBIN A1C
Hgb A1c MFr Bld: 11.4 % — ABNORMAL HIGH (ref 4.8–5.6)
Mean Plasma Glucose: 280.48 mg/dL

## 2019-03-12 LAB — RENAL FUNCTION PANEL
Albumin: 2.4 g/dL — ABNORMAL LOW (ref 3.5–5.0)
Anion gap: 11 (ref 5–15)
BUN: 11 mg/dL (ref 6–20)
CO2: 24 mmol/L (ref 22–32)
Calcium: 8.8 mg/dL — ABNORMAL LOW (ref 8.9–10.3)
Chloride: 100 mmol/L (ref 98–111)
Creatinine, Ser: 1.23 mg/dL (ref 0.61–1.24)
GFR calc Af Amer: >60 mL/min (ref >60)
GFR calc non Af Amer: >60 mL/min (ref >60)
Glucose, Bld: 240 mg/dL — ABNORMAL HIGH (ref 70–99)
Phosphorus: 2.8 mg/dL (ref 2.5–4.6)
Potassium: 4.1 mmol/L (ref 3.5–5.1)
Sodium: 135 mmol/L (ref 135–145)

## 2019-03-12 SURGERY — INCISION AND DRAINAGE, ABSCESS
Anesthesia: General | Site: Neck

## 2019-03-12 MED ORDER — LIDOCAINE 2% (20 MG/ML) 5 ML SYRINGE
INTRAMUSCULAR | Status: DC | PRN
  Administered 2019-03-12: 40 mg via INTRAVENOUS

## 2019-03-12 MED ORDER — KETOROLAC TROMETHAMINE 30 MG/ML IJ SOLN
INTRAMUSCULAR | Status: AC
  Filled 2019-03-12: qty 1

## 2019-03-12 MED ORDER — PROMETHAZINE HCL 25 MG/ML IJ SOLN: 6.2500 mg | INTRAMUSCULAR | Status: DC | PRN

## 2019-03-12 MED ORDER — HYDROMORPHONE HCL 1 MG/ML IJ SOLN: 0.2500 mg | INTRAMUSCULAR | Status: DC | PRN

## 2019-03-12 MED ORDER — ONDANSETRON HCL 4 MG/2ML IJ SOLN
INTRAMUSCULAR | Status: AC
  Filled 2019-03-12: qty 2

## 2019-03-12 MED ORDER — PROPOFOL 10 MG/ML IV BOLUS
INTRAVENOUS | Status: AC
  Filled 2019-03-12: qty 40

## 2019-03-12 MED ORDER — KETOROLAC TROMETHAMINE 30 MG/ML IJ SOLN
30.0000 mg | Freq: Once | INTRAMUSCULAR | Status: AC | PRN
  Administered 2019-03-12: 30 mg via INTRAVENOUS

## 2019-03-12 MED ORDER — SODIUM CHLORIDE 0.9 % IV SOLN
INTRAVENOUS | Status: DC | PRN
  Administered 2019-03-12: 500 mL

## 2019-03-12 MED ORDER — DEXAMETHASONE SODIUM PHOSPHATE 10 MG/ML IJ SOLN
INTRAMUSCULAR | Status: DC | PRN
  Administered 2019-03-12: 5 mg via INTRAVENOUS

## 2019-03-12 MED ORDER — MIDAZOLAM HCL 2 MG/2ML IJ SOLN
INTRAMUSCULAR | Status: AC
  Filled 2019-03-12: qty 2

## 2019-03-12 MED ORDER — LIDOCAINE HCL (CARDIAC) PF 100 MG/5ML IV SOSY: PREFILLED_SYRINGE | INTRAVENOUS | Status: DC | PRN

## 2019-03-12 MED ORDER — DEXAMETHASONE SODIUM PHOSPHATE 10 MG/ML IJ SOLN
INTRAMUSCULAR | Status: AC
  Filled 2019-03-12: qty 1

## 2019-03-12 MED ORDER — ACETAMINOPHEN 500 MG PO TABS: 1000.0000 mg | ORAL_TABLET | Freq: Once | ORAL | Status: DC

## 2019-03-12 MED ORDER — PROPOFOL 10 MG/ML IV BOLUS
INTRAVENOUS | Status: DC | PRN
  Administered 2019-03-12: 200 mg via INTRAVENOUS

## 2019-03-12 MED ORDER — FENTANYL CITRATE (PF) 250 MCG/5ML IJ SOLN
INTRAMUSCULAR | Status: DC | PRN
  Administered 2019-03-12 (×2): 50 ug via INTRAVENOUS
  Administered 2019-03-12 (×2): 25 ug via INTRAVENOUS
  Administered 2019-03-12: 50 ug via INTRAVENOUS
  Administered 2019-03-12 (×2): 25 ug via INTRAVENOUS

## 2019-03-12 MED ORDER — OXYCODONE HCL 5 MG PO TABS: 5.0000 mg | ORAL_TABLET | Freq: Once | ORAL | Status: DC | PRN

## 2019-03-12 MED ORDER — LACTATED RINGERS IV SOLN
INTRAVENOUS | Status: DC | PRN
  Administered 2019-03-12: 08:00:00 via INTRAVENOUS

## 2019-03-12 MED ORDER — 0.9 % SODIUM CHLORIDE (POUR BTL) OPTIME
TOPICAL | Status: DC | PRN
  Administered 2019-03-12: 1000 mL

## 2019-03-12 MED ORDER — LIDOCAINE 2% (20 MG/ML) 5 ML SYRINGE
INTRAMUSCULAR | Status: AC
  Filled 2019-03-12: qty 5

## 2019-03-12 MED ORDER — LIDOCAINE-EPINEPHRINE 1 %-1:100000 IJ SOLN
INTRAMUSCULAR | Status: AC
  Filled 2019-03-12: qty 1

## 2019-03-12 MED ORDER — FENTANYL CITRATE (PF) 250 MCG/5ML IJ SOLN
INTRAMUSCULAR | Status: AC
  Filled 2019-03-12: qty 5

## 2019-03-12 MED ORDER — SODIUM CHLORIDE 0.9 % IV SOLN
INTRAVENOUS | Status: AC
  Filled 2019-03-12: qty 500000

## 2019-03-12 MED ORDER — MEPERIDINE HCL 25 MG/ML IJ SOLN: 6.2500 mg | INTRAMUSCULAR | Status: DC | PRN

## 2019-03-12 MED ORDER — OXYCODONE HCL 5 MG/5ML PO SOLN: 5.0000 mg | Freq: Once | ORAL | Status: DC | PRN

## 2019-03-12 MED ORDER — MIDAZOLAM HCL 2 MG/2ML IJ SOLN
INTRAMUSCULAR | Status: DC | PRN
  Administered 2019-03-12: 2 mg via INTRAVENOUS

## 2019-03-12 SURGICAL SUPPLY — 34 items
BLADE SURG 15 STRL LF DISP TIS (BLADE) IMPLANT
BLADE SURG 15 STRL SS (BLADE)
BNDG GAUZE ELAST 4 BULKY (GAUZE/BANDAGES/DRESSINGS) ×3 IMPLANT
CANISTER SUCT 3000ML PPV (MISCELLANEOUS) ×3 IMPLANT
CLEANER TIP ELECTROSURG 2X2 (MISCELLANEOUS) ×3 IMPLANT
COVER SURGICAL LIGHT HANDLE (MISCELLANEOUS) ×3 IMPLANT
COVER WAND RF STERILE (DRAPES) ×3 IMPLANT
DERMABOND ADVANCED (GAUZE/BANDAGES/DRESSINGS)
DERMABOND ADVANCED .7 DNX12 (GAUZE/BANDAGES/DRESSINGS) IMPLANT
DRAPE HALF SHEET 40X57 (DRAPES) IMPLANT
ELECT COATED BLADE 2.86 ST (ELECTRODE) IMPLANT
ELECT NEEDLE BLADE 2-5/6 (NEEDLE) IMPLANT
ELECT REM PT RETURN 9FT ADLT (ELECTROSURGICAL) ×3
ELECTRODE REM PT RTRN 9FT ADLT (ELECTROSURGICAL) ×1 IMPLANT
GAUZE SPONGE 4X4 12PLY STRL LF (GAUZE/BANDAGES/DRESSINGS) ×3 IMPLANT
GAUZE XEROFORM 5X9 LF (GAUZE/BANDAGES/DRESSINGS) ×9 IMPLANT
GLOVE ECLIPSE 7.5 STRL STRAW (GLOVE) ×3 IMPLANT
GOWN STRL REUS W/ TWL LRG LVL3 (GOWN DISPOSABLE) ×2 IMPLANT
GOWN STRL REUS W/TWL LRG LVL3 (GOWN DISPOSABLE) ×4
KIT BASIN OR (CUSTOM PROCEDURE TRAY) ×3 IMPLANT
KIT TURNOVER KIT B (KITS) ×3 IMPLANT
NEEDLE HYPO 25GX1X1/2 BEV (NEEDLE) IMPLANT
NEEDLE SPNL 18GX3.5 QUINCKE PK (NEEDLE) ×3 IMPLANT
NS IRRIG 1000ML POUR BTL (IV SOLUTION) ×3 IMPLANT
PAD ARMBOARD 7.5X6 YLW CONV (MISCELLANEOUS) ×6 IMPLANT
PENCIL BUTTON HOLSTER BLD 10FT (ELECTRODE) ×3 IMPLANT
SPECIMEN JAR SMALL (MISCELLANEOUS) ×3 IMPLANT
SPONGE LAP 4X18 RFD (DISPOSABLE) ×3 IMPLANT
SUT SILK 2 0 (SUTURE)
SUT SILK 2 0 SH (SUTURE) ×3 IMPLANT
SUT SILK 2-0 18XBRD TIE 12 (SUTURE) IMPLANT
TOWEL GREEN STERILE FF (TOWEL DISPOSABLE) ×3 IMPLANT
TRAY ENT MC OR (CUSTOM PROCEDURE TRAY) ×3 IMPLANT
WATER STERILE IRR 1000ML POUR (IV SOLUTION) ×3 IMPLANT

## 2019-03-12 NOTE — Anesthesia Postprocedure Evaluation (Signed)
Anesthesia Post Note  Patient: Casey Daniels  Procedure(s) Performed: INCISION AND DRAINAGE NECK ABSCESS (N/A Neck)     Patient location during evaluation: PACU Anesthesia Type: General Level of consciousness: awake and alert, oriented and patient cooperative Pain management: pain level controlled Vital Signs Assessment: post-procedure vital signs reviewed and stable Respiratory status: spontaneous breathing, nonlabored ventilation and respiratory function stable Cardiovascular status: blood pressure returned to baseline and stable Postop Assessment: no apparent nausea or vomiting Anesthetic complications: no    Last Vitals:  Vitals:   03/12/19 0943 03/12/19 0952  BP: 125/83 127/76  Pulse: 89 88  Resp: 20 18  Temp:  36.7 C  SpO2: 95% 96%    Last Pain:  Vitals:   03/12/19 0952  TempSrc: Oral  PainSc:                  Pervis Hocking

## 2019-03-12 NOTE — Transfer of Care (Signed)
Immediate Anesthesia Transfer of Care Note  Patient: Casey Daniels  Procedure(s) Performed: INCISION AND DRAINAGE NECK ABSCESS (N/A Neck)  Patient Location: PACU  Anesthesia Type:General  Level of Consciousness: drowsy and patient cooperative  Airway & Oxygen Therapy: Patient Spontanous Breathing  Post-op Assessment: Report given to RN and Post -op Vital signs reviewed and stable  Post vital signs: Reviewed and stable  Last Vitals:  Vitals Value Taken Time  BP 131/82 03/12/19 0915  Temp    Pulse 101 03/12/19 0917  Resp 17 03/12/19 0917  SpO2 95 % 03/12/19 0917  Vitals shown include unvalidated device data.  Last Pain:  Vitals:   03/12/19 0451  TempSrc: Oral  PainSc:          Complications: No apparent anesthesia complications

## 2019-03-12 NOTE — Anesthesia Procedure Notes (Signed)
Procedure Name: LMA Insertion Date/Time: 03/12/2019 8:39 AM Performed by: Janace Litten, CRNA Pre-anesthesia Checklist: Patient identified, Emergency Drugs available, Suction available and Patient being monitored Patient Re-evaluated:Patient Re-evaluated prior to induction Oxygen Delivery Method: Circle System Utilized Preoxygenation: Pre-oxygenation with 100% oxygen Induction Type: IV induction LMA: LMA inserted LMA Size: 4.0 Number of attempts: 1 Placement Confirmation: positive ETCO2 Tube secured with: Tape Dental Injury: Teeth and Oropharynx as per pre-operative assessment

## 2019-03-12 NOTE — Anesthesia Preprocedure Evaluation (Addendum)
Anesthesia Evaluation  Patient identified by MRN, date of birth, ID band Patient awake    Reviewed: Allergy & Precautions, NPO status , Patient's Chart, lab work & pertinent test results  Airway Mallampati: II  TM Distance: >3 FB Neck ROM: Limited  Mouth opening: Limited Mouth Opening  Dental  (+) Poor Dentition, Dental Advisory Given,    Pulmonary asthma , former smoker,  Inhalers: symbicort  Quit smoking 03/2018, 3.75 pack year history   Pulmonary exam normal breath sounds clear to auscultation       Cardiovascular negative cardio ROS Normal cardiovascular exam Rhythm:Regular Rate:Normal     Neuro/Psych negative neurological ROS  negative psych ROS   GI/Hepatic negative GI ROS, (+)     substance abuse  alcohol use,   Endo/Other  negative endocrine ROS  Renal/GU negative Renal ROS  negative genitourinary   Musculoskeletal  (+) Arthritis , Osteoarthritis,  Hx B/L club foot   Abdominal Normal abdominal exam  (+)   Peds  Hematology negative hematology ROS (+)   Anesthesia Other Findings Lateral neck abscess  Reproductive/Obstetrics negative OB ROS                            Anesthesia Physical Anesthesia Plan  ASA: III  Anesthesia Plan: General   Post-op Pain Management:    Induction: Intravenous  PONV Risk Score and Plan: 2 and Ondansetron, Midazolam and Treatment may vary due to age or medical condition  Airway Management Planned: Oral ETT, Video Laryngoscope Planned, LMA and Fiberoptic Intubation Planned  Additional Equipment: None  Intra-op Plan:   Post-operative Plan: Extubation in OR  Informed Consent: I have reviewed the patients History and Physical, chart, labs and discussed the procedure including the risks, benefits and alternatives for the proposed anesthesia with the patient or authorized representative who has indicated his/her understanding and acceptance.      Dental advisory given  Plan Discussed with: CRNA  Anesthesia Plan Comments: (Very limited mouth opening, concern for difficult airway)       Anesthesia Quick Evaluation

## 2019-03-12 NOTE — Op Note (Signed)
DATE OF PROCEDURE: 03/12/2019  OPERATIVE REPORT   SURGEON: Leta Baptist, MD  PREOPERATIVE DIAGNOSIS: Right deep neck / parotid abscess.  POSTOPERATIVE DIAGNOSIS:Right deep neck / parotid abscess.  PROCEDURES PERFORMED: 1. Incision and drainage of complex right deep neck / parotid abscess.  ANESTHESIA: General laryngeal mask anesthesia.  COMPLICATIONS: None.  ESTIMATED BLOOD LOSS: 100 ml  INDICATION FOR PROCEDURE:  Casey Daniels is a 43 y.o. male who was admitted on 03/10/19 for treatment of his large right neck / parotid abscess. He was initially treated with oral antibiotic with worsening of his symptoms. His CT scan shows a very large multiloculated necrotic focus within the right neck centered posterior to the right mandible, extending along the angle of the mandible and inferiorly to the level of the hyoid bone. The mass measures 5.0 x 6.5 x 8 cm with involvement of the right parotid gland.  Based on the above findings, the decision was made for the patient to undergo the above-stated procedure. The risks, benefits, alternatives, and details of the procedures were discussed with the patient. Questions were invited and answered. Informed consent was obtained.  DESCRIPTION OF PROCEDURE: The patient was taken to the operating room and placed supine on the operating table. General laryngeal mask anesthesia was induced by the anesthesiologist.The patient was positioned and prepped and draped in the standard fashion for right neck surgery.   An 18G spinal needle was used to probe the right neck. Purulent fluid as aspirated. Aerobic and anaerobic cultures were sent. A transverse incision was made over the right neck/parotid abscess. The incision was carried down to the abscess cavity. A large amount of purulent fluid was drained. Extensive exploration was performed in the deep neck to open the multiloculated abscess cavities. The cavities were copiously irrigated with antibiotic  solution. The cavities were packed with xeroform packing.  The care of the patient was turned over to the anesthesiologist. The patient was awakened from anesthesia without difficulty. He was extubated and transferred to the recovery room in good condition.  OPERATIVE FINDINGS: Complex right deep neck / parotid abscess.  SPECIMEN: None.  FOLLOWUP CARE: The patient will be returned to his hospital bed for IV antibiotic.

## 2019-03-12 NOTE — Progress Notes (Signed)
PROGRESS NOTE    Casey Daniels  [id number] DOB: 27-Feb-1976 DOA: 03/10/2019 PCP: Sharion Balloon, FNP   Brief Narrative: 43 year old male in at PCP office 12/8 with 7-day history of right-sided jaw pain, seen 12 1 and tried ibuprofen amoxicillin, trismus is noted with difficulty swallowing so came to the ER found to have abscess ENT was consulted and was admitted.  Oral surgeon was also consult Dr. Glenford Peers  Subjective: Status post I and D this am. Ordering his meal this afternoon.  Complains of pain and has a dressing around the neck. Assessment & Plan:   Odontogenic abscess right deep neck abscess and parotid abscess:Status post I&D of complex right deep neck) abscess by ENT.  Appreciate input. Continue on iv clindamycin.Follow-up surgical wound culture.  Leukocytosis improved to 10.8K.  Hx of asthma: continue Inhalers.  Hyperglycemia blood sugar in 240.  I will check hemoglobin A1c. On d5w.  Thrombocytosis likely from #1.  MRSA + nasal screen  Body mass index is 27.35 kg/m.    DVT prophylaxis: SCD Code Status: Code Family Communication: plan of care discussed with patient at bedside. Disposition Plan: Remains inpatient for IV antibiotics for treatment of neck abscess  Consultants: ent  Procedures: I&D right neck abscess   CT neck " 5.0 x 6.5 x 8 cm multiloculated presumed abscess within the upper right neck with surrounding inflammatory changes, as detailed. There is extension into the right parapharyngeal space with associated submucosal edema. Resultant mild/moderate narrowing of the lower nasopharyngeal airway and mild narrowing of the oropharyngeal airway. This may have began as necrotizing adenitis. Close clinical follow-up with repeat imaging as warranted to exclude alternative etiologies (necrotic mass). Posteromedial displacement and near complete effacement of the upper right internal jugular vein. Medial displacement of the right common and internal carotid  arteries. The right ICA has a partially retropharyngeal course. Right level II/III and bilateral level I lymphadenopathy, likely reactive. Prominent periapical lucency surrounding the two posterior-most right maxillary teeth. Suspected odontogenic sinusitis within the inferior right maxillary sinu " Microbiology:  Antimicrobials: Anti-infectives (From admission, onward)   Start     Dose/Rate Route Frequency Ordered Stop   03/12/19 0856  polymyxin B 500,000 Units, bacitracin 50,000 Units in sodium chloride 0.9 % 500 mL irrigation  Status:  Discontinued       As needed 03/12/19 0856 03/12/19 0912   03/11/19 1800  cefTRIAXone (ROCEPHIN) 2 g in sodium chloride 0.9 % 100 mL IVPB  Status:  Discontinued     2 g 200 mL/hr over 30 Minutes Intravenous Every 24 hours 03/10/19 2221 03/11/19 0853   03/11/19 0500  clindamycin (CLEOCIN) IVPB 600 mg     600 mg 100 mL/hr over 30 Minutes Intravenous Every 8 hours 03/10/19 2221     03/10/19 2130  clindamycin (CLEOCIN) IVPB 900 mg     900 mg 100 mL/hr over 30 Minutes Intravenous  Once 03/10/19 2116 03/10/19 2202   03/10/19 1630  cefTRIAXone (ROCEPHIN) 2 g in sodium chloride 0.9 % 100 mL IVPB     2 g 200 mL/hr over 30 Minutes Intravenous  Once 03/10/19 1621 03/10/19 1815       Objective: Vitals:   03/12/19 0930 03/12/19 0943 03/12/19 0952 03/12/19 1214  BP: (!) 125/92 125/83 127/76 123/87  Pulse: 96 89 88 84  Resp: 13 20 18 16   Temp:   98 F (36.7 C) 97.7 F (36.5 C)  TempSrc:   Oral Oral  SpO2: 99% 95% 96% 97%  Weight:  Height:        Intake/Output Summary (Last 24 hours) at 03/12/2019 1226 Last data filed at 03/12/2019 0901 Gross per 24 hour  Intake 1785.64 ml  Output 550 ml  Net 1235.64 ml   Filed Weights   03/10/19 1426  Weight: 96.6 kg   Weight change:   Body mass index is 27.35 kg/m.  Intake/Output from previous day: 12/08 0701 - 12/09 0700 In: 1285.6 [I.V.:1203; IV Piggyback:82.6] Out: 400 [Urine:400] Intake/Output this  shift: Total I/O In: 500 [I.V.:500] Out: 150 [Blood:150]  Examination:  General exam: AAOx3,NAD, Weak appearing. HEENT:Oral mucosa moist, dressing present around the neck with mostly on the right neck  Respiratory system: Diminished at the base,no wheezing or crackles,no use of accessory muscle Cardiovascular system: S1 & S2 +, No JVD,. Gastrointestinal system: Abdomen soft, NT,ND, BS+ Nervous System:Alert, awake, moving extremities and grossly nonfocal Extremities: No edema, distal peripheral pulses palpable.  Skin: No rashes,no icterus. MSK: Normal muscle bulk,tone, power  Medications:  Scheduled Meds:  ketorolac       mometasone-formoterol  2 puff Inhalation BID   Continuous Infusions:  clindamycin (CLEOCIN) IV 600 mg (03/12/19 0509)   dextrose 5 % and 0.9 % NaCl with KCl 20 mEq/L 100 mL/hr at 03/11/19 1355    Data Reviewed: I have personally reviewed following labs and imaging studies  CBC: Recent Labs  Lab 03/10/19 1305 03/10/19 1633 03/11/19 0452 03/12/19 0614  WBC 13.3* 10.7* 11.9* 10.8*  NEUTROABS 10.0* 8.2*  --  8.1*  HGB 14.0 13.2 12.7* 12.2*  HCT 40.8 39.5 37.6* 37.0*  MCV 88 90.6 90.0 91.6  PLT 467* 415* 428* 123456*   Basic Metabolic Panel: Recent Labs  Lab 03/10/19 1305 03/10/19 1633 03/11/19 0452 03/12/19 0614  NA 131* 133* 132* 135  K 4.0 3.6 3.9 4.1  CL 92* 96* 98 100  CO2 21 25 24 24   GLUCOSE 239* 177* 224* 240*  BUN 10 11 12 11   CREATININE 1.21 1.07 1.09 1.23  CALCIUM 9.3 8.9 8.9 8.8*  PHOS  --   --   --  2.8   GFR: Estimated Creatinine Clearance: 89.1 mL/min (by C-G formula based on SCr of 1.23 mg/dL). Liver Function Tests: Recent Labs  Lab 03/10/19 1305 03/12/19 0614  AST 30  --   ALT 40  --   ALKPHOS 118*  --   BILITOT 0.4  --   PROT 8.1  --   ALBUMIN 3.6* 2.4*   No results for input(s): LIPASE, AMYLASE in the last 168 hours. No results for input(s): AMMONIA in the last 168 hours. Coagulation Profile: No results for  input(s): INR, PROTIME in the last 168 hours. Cardiac Enzymes: No results for input(s): CKTOTAL, CKMB, CKMBINDEX, TROPONINI in the last 168 hours. BNP (last 3 results) No results for input(s): PROBNP in the last 8760 hours. HbA1C: No results for input(s): HGBA1C in the last 72 hours. CBG: No results for input(s): GLUCAP in the last 168 hours. Lipid Profile: Recent Labs    03/10/19 1305  CHOL 119  HDL 32*  LDLCALC 62  TRIG 143  CHOLHDL 3.7   Thyroid Function Tests: No results for input(s): TSH, T4TOTAL, FREET4, T3FREE, THYROIDAB in the last 72 hours. Anemia Panel: No results for input(s): VITAMINB12, FOLATE, FERRITIN, TIBC, IRON, RETICCTPCT in the last 72 hours. Sepsis Labs: No results for input(s): PROCALCITON, LATICACIDVEN in the last 168 hours.  Recent Results (from the past 240 hour(s))  SARS CORONAVIRUS 2 (TAT 6-24 HRS) Nasopharyngeal Nasopharyngeal Swab  Status: None   Collection Time: 03/10/19  9:02 PM   Specimen: Nasopharyngeal Swab  Result Value Ref Range Status   SARS Coronavirus 2 NEGATIVE NEGATIVE Final    Comment: (NOTE) SARS-CoV-2 target nucleic acids are NOT DETECTED. The SARS-CoV-2 RNA is generally detectable in upper and lower respiratory specimens during the acute phase of infection. Negative results do not preclude SARS-CoV-2 infection, do not rule out co-infections with other pathogens, and should not be used as the sole basis for treatment or other patient management decisions. Negative results must be combined with clinical observations, patient history, and epidemiological information. The expected result is Negative. Fact Sheet for Patients: SugarRoll.be Fact Sheet for Healthcare Providers: https://www.woods-mathews.com/ This test is not yet approved or cleared by the [place] FDA and  has been authorized for detection and/or diagnosis of SARS-CoV-2 by FDA under an Emergency Use Authorization (EUA).  This EUA will remain  in effect (meaning this test can be used) for the duration of the COVID-19 declaration under Section 56 4(b)(1) of the Act, 21 U.S.C. section 360bbb-3(b)(1), unless the authorization is terminated or revoked sooner. Performed at [facility], [place] [address], [place], [place] [address]   Surgical pcr screen     Status: Abnormal   Collection Time: 03/12/19  5:13 AM   Specimen: Nasal Mucosa; Nasal Swab  Result Value Ref Range Status   MRSA, PCR POSITIVE (A) NEGATIVE Final    Comment: RESULT CALLED TO, READ BACK BY AND VERIFIED WITH: Lieutenant Diego RN 9:15 03/12/19 ([email or web address] )    Staphylococcus aureus POSITIVE (A) NEGATIVE Final    Comment: (NOTE) The Xpert SA Assay (FDA approved for NASAL specimens in patients 64 years of age and older), is one component of a comprehensive surveillance program. It is not intended to diagnose infection nor to guide or monitor treatment. Performed at [facility], [place] [address] [place], [place] [address]       Radiology Studies: Ct Soft Tissue Neck W Contrast  Result Date: 03/10/2019 CLINICAL DATA:  Sore throat/stridor, epiglottitis or tonsillitis suspected. EXAM: CT NECK WITH CONTRAST TECHNIQUE: Multidetector CT imaging of the neck was performed using the standard protocol following the bolus administration of intravenous contrast. CONTRAST:  28mL OMNIPAQUE IOHEXOL 300 MG/ML  SOLN COMPARISON:  No pertinent prior studies available for comparison. FINDINGS: Pharynx and larynx: Poor dentition with multiple absent teeth. There is prominent periapical lucency surrounding the two posterior-most right upper maxillary molars (series 5, image 54). Right parapharyngeal submucosal edema. In combination with large right-sided presumed neck abscess, there is mild/moderate effacement of the lower nasopharyngeal airway and mild effacement of the oropharyngeal airway. No discrete mass or swelling within the larynx. Salivary glands: The  presumed right neck abscess likely involves the right parotid gland. The right submandibular gland is poorly delineated, likely related to adjacent inflammatory change. The left parotid and submandibular glands are normal. Thyroid: Negative Lymph nodes: Right level II/III and bilateral level I lymphadenopathy is likely reactive. Vascular: Displacement and near complete effacement of the upper right internal jugular vein. Medial displacement of the right common and internal carotid arteries, with the right ICA having a partially retropharyngeal course. Limited intracranial: No abnormality identified. Visualized orbits: Visualized orbits demonstrate no acute abnormality. Mastoids and visualized paranasal sinuses: Moderate mucosal thickening within the inferior right maxillary sinus, suspected odontogenic in origin. No significant mastoid effusion. Skeleton: No acute bony abnormality. Cervical spondylosis without high-grade bony spinal canal narrowing. Upper chest: No consolidation within the imaged lung  apices. Other: There is a very large multiloculated necrotic focus within the right neck centered posterior to the right mandible, extending along the angle of the mandible and inferiorly to the level of the hyoid bone. This measures 5.0 x 6.5 x 8 cm (AP x TV x CC) (series 2, image 44) (series 4, image 61). There is likely involvement of the right parotid gland. There is posterior displacement and possible involvement of the right sternocleidomastoid muscle. There is posteromedial displacement and near complete effacement of the upper right internal jugular vein. There is medial displacement of the common and internal carotid arteries. This presumably reflects a large multiloculated abscess as there is surrounding soft tissue stranding and skin thickening. These results were called by telephone at the time of interpretation on 03/10/2019 at 7:26 pm to provider Dr. Dewayne Hatch, who verbally acknowledged these results.  IMPRESSION: 5.0 x 6.5 x 8 cm multiloculated presumed abscess within the upper right neck with surrounding inflammatory changes, as detailed. There is extension into the right parapharyngeal space with associated submucosal edema. Resultant mild/moderate narrowing of the lower nasopharyngeal airway and mild narrowing of the oropharyngeal airway. This may have began as necrotizing adenitis. Close clinical follow-up with repeat imaging as warranted to exclude alternative etiologies (necrotic mass). Posteromedial displacement and near complete effacement of the upper right internal jugular vein. Medial displacement of the right common and internal carotid arteries. The right ICA has a partially retropharyngeal course. Right level II/III and bilateral level I lymphadenopathy, likely reactive. Prominent periapical lucency surrounding the two posterior-most right maxillary teeth. Suspected odontogenic sinusitis within the inferior right maxillary sinus. Electronically Signed   By: Kellie Simmering DO   On: 03/10/2019 19:29      LOS: 2 days   Time spent: More than 50% of that time was spent in counseling and/or coordination of care.  Antonieta Pert, MD [facility]  03/12/2019, 12:26 PM

## 2019-03-13 DIAGNOSIS — E119 Type 2 diabetes mellitus without complications: Secondary | ICD-10-CM

## 2019-03-13 LAB — COMPREHENSIVE METABOLIC PANEL
ALT: 36 U/L (ref 0–44)
AST: 23 U/L (ref 15–41)
Albumin: 2.3 g/dL — ABNORMAL LOW (ref 3.5–5.0)
Alkaline Phosphatase: 62 U/L (ref 38–126)
Anion gap: 11 (ref 5–15)
BUN: 11 mg/dL (ref 6–20)
CO2: 25 mmol/L (ref 22–32)
Calcium: 9.1 mg/dL (ref 8.9–10.3)
Chloride: 99 mmol/L (ref 98–111)
Creatinine, Ser: 1.08 mg/dL (ref 0.61–1.24)
GFR calc Af Amer: >60 mL/min (ref >60)
GFR calc non Af Amer: >60 mL/min (ref >60)
Glucose, Bld: 272 mg/dL — ABNORMAL HIGH (ref 70–99)
Potassium: 4.1 mmol/L (ref 3.5–5.1)
Sodium: 135 mmol/L (ref 135–145)
Total Bilirubin: 0.5 mg/dL (ref 0.3–1.2)
Total Protein: 7.6 g/dL (ref 6.5–8.1)

## 2019-03-13 LAB — CBC
HCT: 33.9 % — ABNORMAL LOW (ref 39.0–52.0)
Hemoglobin: 11.5 g/dL — ABNORMAL LOW (ref 13.0–17.0)
MCH: 30.3 pg (ref 26.0–34.0)
MCHC: 33.9 g/dL (ref 30.0–36.0)
MCV: 89.4 fL (ref 80.0–100.0)
Platelets: 451 10*3/uL — ABNORMAL HIGH (ref 150–400)
RBC: 3.79 MIL/uL — ABNORMAL LOW (ref 4.22–5.81)
RDW: 12.9 % (ref 11.5–15.5)
WBC: 10.6 10*3/uL — ABNORMAL HIGH (ref 4.0–10.5)
nRBC: 0 % (ref 0.0–0.2)

## 2019-03-13 LAB — GLUCOSE, CAPILLARY
Glucose-Capillary: 375 mg/dL — ABNORMAL HIGH (ref 70–99)
Glucose-Capillary: 242 mg/dL — ABNORMAL HIGH (ref 70–99)
Glucose-Capillary: 119 mg/dL — ABNORMAL HIGH (ref 70–99)

## 2019-03-13 MED ORDER — LIVING WELL WITH DIABETES BOOK
Freq: Once | Status: AC
  Administered 2019-03-13: 15:00:00
  Filled 2019-03-13: qty 1

## 2019-03-13 MED ORDER — INSULIN ASPART 100 UNIT/ML ~~LOC~~ SOLN
0.0000 [IU] | Freq: Three times a day (TID) | SUBCUTANEOUS | Status: DC
  Administered 2019-03-13: 11 [IU] via SUBCUTANEOUS
  Administered 2019-03-13: 5 [IU] via SUBCUTANEOUS
  Administered 2019-03-13: 13:00:00 3 [IU] via SUBCUTANEOUS
  Administered 2019-03-14: 2 [IU] via SUBCUTANEOUS
  Administered 2019-03-14 – 2019-03-15 (×3): 3 [IU] via SUBCUTANEOUS
  Administered 2019-03-16: 5 [IU] via SUBCUTANEOUS
  Administered 2019-03-16: 3 [IU] via SUBCUTANEOUS

## 2019-03-13 MED ORDER — INSULIN ASPART 100 UNIT/ML ~~LOC~~ SOLN: 0.0000 [IU] | Freq: Every day | SUBCUTANEOUS | Status: DC

## 2019-03-13 MED ORDER — METFORMIN HCL 500 MG PO TABS
500.0000 mg | ORAL_TABLET | Freq: Two times a day (BID) | ORAL | Status: DC
  Administered 2019-03-13 – 2019-03-14 (×3): 500 mg via ORAL
  Filled 2019-03-13 (×3): qty 1

## 2019-03-13 MED ORDER — GLIPIZIDE 5 MG PO TABS
5.0000 mg | ORAL_TABLET | Freq: Every day | ORAL | Status: DC
  Administered 2019-03-13 – 2019-03-14 (×2): 5 mg via ORAL
  Filled 2019-03-13 (×2): qty 1

## 2019-03-13 NOTE — Progress Notes (Addendum)
Inpatient Diabetes Program Recommendations  AACE/ADA: New Consensus Statement on Inpatient Glycemic Control (2015)  Target Ranges:  Prepandial:   less than 140 mg/dL      Peak postprandial:   less than 180 mg/dL (1-2 hours)      Critically ill patients:  140 - 180 mg/dL   Lab Results  Component Value Date   GLUCAP 375 (H) 03/13/2019   HGBA1C 11.4 (H) 03/12/2019    Review of Glycemic Control Results for Casey Daniels, Casey Daniels (MRN [id number]) as of 03/13/2019 12:32  Ref. Range 03/13/2019 08:36  Glucose-Capillary Latest Ref Range: 70 - 99 mg/dL 375 (H)   Diabetes history: New diagnosis of DM Outpatient Diabetes medications: None Current orders for Inpatient glycemic control:  Novolog moderate tid with meals and HS Glucotrol 5 mg daily  Metformin 500 mg bid  Inpatient Diabetes Program Recommendations:    While in the hospital, consider adding basal insulin.   Consider adding  Lantus 20 units daily. Will see patient today regarding new diagnosis of DM.   Thanks Adah Perl, RN, BC-ADM Inpatient Diabetes Coordinator Pager [phone] Spoke with patient regarding new DM diagnosis.  Patient states that this is "new" for him.  His mother has DM too.  He states that he is living with her right now b/c his stepfather passed away recently.      Spoke with pt about new diagnosis.  Discussed A1C results with him and explained what an A1C is, basic pathophysiology of DM Type 2, basic home care, importance of checking CBGs and maintaining good CBG control to prevent long-term and short-term complications.  Reviewed signs and symptoms of hyperglycemia and hypoglycemia.  RNs to provide ongoing basic DM education at bedside with this patient.  Have ordered educational booklet and DM videos. Patient seems very concerned about diagnosis and states that he has not "told his mother yet".  He plans to call her.  He is very interested in outpatient education and states I am going to "stop drinking  juice and only drink water".  NT to allow patient to check blood sugar this evening for practice.  Briefly reviewed importance of eliminating sugar from beverages, reducing intake of starchy and fried foods, increasing consumption of fresh vegetables, etc.  Note plans for oral agents.  Agree, however may need insulin if bloods sugars not controlled prior to d/c.  He states that he has an MD in [place] that he can follow-up with as well. No further question at this time.  Will follow.

## 2019-03-13 NOTE — Progress Notes (Signed)
PROGRESS NOTE    Casey Daniels  [id number] DOB: [birth year] DOA: 03/10/2019 PCP: Sharion Balloon, FNP   Brief Narrative: 43 year old male in at PCP office 12/8 with 7-day history of right-sided jaw pain, seen 12 1 and tried ibuprofen amoxicillin, trismus is noted with difficulty swallowing so came to the ER found to have abscess ENT was consulted and was admitted.  Oral surgeon was also consult Dr. Glenford Peers  Subjective: Status post I and D this am. Ordering his meal this afternoon.  Complains of pain and has a dressing around the neck. Assessment & Plan:   Right deep neck abscess and parotid abscess:Status post I&D of complex right deep neck) abscess by ENT.  Appreciate input.  Continue current dressing plan to remove tomorrow.  Continue on iv clindamycin.Follow-up surgical wound : Growing few Staph aureus.  Afebrile and WBC count stable.  Will need to optimize his blood sugar to control infection.    New Onset type 2 diabetes mellitus A1c at 11.4.  Uncontrolled hyperglycemia.  Sugar 177-370 Consulted diabetic coordinator, discussed about long-term short-term complications dietary modification.  Started on glipizide and Metformin,ssi and we will up the po dose slowly.  Stop D5W.  Hx of asthma: continue Inhalers. Tobacco abuse cessation discussed. Thrombocytosis likely from #1.  MRSA + nasal screen  Body mass index is 27.35 kg/m.    DVT prophylaxis: SCD Code Status: Code Family Communication: plan of care discussed with patient at bedside. Disposition Plan: Remains inpatient for IV antibiotics, for hyperglycemia management and  for treatment of neck abscess  Consultants: ent  Procedures: I&D right neck abscess   CT neck " 5.0 x 6.5 x 8 cm multiloculated presumed abscess within the upper right neck with surrounding inflammatory changes, as detailed. There is extension into the right parapharyngeal space with associated submucosal edema. Resultant mild/moderate narrowing of  the lower nasopharyngeal airway and mild narrowing of the oropharyngeal airway. This may have began as necrotizing adenitis. Close clinical follow-up with repeat imaging as warranted to exclude alternative etiologies (necrotic mass). Posteromedial displacement and near complete effacement of the upper right internal jugular vein. Medial displacement of the right common and internal carotid arteries. The right ICA has a partially retropharyngeal course. Right level II/III and bilateral level I lymphadenopathy, likely reactive. Prominent periapical lucency surrounding the two posterior-most right maxillary teeth. Suspected odontogenic sinusitis within the inferior right maxillary sinu " Microbiology:  Antimicrobials: Anti-infectives (From admission, onward)   Start     Dose/Rate Route Frequency Ordered Stop   03/12/19 0856  polymyxin B 500,000 Units, bacitracin 50,000 Units in sodium chloride 0.9 % 500 mL irrigation  Status:  Discontinued       As needed 03/12/19 0856 03/12/19 0912   03/11/19 1800  cefTRIAXone (ROCEPHIN) 2 g in sodium chloride 0.9 % 100 mL IVPB  Status:  Discontinued     2 g 200 mL/hr over 30 Minutes Intravenous Every 24 hours 03/10/19 2221 03/11/19 0853   03/11/19 0500  clindamycin (CLEOCIN) IVPB 600 mg     600 mg 100 mL/hr over 30 Minutes Intravenous Every 8 hours 03/10/19 2221     03/10/19 2130  clindamycin (CLEOCIN) IVPB 900 mg     900 mg 100 mL/hr over 30 Minutes Intravenous  Once 03/10/19 2116 03/10/19 2202   03/10/19 1630  cefTRIAXone (ROCEPHIN) 2 g in sodium chloride 0.9 % 100 mL IVPB     2 g 200 mL/hr over 30 Minutes Intravenous  Once 03/10/19 1621 03/10/19 1815  Objective: Vitals:   03/12/19 1720 03/12/19 2340 03/13/19 0452 03/13/19 1249  BP: 91/65 (!) 133/96 (!) 133/95 113/84  Pulse: 96 75 73 79  Resp: 18 18 18 18   Temp: 98.1 F (36.7 C) 98.2 F (36.8 C) 98.1 F (36.7 C) 98.7 F (37.1 C)  TempSrc: Oral Oral Oral Oral  SpO2: 98% 99% 98% 98%  Weight:       Height:       No intake or output data in the 24 hours ending 03/13/19 1342 Filed Weights   03/10/19 1426  Weight: 96.6 kg   Weight change:   Body mass index is 27.35 kg/m.  Intake/Output from previous day: 12/09 0701 - 12/10 0700 In: 500 [I.V.:500] Out: 150 [Blood:150] Intake/Output this shift: No intake/output data recorded.  Examination:  General exam: AAOx3,NAD, Weak appearing. HEENT:Oral mucosa moist, surgical dressing present on the right neck and around  Respiratory system: bilaterally clear breath sounds,no use of accessory muscle Cardiovascular system: S1 & S2 +, No JVD,. Gastrointestinal system: Abdomen soft, NT,ND, BS+ Nervous System:Alert, awake, moving extremities and grossly nonfocal Extremities: No edema, distal peripheral pulses palpable.  Skin: No rashes,no icterus. MSK: Normal muscle bulk,tone, power  Medications:  Scheduled Meds: . glipiZIDE  5 mg Oral QAC breakfast  . insulin aspart  0-15 Units Subcutaneous TID WC  . insulin aspart  0-5 Units Subcutaneous QHS  . living well with diabetes book   Does not apply Once  . metFORMIN  500 mg Oral BID WC  . mometasone-formoterol  2 puff Inhalation BID   Continuous Infusions: . clindamycin (CLEOCIN) IV 600 mg (03/13/19 0458)  . dextrose 5 % and 0.9 % NaCl with KCl 20 mEq/L 100 mL/hr at 03/11/19 1355    Data Reviewed: I have personally reviewed following labs and imaging studies  CBC: Recent Labs  Lab 03/10/19 1305 03/10/19 1633 03/11/19 0452 03/12/19 0614 03/13/19 0354  WBC 13.3* 10.7* 11.9* 10.8* 10.6*  NEUTROABS 10.0* 8.2*  --  8.1*  --   HGB 14.0 13.2 12.7* 12.2* 11.5*  HCT 40.8 39.5 37.6* 37.0* 33.9*  MCV 88 90.6 90.0 91.6 89.4  PLT 467* 415* 428* 411* [id number]*   Basic Metabolic Panel: Recent Labs  Lab 03/10/19 1305 03/10/19 1633 03/11/19 0452 03/12/19 0614 03/13/19 0354  NA 131* 133* 132* 135 135  K 4.0 3.6 3.9 4.1 4.1  CL 92* 96* 98 100 99  CO2 21 25 24 24 25   GLUCOSE 239*  177* 224* 240* 272*  BUN 10 11 12 11 11   CREATININE 1.21 1.07 1.09 1.23 1.08  CALCIUM 9.3 8.9 8.9 8.8* 9.1  PHOS  --   --   --  2.8  --    GFR: Estimated Creatinine Clearance: 101.5 mL/min (by C-G formula based on SCr of 1.08 mg/dL). Liver Function Tests: Recent Labs  Lab 03/10/19 1305 03/12/19 0614 03/13/19 0354  AST 30  --  23  ALT 40  --  36  ALKPHOS 118*  --  62  BILITOT 0.4  --  0.5  PROT 8.1  --  7.6  ALBUMIN 3.6* 2.4* 2.3*   No results for input(s): LIPASE, AMYLASE in the last 168 hours. No results for input(s): AMMONIA in the last 168 hours. Coagulation Profile: No results for input(s): INR, PROTIME in the last 168 hours. Cardiac Enzymes: No results for input(s): CKTOTAL, CKMB, CKMBINDEX, TROPONINI in the last 168 hours. BNP (last 3 results) No results for input(s): PROBNP in the last 8760 hours. HbA1C: Recent  Labs    03/12/19 1251  HGBA1C 11.4*   CBG: Recent Labs  Lab 03/13/19 0836  GLUCAP 375*   Lipid Profile: No results for input(s): CHOL, HDL, LDLCALC, TRIG, CHOLHDL, LDLDIRECT in the last 72 hours. Thyroid Function Tests: No results for input(s): TSH, T4TOTAL, FREET4, T3FREE, THYROIDAB in the last 72 hours. Anemia Panel: No results for input(s): VITAMINB12, FOLATE, FERRITIN, TIBC, IRON, RETICCTPCT in the last 72 hours. Sepsis Labs: No results for input(s): PROCALCITON, LATICACIDVEN in the last 168 hours.  Recent Results (from the past 240 hour(s))  SARS CORONAVIRUS 2 (TAT 6-24 HRS) Nasopharyngeal Nasopharyngeal Swab     Status: None   Collection Time: 03/10/19  9:02 PM   Specimen: Nasopharyngeal Swab  Result Value Ref Range Status   SARS Coronavirus 2 NEGATIVE NEGATIVE Final    Comment: (NOTE) SARS-CoV-2 target nucleic acids are NOT DETECTED. The SARS-CoV-2 RNA is generally detectable in upper and lower respiratory specimens during the acute phase of infection. Negative results do not preclude SARS-CoV-2 infection, do not rule out co-infections  with other pathogens, and should not be used as the sole basis for treatment or other patient management decisions. Negative results must be combined with clinical observations, patient history, and epidemiological information. The expected result is Negative. Fact Sheet for Patients: SugarRoll.be Fact Sheet for Healthcare Providers: https://www.woods-mathews.com/ This test is not yet approved or cleared by the [place] FDA and  has been authorized for detection and/or diagnosis of SARS-CoV-2 by FDA under an Emergency Use Authorization (EUA). This EUA will remain  in effect (meaning this test can be used) for the duration of the COVID-19 declaration under Section 56 4(b)(1) of the Act, 21 U.S.C. section 360bbb-3(b)(1), unless the authorization is terminated or revoked sooner. Performed at [facility], [place] [address], [place], [place] [address]   Surgical pcr screen     Status: Abnormal   Collection Time: 03/12/19  5:13 AM   Specimen: Nasal Mucosa; Nasal Swab  Result Value Ref Range Status   MRSA, PCR POSITIVE (A) NEGATIVE Final    Comment: RESULT CALLED TO, READ BACK BY AND VERIFIED WITH: Lieutenant Diego RN 9:15 03/12/19 ([email or web address] )    Staphylococcus aureus POSITIVE (A) NEGATIVE Final    Comment: (NOTE) The Xpert SA Assay (FDA approved for NASAL specimens in patients 44 years of age and older), is one component of a comprehensive surveillance program. It is not intended to diagnose infection nor to guide or monitor treatment. Performed at [facility], [place] [address], [place], [place] [address]   Aerobic/Anaerobic Culture (surgical/deep wound)     Status: None (Preliminary result)   Collection Time: 03/12/19  8:27 AM   Specimen: PATH Lymph nodes regional; Tissue  Result Value Ref Range Status   Specimen Description WOUND NECK ABSCESS  Final   Special Requests PATIENT ON FOLLOWING CLINDAMYACIN  Final   Gram Stain NO WBC SEEN  RARE GRAM POSITIVE COCCI IN PAIRS   Final   Culture   Final    FEW STAPHYLOCOCCUS AUREUS SUSCEPTIBILITIES TO FOLLOW Performed at [facility], [place] [address] [place], [place] [address]    Report Status PENDING  Incomplete      Radiology Studies: No results found.    LOS: 3 days   Time spent: More than 50% of that time was spent in counseling and/or coordination of care.  Antonieta Pert, MD [facility]  03/13/2019, 1:42 PM

## 2019-03-13 NOTE — Progress Notes (Signed)
Subjective: Pt reports feeling better.  Objective: Vital signs in last 24 hours: Temp:  [97.7 F (36.5 C)-98.2 F (36.8 C)] 98.1 F (36.7 C) (12/10 0452) Pulse Rate:  [73-96] 73 (12/10 0452) Resp:  [16-18] 18 (12/10 0452) BP: (91-133)/(65-96) 133/95 (12/10 0452) SpO2:  [96 %-99 %] 98 % (12/10 0452)  Physical exam: General: Alert and oriented x 3. Head: NCAT. Eyes: PERRL, lids and conjunctivae normal Ears: Normal auricles and EACs. Mouth: Poor dentition.  One of his right maxillary molar is loose and tender to touch. Neck: Large swelling of his right upper neck, no stridor or wheezing appreciated. Incision site packed with xeroform packing. Respiratory: clear to auscultation bilaterally, no wheezing, no crackles. Normal respiratory effort.  Recent Labs    03/12/19 0614 03/13/19 0354  WBC 10.8* 10.6*  HGB 12.2* 11.5*  HCT 37.0* 33.9*  PLT 411* 451*   Recent Labs    03/12/19 0614 03/13/19 0354  NA 135 135  K 4.1 4.1  CL 100 99  CO2 24 25  GLUCOSE 240* 272*  BUN 11 11  CREATININE 1.23 1.08  CALCIUM 8.8* 9.1    Assessment/Plan: POD#1 s/p I&D.  - Clinically improved. - Continue IV abx. - Will remove Xeroform packing tomorrow.   LOS: 3 days   Lilli Dewald W Teniya Filter 03/13/2019, 9:48 AM

## 2019-03-14 ENCOUNTER — Encounter (HOSPITAL_COMMUNITY): Payer: Self-pay | Admitting: Internal Medicine

## 2019-03-14 LAB — GLUCOSE, CAPILLARY
Glucose-Capillary: 195 mg/dL — ABNORMAL HIGH (ref 70–99)
Glucose-Capillary: 172 mg/dL — ABNORMAL HIGH (ref 70–99)
Glucose-Capillary: 126 mg/dL — ABNORMAL HIGH (ref 70–99)
Glucose-Capillary: 140 mg/dL — ABNORMAL HIGH (ref 70–99)

## 2019-03-14 MED ORDER — METFORMIN HCL 850 MG PO TABS
850.0000 mg | ORAL_TABLET | Freq: Two times a day (BID) | ORAL | Status: DC
  Administered 2019-03-14 – 2019-03-16 (×5): 850 mg via ORAL
  Filled 2019-03-14 (×6): qty 1

## 2019-03-14 MED ORDER — GLIPIZIDE ER 10 MG PO TB24
10.0000 mg | ORAL_TABLET | Freq: Every day | ORAL | Status: DC
  Administered 2019-03-15 – 2019-03-16 (×2): 10 mg via ORAL
  Filled 2019-03-14 (×3): qty 1

## 2019-03-14 NOTE — Progress Notes (Signed)
Pt reports feeling better. Right neck abscess packing removed. Continue IV abx.

## 2019-03-14 NOTE — Plan of Care (Signed)
Nutrition Education Note  RD consulted for nutrition education regarding newly diagnosed diabetes.  Spoke with pt at bedside. Pt reports that his mother has diabetes and has been dealing with it "for years." Pt is somewhat familiar with nutrition recommendations for diabetes from watching his mother.  Pt reports that he typically has a good appetite and eats well. Pt reports that he drinks a lot of juice, drinks alcohol, and has not been eating the right foods ("I eat a lot of mashed potatoes, mac and cheese"). Pt reports that he does not drink much soda but does put a lot of sugar in his coffee.  Pt reports that what he "really likes" is salads and grilled chicken. Pt reports that he feels confident in his ability to make changes because "this diabetes is no joke." Pt is aware of the long-term ramifications of uncontrolled diabetes including retinopathy and loss of toes/feet.  Lab Results  Component Value Date   HGBA1C 11.4 (H) 03/12/2019    RD provided "Carbohydrate Counting for People with Diabetes" handout from the Academy of Nutrition and Dietetics. Discussed different food groups and their effects on blood sugar, emphasizing carbohydrate-containing foods. Provided list of carbohydrates and recommended serving sizes of common foods.  Discussed importance of controlled and consistent carbohydrate intake throughout the day. Provided examples of ways to balance meals/snacks and encouraged intake of high-fiber, whole grain complex carbohydrates. Teach back method used.  Expect fair compliance.  Body mass index is 27.35 kg/m. Pt meets criteria for overweight based on current BMI.  Current diet order is Carb Modified. No meal completions recorded at this time. Labs and medications reviewed. No further nutrition interventions warranted at this time. RD contact information provided. If additional nutrition issues arise, please re-consult RD.   Gaynell Face, MS, RD, LDN Inpatient  Clinical Dietitian Pager: [phone] Weekend/After Hours: [phone]

## 2019-03-14 NOTE — Progress Notes (Signed)
PROGRESS NOTE    Casey Daniels  [id number] DOB: 05-18-1975 DOA: 03/10/2019 PCP: Sharion Balloon, FNP   Brief Narrative: 43 year old male in at PCP office 12/8 with 7-day history of right-sided jaw pain, seen 12 1 and tried ibuprofen amoxicillin, trismus is noted with difficulty swallowing so came to the ER found to have abscess ENT was consulted and was admitted.  Oral surgeon was also consult Dr. Glenford Peers  Subjective:  C/o difficulty opening jaw on rt, pain stable, overall feeling well, afebrile, tolerating po Sugar improving in 170s  Assessment & Plan:   Right deep neck abscess and parotid abscess:Status post I&D of complex right deep neck) abscess by ENT.  Appreciate input.  Continue current dressing and await further ENT plan-Continue on iv clindamycin- surgical culture growing Staph aureus, I/d pending. Overall afebrile and WBC stable. Cont to optimize his blood sugar to control infection.    New Onset type 2 diabetes mellitus A1c at 11.4.  Uncontrolled hyperglycemia.  Sugar 177-370, today am at 17s- seen by  diabetic coordinator, discussed about long-term short-term complications dietary modification.  Started on glipizide and Metformin- will increase glipizide to 10 mg and metformin to 850 mg with goal to increase  To 100 mg bid pron discharge, cont ssi . Will ned DM suplies, meds on d/c  Hx of asthma: continue Inhalers. Tobacco abuse cessation discussed. Thrombocytosis likely from #1.  MRSA + nasal screen  Body mass index is 27.35 kg/m.    DVT prophylaxis: SCD Code Status: Code Family Communication: plan of care discussed with patient at bedside. Disposition Plan: Remains inpatient for IV antibiotics, for hyperglycemia management and  for treatment of neck abscess. Home once okay in few days  Consultants: ent  Procedures: I&D right neck abscess  CT neck " 5.0 x 6.5 x 8 cm multiloculated presumed abscess within the upper right neck with surrounding inflammatory  changes, as detailed. There is extension into the right parapharyngeal space with associated submucosal edema. Resultant mild/moderate narrowing of the lower nasopharyngeal airway and mild narrowing of the oropharyngeal airway. This may have began as necrotizing adenitis. Close clinical follow-up with repeat imaging as warranted to exclude alternative etiologies (necrotic mass). Posteromedial displacement and near complete effacement of the upper right internal jugular vein. Medial displacement of the right common and internal carotid arteries. The right ICA has a partially retropharyngeal course. Right level II/III and bilateral level I lymphadenopathy, likely reactive. Prominent periapical lucency surrounding the two posterior-most right maxillary teeth. Suspected odontogenic sinusitis within the inferior right maxillary sinu " Microbiology: Staph aureus in wound culture  Antimicrobials: Anti-infectives (From admission, onward)   Start     Dose/Rate Route Frequency Ordered Stop   03/12/19 0856  polymyxin B 500,000 Units, bacitracin 50,000 Units in sodium chloride 0.9 % 500 mL irrigation  Status:  Discontinued       As needed 03/12/19 0856 03/12/19 0912   03/11/19 1800  cefTRIAXone (ROCEPHIN) 2 g in sodium chloride 0.9 % 100 mL IVPB  Status:  Discontinued     2 g 200 mL/hr over 30 Minutes Intravenous Every 24 hours 03/10/19 2221 03/11/19 0853   03/11/19 0500  clindamycin (CLEOCIN) IVPB 600 mg     600 mg 100 mL/hr over 30 Minutes Intravenous Every 8 hours 03/10/19 2221     03/10/19 2130  clindamycin (CLEOCIN) IVPB 900 mg     900 mg 100 mL/hr over 30 Minutes Intravenous  Once 03/10/19 2116 03/10/19 2202   03/10/19 1630  cefTRIAXone (ROCEPHIN)  2 g in sodium chloride 0.9 % 100 mL IVPB     2 g 200 mL/hr over 30 Minutes Intravenous  Once 03/10/19 1621 03/10/19 1815       Objective: Vitals:   03/13/19 2342 03/14/19 0507 03/14/19 0817 03/14/19 1321  BP: 117/81 118/84  130/90  Pulse: 75 82 89 82   Resp: 16 16 18 16   Temp: 98.1 F (36.7 C) 97.9 F (36.6 C)  98.2 F (36.8 C)  TempSrc: Oral Oral  Oral  SpO2: 97% 95% 98% 98%  Weight:      Height:        Intake/Output Summary (Last 24 hours) at 03/14/2019 1333 Last data filed at 03/14/2019 0442 Gross per 24 hour  Intake 340 ml  Output --  Net 340 ml   Filed Weights   03/10/19 1426  Weight: 96.6 kg   Weight change:   Body mass index is 27.35 kg/m.  Intake/Output from previous day: 12/10 0701 - 12/11 0700 In: 340 [P.O.:240; IV Piggyback:100] Out: -  Intake/Output this shift: No intake/output data recorded.  Examination:  General exam:  AA0X3, NAD, on RA HEENT:Oral mucosa moist, surgical dressing present in the neck with pad on the right neck  Respiratory system: Bilaterally clear breath sound, nontender, no use of accessory muscle Cardiovascular system: S1 & S2 +, No JVD,. Gastrointestinal system: Abdomen soft, NT,ND, BS+ Nervous System:Alert, awake, moving extremities and grossly nonfocal Extremities: No edema, distal peripheral pulses palpable.  Skin: No rashes,no icterus. MSK: Normal muscle bulk,tone, power  Medications:  Scheduled Meds: . glipiZIDE  5 mg Oral QAC breakfast  . insulin aspart  0-15 Units Subcutaneous TID WC  . insulin aspart  0-5 Units Subcutaneous QHS  . metFORMIN  500 mg Oral BID WC  . mometasone-formoterol  2 puff Inhalation BID   Continuous Infusions: . clindamycin (CLEOCIN) IV 600 mg (03/14/19 1300)    Data Reviewed: I have personally reviewed following labs and imaging studies  CBC: Recent Labs  Lab 03/10/19 1305 03/10/19 1633 03/11/19 0452 03/12/19 0614 03/13/19 0354  WBC 13.3* 10.7* 11.9* 10.8* 10.6*  NEUTROABS 10.0* 8.2*  --  8.1*  --   HGB 14.0 13.2 12.7* 12.2* 11.5*  HCT 40.8 39.5 37.6* 37.0* 33.9*  MCV 88 90.6 90.0 91.6 89.4  PLT 467* 415* 428* 411* [id number]*   Basic Metabolic Panel: Recent Labs  Lab 03/10/19 1305 03/10/19 1633 03/11/19 0452 03/12/19 0614  03/13/19 0354  NA 131* 133* 132* 135 135  K 4.0 3.6 3.9 4.1 4.1  CL 92* 96* 98 100 99  CO2 21 25 24 24 25   GLUCOSE 239* 177* 224* 240* 272*  BUN 10 11 12 11 11   CREATININE 1.21 1.07 1.09 1.23 1.08  CALCIUM 9.3 8.9 8.9 8.8* 9.1  PHOS  --   --   --  2.8  --    GFR: Estimated Creatinine Clearance: 101.5 mL/min (by C-G formula based on SCr of 1.08 mg/dL). Liver Function Tests: Recent Labs  Lab 03/10/19 1305 03/12/19 0614 03/13/19 0354  AST 30  --  23  ALT 40  --  36  ALKPHOS 118*  --  62  BILITOT 0.4  --  0.5  PROT 8.1  --  7.6  ALBUMIN 3.6* 2.4* 2.3*   No results for input(s): LIPASE, AMYLASE in the last 168 hours. No results for input(s): AMMONIA in the last 168 hours. Coagulation Profile: No results for input(s): INR, PROTIME in the last 168 hours. Cardiac Enzymes: No results  for input(s): CKTOTAL, CKMB, CKMBINDEX, TROPONINI in the last 168 hours. BNP (last 3 results) No results for input(s): PROBNP in the last 8760 hours. HbA1C: Recent Labs    03/12/19 1251  HGBA1C 11.4*   CBG: Recent Labs  Lab 03/13/19 0836 03/13/19 1618 03/13/19 2130 03/14/19 0626 03/14/19 1116  GLUCAP 375* 242* 119* 195* 172*   Lipid Profile: No results for input(s): CHOL, HDL, LDLCALC, TRIG, CHOLHDL, LDLDIRECT in the last 72 hours. Thyroid Function Tests: No results for input(s): TSH, T4TOTAL, FREET4, T3FREE, THYROIDAB in the last 72 hours. Anemia Panel: No results for input(s): VITAMINB12, FOLATE, FERRITIN, TIBC, IRON, RETICCTPCT in the last 72 hours. Sepsis Labs: No results for input(s): PROCALCITON, LATICACIDVEN in the last 168 hours.  Recent Results (from the past 240 hour(s))  SARS CORONAVIRUS 2 (TAT 6-24 HRS) Nasopharyngeal Nasopharyngeal Swab     Status: None   Collection Time: 03/10/19  9:02 PM   Specimen: Nasopharyngeal Swab  Result Value Ref Range Status   SARS Coronavirus 2 NEGATIVE NEGATIVE Final    Comment: (NOTE) SARS-CoV-2 target nucleic acids are NOT  DETECTED. The SARS-CoV-2 RNA is generally detectable in upper and lower respiratory specimens during the acute phase of infection. Negative results do not preclude SARS-CoV-2 infection, do not rule out co-infections with other pathogens, and should not be used as the sole basis for treatment or other patient management decisions. Negative results must be combined with clinical observations, patient history, and epidemiological information. The expected result is Negative. Fact Sheet for Patients: SugarRoll.be Fact Sheet for Healthcare Providers: https://www.woods-mathews.com/ This test is not yet approved or cleared by the [place] FDA and  has been authorized for detection and/or diagnosis of SARS-CoV-2 by FDA under an Emergency Use Authorization (EUA). This EUA will remain  in effect (meaning this test can be used) for the duration of the COVID-19 declaration under Section 56 4(b)(1) of the Act, 21 U.S.C. section 360bbb-3(b)(1), unless the authorization is terminated or revoked sooner. Performed at [facility], [place] [address] [place], [place] [address]   Surgical pcr screen     Status: Abnormal   Collection Time: 03/12/19  5:13 AM   Specimen: Nasal Mucosa; Nasal Swab  Result Value Ref Range Status   MRSA, PCR POSITIVE (A) NEGATIVE Final    Comment: RESULT CALLED TO, READ BACK BY AND VERIFIED WITH: Lieutenant Diego RN 9:15 03/12/19 ([email or web address] )    Staphylococcus aureus POSITIVE (A) NEGATIVE Final    Comment: (NOTE) The Xpert SA Assay (FDA approved for NASAL specimens in patients 2 years of age and older), is one component of a comprehensive surveillance program. It is not intended to diagnose infection nor to guide or monitor treatment. Performed at [facility], [place] [address], [place], [place] [address]   Aerobic/Anaerobic Culture (surgical/deep wound)     Status: None (Preliminary result)   Collection Time: 03/12/19  8:27  AM   Specimen: PATH Lymph nodes regional; Tissue  Result Value Ref Range Status   Specimen Description WOUND NECK ABSCESS  Final   Special Requests PATIENT ON FOLLOWING CLINDAMYACIN  Final   Gram Stain   Final    NO WBC SEEN RARE GRAM POSITIVE COCCI IN PAIRS Performed at [facility], [place] [address] [place], [place] [address]    Culture   Final    FEW METHICILLIN RESISTANT STAPHYLOCOCCUS AUREUS NO ANAEROBES ISOLATED; CULTURE IN PROGRESS FOR 5 DAYS    Report Status PENDING  Incomplete   Organism  ID, Bacteria METHICILLIN RESISTANT STAPHYLOCOCCUS AUREUS  Final      Susceptibility   Methicillin resistant staphylococcus aureus - MIC*    CIPROFLOXACIN <=0.5 SENSITIVE Sensitive     ERYTHROMYCIN >=8 RESISTANT Resistant     GENTAMICIN <=0.5 SENSITIVE Sensitive     OXACILLIN >=4 RESISTANT Resistant     TETRACYCLINE <=1 SENSITIVE Sensitive     VANCOMYCIN <=0.5 SENSITIVE Sensitive     TRIMETH/SULFA <=10 SENSITIVE Sensitive     CLINDAMYCIN <=0.25 SENSITIVE Sensitive     RIFAMPIN <=0.5 SENSITIVE Sensitive     Inducible Clindamycin NEGATIVE Sensitive     * FEW METHICILLIN RESISTANT STAPHYLOCOCCUS AUREUS      Radiology Studies: No results found.    LOS: 4 days   Time spent: More than 50% of that time was spent in counseling and/or coordination of care.  Antonieta Pert, MD [facility]  03/14/2019, 1:33 PM

## 2019-03-14 NOTE — Progress Notes (Signed)
Inpatient Diabetes Program Recommendations  AACE/ADA: New Consensus Statement on Inpatient Glycemic Control (2015)  Target Ranges:  Prepandial:   less than 140 mg/dL      Peak postprandial:   less than 180 mg/dL (1-2 hours)      Critically ill patients:  140 - 180 mg/dL   Lab Results  Component Value Date   GLUCAP 195 (H) 03/14/2019   HGBA1C 11.4 (H) 03/12/2019    Review of Glycemic Control Results for SHYLOH, LOURO (MRN [id number]) as of 03/14/2019 09:50  Ref. Range 03/13/2019 08:36 03/13/2019 16:18 03/13/2019 21:30 03/14/2019 06:26  Glucose-Capillary Latest Ref Range: 70 - 99 mg/dL 375 (H) 242 (H) 119 (H) 195 (H)   Diabetes history: New diagnosis of DM Outpatient Diabetes medications: None Current orders for Inpatient glycemic control:  Novolog moderate tid with meals and HS Glucotrol 5 mg daily  Metformin 500 mg bid  Inpatient Diabetes Program Recommendations:    Pt seen and educated 12/10 Fasting glucose 190's this am.   Consider increasing Glipizide to 5 mg bid.  Thanks Tama Headings RN, MSN, BC-ADM Inpatient Diabetes Coordinator Team Pager [phone] (8a-5p)

## 2019-03-15 ENCOUNTER — Encounter (HOSPITAL_COMMUNITY): Payer: Self-pay | Admitting: Internal Medicine

## 2019-03-15 LAB — GLUCOSE, CAPILLARY
Glucose-Capillary: 170 mg/dL — ABNORMAL HIGH (ref 70–99)
Glucose-Capillary: 108 mg/dL — ABNORMAL HIGH (ref 70–99)
Glucose-Capillary: 120 mg/dL — ABNORMAL HIGH (ref 70–99)
Glucose-Capillary: 138 mg/dL — ABNORMAL HIGH (ref 70–99)

## 2019-03-15 LAB — BASIC METABOLIC PANEL
Anion gap: 11 (ref 5–15)
BUN: 15 mg/dL (ref 6–20)
CO2: 23 mmol/L (ref 22–32)
Calcium: 9.3 mg/dL (ref 8.9–10.3)
Chloride: 102 mmol/L (ref 98–111)
Creatinine, Ser: 1.1 mg/dL (ref 0.61–1.24)
GFR calc Af Amer: >60 mL/min (ref >60)
GFR calc non Af Amer: >60 mL/min (ref >60)
Glucose, Bld: 151 mg/dL — ABNORMAL HIGH (ref 70–99)
Potassium: 3.9 mmol/L (ref 3.5–5.1)
Sodium: 136 mmol/L (ref 135–145)

## 2019-03-15 MED ORDER — SODIUM CHLORIDE 0.9 % IV SOLN
INTRAVENOUS | Status: DC | PRN
  Administered 2019-03-15: 250 mL via INTRAVENOUS

## 2019-03-15 MED ORDER — MUPIROCIN CALCIUM 2 % EX CREA
TOPICAL_CREAM | Freq: Two times a day (BID) | CUTANEOUS | Status: DC
  Administered 2019-03-15 – 2019-03-16 (×2): via TOPICAL
  Filled 2019-03-15: qty 15

## 2019-03-15 NOTE — Plan of Care (Signed)
  Problem: Clinical Measurements: Goal: Will remain free from infection Outcome: Completed/Met Goal: Respiratory complications will improve Outcome: Completed/Met   Problem: Nutrition: Goal: Adequate nutrition will be maintained Outcome: Completed/Met   Problem: Coping: Goal: Level of anxiety will decrease Outcome: Completed/Met   Problem: Elimination: Goal: Will not experience complications related to urinary retention Outcome: Completed/Met   Problem: Safety: Goal: Ability to remain free from injury will improve Outcome: Completed/Met   Problem: Skin Integrity: Goal: Risk for impaired skin integrity will decrease Outcome: Completed/Met

## 2019-03-15 NOTE — Progress Notes (Signed)
PROGRESS NOTE    Casey Daniels  [id number] DOB: 05/26/1975 DOA: 03/10/2019 PCP: Sharion Balloon, FNP   Brief Narrative: 43 year old male in at PCP office 12/8 with 7-day history of right-sided jaw pain, seen 12 1 and tried ibuprofen amoxicillin, trismus is noted with difficulty swallowing so came to the ER found to have abscess ENT was consulted and was admitted.  Oral surgeon was also consult Dr. Glenford Peers  03/15/2019: Patient seen.  ENT/oral surgery input is appreciated.  IV antibiotics will be continued for now.  Nasal swab was positive for MRSA.  Surgical cultures growing MRSA as well.  Patient is currently on IV clindamycin  Subjective: No new complaints. No fever or chills  Assessment & Plan:   Right deep neck abscess and parotid abscess:Status post I&D of complex right deep neck) abscess by ENT.  Appreciate input.  Continue current dressing and await further ENT plan-Continue on iv clindamycin- surgical culture growing Staph aureus, I/d pending. Overall afebrile and WBC stable. Cont to optimize his blood sugar to control infection. 03/15/2019: Patient is status post I&D.  Cultures as documented above.  Continue IV antibiotics.  Start patient on mupirocin to the nares.  New Onset type 2 diabetes mellitus A1c at 11.4.  Uncontrolled hyperglycemia.  Sugar 177-370, today am at 17s- seen by  diabetic coordinator, discussed about long-term short-term complications dietary modification.  Started on glipizide and Metformin- will increase glipizide to 10 mg and metformin to 850 mg with goal to increase  To 100 mg bid pron discharge, cont ssi . Will ned DM suplies, meds on d/c 03/15/2019: Continue to optimize.  Hx of asthma: continue Inhalers. Tobacco abuse cessation discussed. Thrombocytosis likely from #1.  MRSA + nasal screen  Body mass index is 27.35 kg/m.    DVT prophylaxis: SCD Code Status: Code Family Communication: plan of care discussed with patient at  bedside. Disposition Plan: Remains inpatient for IV antibiotics, for hyperglycemia management and  for treatment of neck abscess. Home once okay in few days  Consultants: ent  Procedures: I&D right neck abscess  CT neck " 5.0 x 6.5 x 8 cm multiloculated presumed abscess within the upper right neck with surrounding inflammatory changes, as detailed. There is extension into the right parapharyngeal space with associated submucosal edema. Resultant mild/moderate narrowing of the lower nasopharyngeal airway and mild narrowing of the oropharyngeal airway. This may have began as necrotizing adenitis. Close clinical follow-up with repeat imaging as warranted to exclude alternative etiologies (necrotic mass). Posteromedial displacement and near complete effacement of the upper right internal jugular vein. Medial displacement of the right common and internal carotid arteries. The right ICA has a partially retropharyngeal course. Right level II/III and bilateral level I lymphadenopathy, likely reactive. Prominent periapical lucency surrounding the two posterior-most right maxillary teeth. Suspected odontogenic sinusitis within the inferior right maxillary sinu " Microbiology: Staph aureus in wound culture  Antimicrobials: Anti-infectives (From admission, onward)   Start     Dose/Rate Route Frequency Ordered Stop   03/12/19 0856  polymyxin B 500,000 Units, bacitracin 50,000 Units in sodium chloride 0.9 % 500 mL irrigation  Status:  Discontinued       As needed 03/12/19 0856 03/12/19 0912   03/11/19 1800  cefTRIAXone (ROCEPHIN) 2 g in sodium chloride 0.9 % 100 mL IVPB  Status:  Discontinued     2 g 200 mL/hr over 30 Minutes Intravenous Every 24 hours 03/10/19 2221 03/11/19 0853   03/11/19 0500  clindamycin (CLEOCIN) IVPB 600 mg  600 mg 100 mL/hr over 30 Minutes Intravenous Every 8 hours 03/10/19 2221     03/10/19 2130  clindamycin (CLEOCIN) IVPB 900 mg     900 mg 100 mL/hr over 30 Minutes Intravenous   Once 03/10/19 2116 03/10/19 2202   03/10/19 1630  cefTRIAXone (ROCEPHIN) 2 g in sodium chloride 0.9 % 100 mL IVPB     2 g 200 mL/hr over 30 Minutes Intravenous  Once 03/10/19 1621 03/10/19 1815       Objective: Vitals:   03/14/19 1813 03/15/19 0004 03/15/19 0613 03/15/19 1224  BP: 132/78 135/87 126/84 113/75  Pulse: 87 78 80 99  Resp: 16 16 18 18   Temp: 98.4 F (36.9 C) 97.8 F (36.6 C) 98.2 F (36.8 C) 97.9 F (36.6 C)  TempSrc: Oral Oral Oral Oral  SpO2: 99% 97% 98% 96%  Weight:      Height:        Intake/Output Summary (Last 24 hours) at 03/15/2019 1425 Last data filed at 03/15/2019 0910 Gross per 24 hour  Intake 800 ml  Output --  Net 800 ml   Filed Weights   03/10/19 1426  Weight: 96.6 kg   Weight change:   Body mass index is 27.35 kg/m.  Intake/Output from previous day: 12/11 0701 - 12/12 0700 In: 510 [P.O.:360; IV Piggyback:150] Out: -  Intake/Output this shift: Total I/O In: 290 [P.O.:240; IV Piggyback:50] Out: -   Examination:  General exam: Awake and alert.  Patient is not in any distress.   HEENT:Oral mucosa moist, surgical dressing present in the neck with pad on the right neck  Respiratory system: Bilaterally clear breath sound, nontender, no use of accessory muscle Cardiovascular system: S1 & S2 +, No JVD,. Gastrointestinal system: Abdomen soft, NT,ND, BS+ Nervous System:Alert, awake, moving extremities and grossly nonfocal Extremities: No leg edema  Medications:  Scheduled Meds: . glipiZIDE  10 mg Oral Q breakfast  . insulin aspart  0-15 Units Subcutaneous TID WC  . insulin aspart  0-5 Units Subcutaneous QHS  . metFORMIN  850 mg Oral BID WC  . mometasone-formoterol  2 puff Inhalation BID   Continuous Infusions: . sodium chloride 250 mL (03/15/19 1344)  . clindamycin (CLEOCIN) IV 600 mg (03/15/19 1345)    Data Reviewed: I have personally reviewed following labs and imaging studies  CBC: Recent Labs  Lab 03/10/19 1305  03/10/19 1633 03/11/19 0452 03/12/19 0614 03/13/19 0354  WBC 13.3* 10.7* 11.9* 10.8* 10.6*  NEUTROABS 10.0* 8.2*  --  8.1*  --   HGB 14.0 13.2 12.7* 12.2* 11.5*  HCT 40.8 39.5 37.6* 37.0* 33.9*  MCV 88 90.6 90.0 91.6 89.4  PLT 467* 415* 428* 411* [id number]*   Basic Metabolic Panel: Recent Labs  Lab 03/10/19 1633 03/11/19 0452 03/12/19 0614 03/13/19 0354 03/15/19 0513  NA 133* 132* 135 135 136  K 3.6 3.9 4.1 4.1 3.9  CL 96* 98 100 99 102  CO2 25 24 24 25 23   GLUCOSE 177* 224* 240* 272* 151*  BUN 11 12 11 11 15   CREATININE 1.07 1.09 1.23 1.08 1.10  CALCIUM 8.9 8.9 8.8* 9.1 9.3  PHOS  --   --  2.8  --   --    GFR: Estimated Creatinine Clearance: 99.6 mL/min (by C-G formula based on SCr of 1.1 mg/dL). Liver Function Tests: Recent Labs  Lab 03/10/19 1305 03/12/19 0614 03/13/19 0354  AST 30  --  23  ALT 40  --  36  ALKPHOS 118*  --  62  BILITOT 0.4  --  0.5  PROT 8.1  --  7.6  ALBUMIN 3.6* 2.4* 2.3*   No results for input(s): LIPASE, AMYLASE in the last 168 hours. No results for input(s): AMMONIA in the last 168 hours. Coagulation Profile: No results for input(s): INR, PROTIME in the last 168 hours. Cardiac Enzymes: No results for input(s): CKTOTAL, CKMB, CKMBINDEX, TROPONINI in the last 168 hours. BNP (last 3 results) No results for input(s): PROBNP in the last 8760 hours. HbA1C: No results for input(s): HGBA1C in the last 72 hours. CBG: Recent Labs  Lab 03/14/19 1116 03/14/19 1623 03/14/19 2146 03/15/19 0610 03/15/19 1118  GLUCAP 172* 126* 140* 170* 108*   Lipid Profile: No results for input(s): CHOL, HDL, LDLCALC, TRIG, CHOLHDL, LDLDIRECT in the last 72 hours. Thyroid Function Tests: No results for input(s): TSH, T4TOTAL, FREET4, T3FREE, THYROIDAB in the last 72 hours. Anemia Panel: No results for input(s): VITAMINB12, FOLATE, FERRITIN, TIBC, IRON, RETICCTPCT in the last 72 hours. Sepsis Labs: No results for input(s): PROCALCITON, LATICACIDVEN in the  last 168 hours.  Recent Results (from the past 240 hour(s))  SARS CORONAVIRUS 2 (TAT 6-24 HRS) Nasopharyngeal Nasopharyngeal Swab     Status: None   Collection Time: 03/10/19  9:02 PM   Specimen: Nasopharyngeal Swab  Result Value Ref Range Status   SARS Coronavirus 2 NEGATIVE NEGATIVE Final    Comment: (NOTE) SARS-CoV-2 target nucleic acids are NOT DETECTED. The SARS-CoV-2 RNA is generally detectable in upper and lower respiratory specimens during the acute phase of infection. Negative results do not preclude SARS-CoV-2 infection, do not rule out co-infections with other pathogens, and should not be used as the sole basis for treatment or other patient management decisions. Negative results must be combined with clinical observations, patient history, and epidemiological information. The expected result is Negative. Fact Sheet for Patients: SugarRoll.be Fact Sheet for Healthcare Providers: https://www.woods-mathews.com/ This test is not yet approved or cleared by the [place] FDA and  has been authorized for detection and/or diagnosis of SARS-CoV-2 by FDA under an Emergency Use Authorization (EUA). This EUA will remain  in effect (meaning this test can be used) for the duration of the COVID-19 declaration under Section 56 4(b)(1) of the Act, 21 U.S.C. section 360bbb-3(b)(1), unless the authorization is terminated or revoked sooner. Performed at [facility], [place] [address], [place], [place] [address]   Surgical pcr screen     Status: Abnormal   Collection Time: 03/12/19  5:13 AM   Specimen: Nasal Mucosa; Nasal Swab  Result Value Ref Range Status   MRSA, PCR POSITIVE (A) NEGATIVE Final    Comment: RESULT CALLED TO, READ BACK BY AND VERIFIED WITH: Lieutenant Diego RN 9:15 03/12/19 ([email or web address] )    Staphylococcus aureus POSITIVE (A) NEGATIVE Final    Comment: (NOTE) The Xpert SA Assay (FDA approved for NASAL specimens in patients  92 years of age and older), is one component of a comprehensive surveillance program. It is not intended to diagnose infection nor to guide or monitor treatment. Performed at [facility], [place] [address], [place], [place] [address]   Aerobic/Anaerobic Culture (surgical/deep wound)     Status: None (Preliminary result)   Collection Time: 03/12/19  8:27 AM   Specimen: PATH Lymph nodes regional; Tissue  Result Value Ref Range Status   Specimen Description WOUND NECK ABSCESS  Final   Special Requests PATIENT ON FOLLOWING CLINDAMYACIN  Final   Gram Stain   Final  NO WBC SEEN RARE GRAM POSITIVE COCCI IN PAIRS Performed at [facility], [place] [address], [place], [place] [address]    Culture   Final    FEW METHICILLIN RESISTANT STAPHYLOCOCCUS AUREUS NO ANAEROBES ISOLATED; CULTURE IN PROGRESS FOR 5 DAYS    Report Status PENDING  Incomplete   Organism ID, Bacteria METHICILLIN RESISTANT STAPHYLOCOCCUS AUREUS  Final      Susceptibility   Methicillin resistant staphylococcus aureus - MIC*    CIPROFLOXACIN <=0.5 SENSITIVE Sensitive     ERYTHROMYCIN >=8 RESISTANT Resistant     GENTAMICIN <=0.5 SENSITIVE Sensitive     OXACILLIN >=4 RESISTANT Resistant     TETRACYCLINE <=1 SENSITIVE Sensitive     VANCOMYCIN <=0.5 SENSITIVE Sensitive     TRIMETH/SULFA <=10 SENSITIVE Sensitive     CLINDAMYCIN <=0.25 SENSITIVE Sensitive     RIFAMPIN <=0.5 SENSITIVE Sensitive     Inducible Clindamycin NEGATIVE Sensitive     * FEW METHICILLIN RESISTANT STAPHYLOCOCCUS AUREUS      Radiology Studies: No results found.    LOS: 5 days   Time spent: More than 50% of that time was spent in counseling and/or coordination of care.  Bonnell Public, MD [facility]  03/15/2019, 2:25 PM

## 2019-03-15 NOTE — Progress Notes (Signed)
Pt reports feeling significantly better. Right neck swelling has decreased. Culture shows MRSA, sensitive to clinda. Consider d/c home on oral clinda (e.g. 300mg  po QID for 10 days). He may follow up with me as an outpatient in 1 week.

## 2019-03-15 NOTE — Progress Notes (Signed)
Patient resting comfortably during shift report. Denies complaints.  

## 2019-03-16 LAB — GLUCOSE, CAPILLARY
Glucose-Capillary: 160 mg/dL — ABNORMAL HIGH (ref 70–99)
Glucose-Capillary: 227 mg/dL — ABNORMAL HIGH (ref 70–99)
Glucose-Capillary: 105 mg/dL — ABNORMAL HIGH (ref 70–99)

## 2019-03-16 MED ORDER — GLIPIZIDE ER 10 MG PO TB24: 10.0000 mg | ORAL_TABLET | Freq: Every day | ORAL | 0 refills | Status: DC

## 2019-03-16 MED ORDER — OXYCODONE HCL 5 MG PO TABS: 5.0000 mg | ORAL_TABLET | ORAL | 0 refills | Status: DC | PRN

## 2019-03-16 MED ORDER — MUPIROCIN CALCIUM 2 % EX CREA: TOPICAL_CREAM | Freq: Two times a day (BID) | CUTANEOUS | 0 refills | Status: DC

## 2019-03-16 MED ORDER — METFORMIN HCL 1000 MG PO TABS: 1000.0000 mg | ORAL_TABLET | Freq: Two times a day (BID) | ORAL | 0 refills | Status: DC

## 2019-03-16 MED ORDER — CLINDAMYCIN HCL 300 MG PO CAPS: 300.0000 mg | ORAL_CAPSULE | Freq: Four times a day (QID) | ORAL | 0 refills | Status: AC

## 2019-03-16 MED ORDER — ATORVASTATIN CALCIUM 20 MG PO TABS: 20.0000 mg | ORAL_TABLET | Freq: Every day | ORAL | 0 refills | Status: DC

## 2019-03-16 NOTE — Discharge Summary (Signed)
Physician Discharge Summary  Patient ID: Casey Daniels MRN: [id number] DOB/AGE: 1975/11/29 43 y.o.  Admit date: 03/10/2019 Discharge date: 03/16/2019  Admission Diagnoses:  Discharge Diagnoses:  Principal Problem:   Right deep neck/parotid abscess.   Active Problems:   Neck abscess   DM type 2 (diabetes mellitus, type 2) ([place])   Discharged Condition: stable  Hospital Course: Patient is a 43 year old African-American male with history significant for asthma and arthritis.  Presented with 7-day history of neck swelling, right-sided jaw/neck pain, difficulty swallowing and trismus.  Work-up done revealed 5.0 x 6.5 x 8 cm multiloculated presumed abscess within the upper right neck with surrounding inflammatory changes.  Patient was admitted for further assessment and management.  ENT was consulted to assist with patient's management.  Patient was started on broad-spectrum antibiotics.  Patient underwent incision and drainage, and the culture grew MRSA.  IV clindamycin was continued during the hospital stay.  Patient will be discharged on oral clindamycin.  ENT team has cleared patient for discharge.  Patient will continue wound dressing.  Patient will follow with PCP and ENT within 1 week of discharge.  During the hospital stay, patient was noted to be diabetic.  HbA1c was 11.4.  Patient has been managed with Metformin and Glucotrol XL.  Patient be discharged back home on both medications.  Patient has been instructed to check blood sugar at least 4 times daily, document readings and review with your primary care provider next visit.  Patient will also be started on Lipitor.  Patient is eager to be discharged back home.  Consults: Otolaryngology  Significant Diagnostic Studies:  CT soft tissue neck revealed: 5.0 x 6.5 x 8 cm multiloculated presumed abscess within the upper right neck with surrounding inflammatory changes, as detailed. There is extension into the right parapharyngeal space  with associated submucosal edema. Resultant mild/moderate narrowing of the lower nasopharyngeal airway and mild narrowing of the oropharyngeal airway. This may have began as necrotizing adenitis. Close clinical follow-up with repeat imaging as warranted to exclude alternative etiologies (necrotic mass).  Posteromedial displacement and near complete effacement of the upper right internal jugular vein. Medial displacement of the right common and internal carotid arteries. The right ICA has a partially retropharyngeal course.  Right level II/III and bilateral level I lymphadenopathy, likely reactive.  Prominent periapical lucency surrounding the two posterior-most right maxillary teeth. Suspected odontogenic sinusitis within the inferior right maxillary sinus.  I&D specimen culture grew MRSA  Nasal swab was positive for MRSA   Treatments: Incision and drainage of complex right deep neck / parotid abscess.  Discharge Exam: Blood pressure 114/83, pulse 90, temperature 98.9 F (37.2 C), temperature source Oral, resp. rate 18, height 6\' 2"  (1.88 m), weight 96.6 kg, SpO2 100 %.  Disposition: Discharge disposition: 01-Home or Self Care   Discharge Instructions    Ambulatory referral to Nutrition and Diabetic Education   Complete by: As directed    New diagnosis of DM- A1C=11.2%- very interested in learning more   Diet - low sodium heart healthy   Complete by: As directed    Diet Carb Modified   Complete by: As directed    Increase activity slowly   Complete by: As directed      Allergies as of 03/16/2019      Reactions   Bee Venom Swelling      Medication List    STOP taking these medications   amoxicillin 500 MG capsule Commonly known as: AMOXIL   ibuprofen 800 MG tablet  Commonly known as: ADVIL     TAKE these medications   atorvastatin 20 MG tablet Commonly known as: Lipitor Take 1 tablet (20 mg total) by mouth daily.   budesonide-formoterol 80-4.5 MCG/ACT  inhaler Commonly known as: Symbicort 2 PUFFS 2 TIMES A DAY What changed:   how much to take  how to take this  when to take this  reasons to take this  additional instructions   clindamycin 300 MG capsule Commonly known as: CLEOCIN Take 1 capsule (300 mg total) by mouth 4 (four) times daily for 10 days.   glipiZIDE 10 MG 24 hr tablet Commonly known as: GLUCOTROL XL Take 1 tablet (10 mg total) by mouth daily with breakfast. Start taking on: March 17, 2019   metFORMIN 1000 MG tablet Commonly known as: GLUCOPHAGE Take 1 tablet (1,000 mg total) by mouth 2 (two) times daily with a meal.   mupirocin cream 2 % Commonly known as: BACTROBAN Apply topically 2 (two) times daily.   oxyCODONE 5 MG immediate release tablet Commonly known as: Oxy IR/ROXICODONE Take 1 tablet (5 mg total) by mouth every 4 (four) hours as needed for moderate pain.        SignedBonnell Public 03/16/2019, 2:31 PM

## 2019-03-16 NOTE — Progress Notes (Signed)
Pt ambulated off unit with NT  Pt has all belongings

## 2019-03-16 NOTE — Progress Notes (Signed)
Spoke to MD Teoh on the phone regarding pt wound care, MD stated to use 4x4 gauze and tape  Educated pt on how to complete dressing change at home, pt verbalizes understanding  Pt IV removed, catheter intact  Discharge education completed at bedside Pt has all belongings   Awaiting pt transportation

## 2019-03-17 ENCOUNTER — Other Ambulatory Visit: Payer: Self-pay

## 2019-03-17 ENCOUNTER — Telehealth: Payer: Self-pay | Admitting: *Deleted

## 2019-03-17 LAB — AEROBIC/ANAEROBIC CULTURE W GRAM STAIN (SURGICAL/DEEP WOUND): Gram Stain: NONE SEEN

## 2019-03-17 NOTE — Telephone Encounter (Signed)
Pt called in requesting help on how to take meds. Pt spelled the medications and I had him write with a marker the times to take them on the bottle.Pt states complete understanding. He had AWV scheduled for today, but did not answer during that time frame

## 2019-04-08 ENCOUNTER — Other Ambulatory Visit: Payer: Self-pay | Admitting: Family

## 2019-04-08 NOTE — Telephone Encounter (Signed)
What is the name of the medication? gliglipiZIDE (GLUCOTROL XL) 10 MG 24 hr , metFORMIN (GLUCOPHAGE) 1000 MG tablet, AND atorvastatin (LIPITOR) 20 MG tablet  Have you contacted your pharmacy to request a refill? YES/ HE said he didn't have anymore refills  Which pharmacy would you like this sent to? [facility]   Patient notified that their request is being sent to the clinical staff for review and that they should receive a call once it is complete. If they do not receive a call within 24 hours they can check with their pharmacy or our office.

## 2019-04-09 ENCOUNTER — Other Ambulatory Visit: Payer: Self-pay | Admitting: Family

## 2019-04-09 MED ORDER — METFORMIN HCL 1000 MG PO TABS: 1000.0000 mg | ORAL_TABLET | Freq: Two times a day (BID) | ORAL | 3 refills | Status: DC

## 2019-04-09 NOTE — Telephone Encounter (Signed)
What is the name of the medication? metFORMIN (GLUCOPHAGE) 1000 MG tablet  Have you contacted your pharmacy to request a refill? no  Which pharmacy would you like this sent to? [facility]   Patient notified that their request is being sent to the clinical staff for review and that they should receive a call once it is complete. If they do not receive a call within 24 hours they can check with their pharmacy or our office.

## 2019-04-09 NOTE — Telephone Encounter (Signed)
Meds sent

## 2019-04-14 DIAGNOSIS — Z0100 Encounter for examination of eyes and vision without abnormal findings: Secondary | ICD-10-CM | POA: Diagnosis not present

## 2019-04-14 DIAGNOSIS — E119 Type 2 diabetes mellitus without complications: Secondary | ICD-10-CM | POA: Diagnosis not present

## 2019-04-22 ENCOUNTER — Other Ambulatory Visit: Payer: Self-pay | Admitting: *Deleted

## 2019-04-22 MED ORDER — METFORMIN HCL 1000 MG PO TABS: 1000.0000 mg | ORAL_TABLET | Freq: Two times a day (BID) | ORAL | 0 refills | Status: DC

## 2019-05-05 ENCOUNTER — Ambulatory Visit: Payer: Medicare Other | Admitting: Nutrition

## 2019-06-10 ENCOUNTER — Ambulatory Visit: Payer: Medicare Other | Admitting: Nutrition

## 2019-06-16 ENCOUNTER — Other Ambulatory Visit: Payer: Self-pay | Admitting: Family

## 2019-06-16 MED ORDER — BLOOD GLUCOSE METER KIT: PACK | 0 refills | Status: AC

## 2019-06-16 NOTE — Telephone Encounter (Signed)
Printed and will have hawks sign - will then fax to [employer]

## 2019-06-18 ENCOUNTER — Ambulatory Visit (INDEPENDENT_AMBULATORY_CARE_PROVIDER_SITE_OTHER): Payer: Medicare Other | Admitting: *Deleted

## 2019-06-18 DIAGNOSIS — Z0000 Encounter for general adult medical examination without abnormal findings: Secondary | ICD-10-CM

## 2019-06-18 NOTE — Progress Notes (Signed)
MEDICARE ANNUAL WELLNESS VISIT  06/18/2019  Telephone Visit Disclaimer This Medicare AWV was conducted by telephone due to national recommendations for restrictions regarding the COVID-19 Pandemic (e.g. social distancing).  I verified, using two identifiers, that I am speaking with Casey Daniels or their authorized healthcare agent. I discussed the limitations, risks, security, and privacy concerns of performing an evaluation and management service by telephone and the potential availability of an in-person appointment in the future. The patient expressed understanding and agreed to proceed.   Subjective:  Casey Daniels is a 44 y.o. male patient of Hawks, Theador Hawthorne, FNP who had a Medicare Annual Wellness Visit today via telephone. Pellegrino is Disabled and lives with their brother. he has 0 children. he reports that he is socially active and does interact with friends/family regularly. he is markedly physically active and enjoys mowing yards and detailing cars.  Patient Care Team: Sharion Balloon, FNP as PCP - General (Family Medicine)  Advanced Directives 06/18/2019 03/14/2019 03/10/2019 03/13/2018 02/26/2017  Does Patient Have a Medical Advance Directive? No - No No No  Would patient like information on creating a medical advance directive? No - Patient declined No - Patient declined - No - Patient declined -    Hospital Utilization Over the Past 12 Months: # of hospitalizations or ER visits: 1 # of surgeries: 1  Review of Systems    Patient reports that his overall health is better compared to last year.  History obtained from chart review  Patient Reported Readings (BP, Pulse, CBG, Weight, etc) none  Pain Assessment Pain : No/denies pain     Current Medications & Allergies (verified) Allergies as of 06/18/2019      Reactions   Bee Venom Swelling      Medication List       Accurate as of June 18, 2019  9:48 AM. If you have any questions, ask your nurse or  doctor.        STOP taking these medications   mupirocin cream 2 % Commonly known as: BACTROBAN   oxyCODONE 5 MG immediate release tablet Commonly known as: Oxy IR/ROXICODONE     TAKE these medications   atorvastatin 20 MG tablet Commonly known as: Lipitor Take 1 tablet (20 mg total) by mouth daily.   blood glucose meter kit and supplies Check BS BID and PRN (dx E11.9)   budesonide-formoterol 80-4.5 MCG/ACT inhaler Commonly known as: Symbicort 2 PUFFS 2 TIMES A DAY What changed:   how much to take  how to take this  when to take this  reasons to take this  additional instructions   glipiZIDE 10 MG 24 hr tablet Commonly known as: GLUCOTROL XL Take 1 tablet (10 mg total) by mouth daily with breakfast.   metFORMIN 1000 MG tablet Commonly known as: GLUCOPHAGE Take 1 tablet (1,000 mg total) by mouth 2 (two) times daily with a meal.       History (reviewed): Past Medical History:  Diagnosis Date  . Arthritis    bilateral ankles due to club foot release as an infant  . Asthma   . Diabetes mellitus without complication ([place])   . Hyperlipidemia    Past Surgical History:  Procedure Laterality Date  . CLUB FOOT RELEASE Bilateral   . INCISION AND DRAINAGE ABSCESS N/A 03/12/2019   Procedure: INCISION AND DRAINAGE NECK ABSCESS;  Surgeon: Leta Baptist, MD;  Location: [facility];  Service: ENT;  Laterality: N/A;   Family History  Problem Relation  Age of Onset  . Diabetes Mother   . Healthy Brother    Social History   Socioeconomic History  . Marital status: Single    Spouse name: Not on file  . Number of children: 0  . Years of education: 51  . Highest education level: High school graduate  Occupational History  . Occupation: unemployed    Comment: disabled  Tobacco Use  . Smoking status: Current Every Day Smoker    Packs/day: 0.25    Years: 15.00    Pack years: 3.75    Types: Cigarettes  . Smokeless tobacco: Never Used  . Tobacco comment: quits off and on    Substance and Sexual Activity  . Alcohol use: Yes    Alcohol/week: 2.0 standard drinks    Types: 2 Cans of beer per week  . Drug use: Yes    Types: Marijuana  . Sexual activity: Not Currently    Birth control/protection: None  Other Topics Concern  . Not on file  Social History Narrative  . Not on file   Social Determinants of Health   Financial Resource Strain: Low Risk   . Difficulty of Paying Living Expenses: Not hard at all  Food Insecurity: No Food Insecurity  . Worried About Charity fundraiser in the Last Year: Never true  . Ran Out of Food in the Last Year: Never true  Transportation Needs: No Transportation Needs  . Lack of Transportation (Medical): No  . Lack of Transportation (Non-Medical): No  Physical Activity: Sufficiently Active  . Days of Exercise per Week: 7 days  . Minutes of Exercise per Session: 30 min  Stress: No Stress Concern Present  . Feeling of Stress : Only a little  Social Connections: Somewhat Isolated  . Frequency of Communication with Friends and Family: More than three times a week  . Frequency of Social Gatherings with Friends and Family: More than three times a week  . Attends Religious Services: More than 4 times per year  . Active Member of Clubs or Organizations: No  . Attends Archivist Meetings: Never  . Marital Status: Never married    Activities of Daily Living In your present state of health, do you have any difficulty performing the following activities: 06/18/2019 03/14/2019  Hearing? N N  Vision? N N  Comment due for diabetic eye exam-will schedule -  Difficulty concentrating or making decisions? N N  Walking or climbing stairs? Y N  Dressing or bathing? N N  Doing errands, shopping? N N  Preparing Food and eating ? N -  Using the Toilet? N -  In the past six months, have you accidently leaked urine? N -  Do you have problems with loss of bowel control? N -  Managing your Medications? Y -  Comment he sometimes  gets confused on how to take them-but he calls [place] when he isn't sure -  Managing your Finances? N -  Housekeeping or managing your Housekeeping? N -  Some recent data might be hidden    Patient Education/ Literacy How often do you need to have someone help you when you read instructions, pamphlets, or other written materials from your doctor or pharmacy?: 2 - Rarely What is the last grade level you completed in school?: 12th grade  Exercise Current Exercise Habits: Home exercise routine, Type of exercise: walking, Time (Minutes): 30, Frequency (Times/Week): 7, Weekly Exercise (Minutes/Week): 210, Exercise limited by: respiratory conditions(s);orthopedic condition(s)  Diet Patient reports consuming 3 meals  a day and 1 snack(s) a day Patient reports that his primary diet is: Regular Patient reports that she does have regular access to food.   Depression Screen PHQ 2/9 Scores 06/18/2019 03/10/2019 01/07/2019 12/12/2018 03/13/2018 02/19/2018 09/24/2017  PHQ - 2 Score 0 0 0 0 0 0 3  PHQ- 9 Score - - - - - - 8     Fall Risk Fall Risk  06/18/2019 03/10/2019 01/07/2019 12/12/2018 09/24/2017  Falls in the past year? 0 0 1 1 No  Number falls in past yr: - - 0 0 -  Injury with Fall? - - 1 1 -     Objective:  Casey Daniels seemed alert and oriented and he participated appropriately during our telephone visit.  Blood Pressure Weight BMI  BP Readings from Last 3 Encounters:  03/16/19 114/83  03/10/19 134/87  01/07/19 120/82   Wt Readings from Last 3 Encounters:  03/10/19 213 lb (96.6 kg)  03/10/19 213 lb (96.6 kg)  01/07/19 235 lb (106.6 kg)   BMI Readings from Last 1 Encounters:  03/10/19 27.35 kg/m    *Unable to obtain current vital signs, weight, and BMI due to telephone visit type  Hearing/Vision  . Damarie did not seem to have difficulty with hearing/understanding during the telephone conversation . Reports that he has not had a formal eye exam by an eye care professional within the  past year . Reports that he has not had a formal hearing evaluation within the past year *Unable to fully assess hearing and vision during telephone visit type  Cognitive Function: 6CIT Screen 06/18/2019  What Year? 0 points  What month? 0 points  What time? 0 points  Count back from 20 0 points  Months in reverse 0 points  Repeat phrase 0 points  Total Score 0   (Normal:0-7, Significant for Dysfunction: >8)  Normal Cognitive Function Screening: Yes   Immunization & Health Maintenance Record Immunization History  Administered Date(s) Administered  . Influenza,inj,Quad PF,6+ Mos 01/29/2017, 02/19/2018, 01/07/2019  . Influenza,trivalent, recombinat, inj, PF 04/17/2015  . Tdap 02/19/2015    Health Maintenance  Topic Date Due  . PNEUMOCOCCAL POLYSACCHARIDE VACCINE AGE 39-64 HIGH RISK  Never done  . FOOT EXAM  Never done  . OPHTHALMOLOGY EXAM  Never done  . URINE MICROALBUMIN  Never done  . HEMOGLOBIN A1C  09/10/2019  . TETANUS/TDAP  02/18/2025  . INFLUENZA VACCINE  Completed  . HIV Screening  Completed       Assessment  This is a routine wellness examination for Casey Daniels.  Health Maintenance: Due or Overdue Health Maintenance Due  Topic Date Due  . PNEUMOCOCCAL POLYSACCHARIDE VACCINE AGE 39-64 HIGH RISK  Never done  . FOOT EXAM  Never done  . OPHTHALMOLOGY EXAM  Never done  . URINE MICROALBUMIN  Never done    Casey Daniels does not need a referral for Community Assistance: Care Management:   no Social Work:    no Prescription Assistance:  no Nutrition/Diabetes Education:  no   Plan:  Personalized Goals Goals Addressed            This Visit's Progress   . Quit Smoking        Personalized Health Maintenance & Screening Recommendations  Diabetic Eye Exam  Lung Cancer Screening Recommended: no (Low Dose CT Chest recommended if Age 51-80 years, 30 pack-year currently smoking OR have quit w/in past 15 years) Hepatitis C Screening recommended:  no HIV Screening recommended: no  Advanced  Directives: Written information was not prepared per patient's request.  Referrals & Orders No orders of the defined types were placed in this encounter.   Follow-up Plan . Follow-up with Sharion Balloon, FNP as planned . Schedule your Diabetic Eye Exam   I have personally reviewed and noted the following in the patient's chart:   . Medical and social history . Use of alcohol, tobacco or illicit drugs  . Current medications and supplements . Functional ability and status . Nutritional status . Physical activity . Advanced directives . List of other physicians . Hospitalizations, surgeries, and ER visits in previous 12 months . Vitals . Screenings to include cognitive, depression, and falls . Referrals and appointments  In addition, I have reviewed and discussed with Casey Daniels certain preventive protocols, quality metrics, and best practice recommendations. A written personalized care plan for preventive services as well as general preventive health recommendations is available and can be mailed to the patient at his request.      Milas Hock, LPN  2/44/0102

## 2019-06-18 NOTE — Patient Instructions (Signed)
Preventive Care 45-45 Years Old, Male Preventive care refers to lifestyle choices and visits with your health care provider that can promote health and wellness. This includes:  A yearly physical exam. This is also called an annual well check.  Regular dental and eye exams.  Immunizations.  Screening for certain conditions.  Healthy lifestyle choices, such as eating a healthy diet, getting regular exercise, not using drugs or products that contain nicotine and tobacco, and limiting alcohol use. What can I expect for my preventive care visit? Physical exam Your health care provider will check:  Height and weight. These may be used to calculate body mass index (BMI), which is a measurement that tells if you are at a healthy weight.  Heart rate and blood pressure.  Your skin for abnormal spots. Counseling Your health care provider may ask you questions about:  Alcohol, tobacco, and drug use.  Emotional well-being.  Home and relationship well-being.  Sexual activity.  Eating habits.  Work and work Statistician. What immunizations do I need?  Influenza (flu) vaccine  This is recommended every year. Tetanus, diphtheria, and pertussis (Tdap) vaccine  You may need a Td booster every 10 years. Varicella (chickenpox) vaccine  You may need this vaccine if you have not already been vaccinated. Zoster (shingles) vaccine  You may need this after age 45. Measles, mumps, and rubella (MMR) vaccine  You may need at least one dose of MMR if you were born in 1957 or later. You may also need a second dose. Pneumococcal conjugate (PCV13) vaccine  You may need this if you have certain conditions and were not previously vaccinated. Pneumococcal polysaccharide (PPSV23) vaccine  You may need one or two doses if you smoke cigarettes or if you have certain conditions. Meningococcal conjugate (MenACWY) vaccine  You may need this if you have certain conditions. Hepatitis A  vaccine  You may need this if you have certain conditions or if you travel or work in places where you may be exposed to hepatitis A. Hepatitis B vaccine  You may need this if you have certain conditions or if you travel or work in places where you may be exposed to hepatitis B. Haemophilus influenzae type b (Hib) vaccine  You may need this if you have certain risk factors. Human papillomavirus (HPV) vaccine  If recommended by your health care provider, you may need three doses over 6 months. You may receive vaccines as individual doses or as more than one vaccine together in one shot (combination vaccines). Talk with your health care provider about the risks and benefits of combination vaccines. What tests do I need? Blood tests  Lipid and cholesterol levels. These may be checked every 5 years, or more frequently if you are over 60 years old.  Hepatitis C test.  Hepatitis B test. Screening  Lung cancer screening. You may have this screening every year starting at age 45 if you have a 30-pack-year history of smoking and currently smoke or have quit within the past 15 years.  Prostate cancer screening. Recommendations will vary depending on your family history and other risks.  Colorectal cancer screening. All adults should have this screening starting at age 45 and continuing until age 2. Your health care provider may recommend screening at age 45 if you are at increased risk. You will have tests every 1-10 years, depending on your results and the type of screening test.  Diabetes screening. This is done by checking your blood sugar (glucose) after you have not eaten  for a while (fasting). You may have this done every 1-3 years.  Sexually transmitted disease (STD) testing. Follow these instructions at home: Eating and drinking  Eat a diet that includes fresh fruits and vegetables, whole grains, lean protein, and low-fat dairy products.  Take vitamin and mineral supplements as  recommended by your health care provider.  Do not drink alcohol if your health care provider tells you not to drink.  If you drink alcohol: ? Limit how much you have to 0-2 drinks a day. ? Be aware of how much alcohol is in your drink. In the [place] one drink equals one 12 oz bottle of beer (355 mL), one 5 oz glass of wine (148 mL), or one 1 oz glass of hard liquor (44 mL). Lifestyle  Take daily care of your teeth and gums.  Stay active. Exercise for at least 30 minutes on 5 or more days each week.  Do not use any products that contain nicotine or tobacco, such as cigarettes, e-cigarettes, and chewing tobacco. If you need help quitting, ask your health care provider.  If you are sexually active, practice safe sex. Use a condom or other form of protection to prevent STIs (sexually transmitted infections).  Talk with your health care provider about taking a low-dose aspirin every day starting at age 45. What's next?  Go to your health care provider once a year for a well check visit.  Ask your health care provider how often you should have your eyes and teeth checked.  Stay up to date on all vaccines. This information is not intended to replace advice given to you by your health care provider. Make sure you discuss any questions you have with your health care provider. Document Revised: 03/14/2018 Document Reviewed: 03/14/2018 Elsevier Patient Education  2020 [employer].

## 2019-06-20 ENCOUNTER — Ambulatory Visit (INDEPENDENT_AMBULATORY_CARE_PROVIDER_SITE_OTHER): Payer: Medicare Other | Admitting: Family

## 2019-06-20 ENCOUNTER — Other Ambulatory Visit: Payer: Self-pay

## 2019-06-20 ENCOUNTER — Encounter: Payer: Self-pay | Admitting: Family

## 2019-06-20 DIAGNOSIS — E1165 Type 2 diabetes mellitus with hyperglycemia: Secondary | ICD-10-CM | POA: Diagnosis not present

## 2019-06-20 DIAGNOSIS — E663 Overweight: Secondary | ICD-10-CM | POA: Diagnosis not present

## 2019-06-20 DIAGNOSIS — F17200 Nicotine dependence, unspecified, uncomplicated: Secondary | ICD-10-CM

## 2019-06-20 DIAGNOSIS — J4541 Moderate persistent asthma with (acute) exacerbation: Secondary | ICD-10-CM

## 2019-06-20 NOTE — Progress Notes (Signed)
Virtual Visit via telephone Note Due to COVID-19 pandemic this visit was conducted virtually. This visit type was conducted due to national recommendations for restrictions regarding the COVID-19 Pandemic (e.g. social distancing, sheltering in place) in an effort to limit this patient's exposure and mitigate transmission in our community. All issues noted in this document were discussed and addressed.  A physical exam was not performed with this format.  I connected with Casey Daniels on 06/20/19 at 11:40 AM by telephone and verified that I am speaking with the correct person using two identifiers. [name] is currently located at home and no one is currently with him during visit. The provider, Evelina Dun, FNP is located in their office at time of visit.  I discussed the limitations, risks, security and privacy concerns of performing an evaluation and management service by telephone and the availability of in person appointments. I also discussed with the patient that there may be a patient responsible charge related to this service. The patient expressed understanding and agreed to proceed.   History and Present Illness:  Diabetes He presents for his follow-up diabetic visit. He has type 2 diabetes mellitus. His disease course has been stable. There are no hypoglycemic associated symptoms. There are no diabetic associated symptoms. Pertinent negatives for diabetes include no blurred vision and no foot paresthesias. Symptoms are stable. Pertinent negatives for diabetic complications include no CVA or heart disease. Risk factors for coronary artery disease include dyslipidemia, diabetes mellitus, male sex and sedentary lifestyle. He is following a generally unhealthy diet. His overall blood glucose range is 110-130 mg/dl.  Asthma There is no cough or wheezing. This is a chronic problem. The current episode started more than 1 year ago. He reports moderate improvement on treatment. His  symptoms are not alleviated by rest. Risk factors for lung disease include smoking/tobacco exposure. His past medical history is significant for asthma.  Nicotine Dependence Presents for follow-up visit. His urge triggers include company of smokers. He smokes < 1/2 a pack of cigarettes per day.      Review of Systems  Eyes: Negative for blurred vision.  Respiratory: Negative for cough and wheezing.   All other systems reviewed and are negative.    Observations/Objective: No SOB or distress noted  Assessment and Plan: 1. Moderate persistent asthma with acute exacerbation - CMP14+EGFR; Future - CBC with Differential/Platelet; Future  2. Type 2 diabetes mellitus with hyperglycemia, without long-term current use of insulin (HCC) - CMP14+EGFR; Future - CBC with Differential/Platelet; Future - Bayer DCA Hb A1c Waived; Future - Microalbumin / creatinine urine ratio; Future  3. Current smoker Smoking cessation discussed - CMP14+EGFR; Future - CBC with Differential/Platelet; Future  4. Overweight (BMI 25.0-29.9) - CMP14+EGFR; Future - CBC with Differential/Platelet; Future  Pt will come in to have lab work complete Continue medications Low carb diet   I discussed the assessment and treatment plan with the patient. The patient was provided an opportunity to ask questions and all were answered. The patient agreed with the plan and demonstrated an understanding of the instructions.   The patient was advised to call back or seek an in-person evaluation if the symptoms worsen or if the condition fails to improve as anticipated.  The above assessment and management plan was discussed with the patient. The patient verbalized understanding of and has agreed to the management plan. Patient is aware to call the clinic if symptoms persist or worsen. Patient is aware when to return to the clinic for  a follow-up visit. Patient educated on when it is appropriate to go to the emergency  department.   Time call ended:  11:59 AM   I provided 19 minutes of non-face-to-face time during this encounter.    Evelina Dun, FNP

## 2019-07-01 ENCOUNTER — Telehealth: Payer: Self-pay | Admitting: Family

## 2019-07-03 ENCOUNTER — Telehealth: Payer: Self-pay | Admitting: Family

## 2019-07-03 ENCOUNTER — Ambulatory Visit: Payer: Medicare Other | Admitting: Nutrition

## 2019-07-03 NOTE — Chronic Care Management (AMB) (Signed)
  Chronic Care Management   Note  07/03/2019 Name: Casey Daniels MRN: [id number] DOB: 02/24/1976  Casey Daniels is a 44 y.o. year old male who is a primary care patient of Sharion Balloon, FNP. I reached out to Casey Daniels by phone today in response to a referral sent by Mr. Aundria Rud Siharath's health plan.     Mr. Tippin was given information about Chronic Care Management services today including:  1. CCM service includes personalized support from designated clinical staff supervised by his physician, including individualized plan of care and coordination with other care providers 2. 24/7 contact phone numbers for assistance for urgent and routine care needs. 3. Service will only be billed when office clinical staff spend 20 minutes or more in a month to coordinate care. 4. Only one practitioner may furnish and bill the service in a calendar month. 5. The patient may stop CCM services at any time (effective at the end of the month) by phone call to the office staff. 6. The patient will be responsible for cost sharing (co-pay) of up to 20% of the service fee (after annual deductible is met).  Patient agreed to services and verbal consent obtained.   Follow up plan: Telephone appointment with care management team member scheduled for:03/04/2020.  [place], [place] [address] Direct Dial: [phone] [email or web address]

## 2019-07-10 ENCOUNTER — Ambulatory Visit: Payer: Medicare Other | Attending: Internal Medicine

## 2019-07-10 DIAGNOSIS — Z23 Encounter for immunization: Secondary | ICD-10-CM

## 2019-07-10 NOTE — Progress Notes (Signed)
   Covid-19 Vaccination Clinic  Name:  Casey Daniels    MRN: [id number] DOB: 03/16/1976  07/10/2019  [name] was observed post Covid-19 immunization for 15 minutes without incident. He was provided with Vaccine Information Sheet and instruction to access the V-Safe system.   [name] was instructed to call 911 with any severe reactions post vaccine: Marland Kitchen Difficulty breathing  . Swelling of face and throat  . A fast heartbeat  . A bad rash all over body  . Dizziness and weakness   Immunizations Administered    Name Date Dose VIS Date Route   Moderna COVID-19 Vaccine 07/10/2019 11:04 AM 0.5 mL 03/04/2019 Intramuscular   Manufacturer: Moderna   Lot: [id number]   Fernando SalinasBE:[id number]

## 2019-07-11 ENCOUNTER — Telehealth: Payer: Self-pay | Admitting: Family

## 2019-07-11 DIAGNOSIS — E1165 Type 2 diabetes mellitus with hyperglycemia: Secondary | ICD-10-CM

## 2019-07-11 MED ORDER — GLUCOSE METER TEST VI STRP: ORAL_STRIP | 12 refills | Status: AC

## 2019-07-11 NOTE — Telephone Encounter (Signed)
Pt called stating that he was sent the wrong strips for his blood glucose machine. Pt says he has a new machine and needs Prodigy Strips for it.

## 2019-07-11 NOTE — Telephone Encounter (Signed)
Prescription sent to pharmacy.

## 2019-07-31 ENCOUNTER — Other Ambulatory Visit: Payer: Medicare Other

## 2019-07-31 DIAGNOSIS — J4541 Moderate persistent asthma with (acute) exacerbation: Secondary | ICD-10-CM | POA: Diagnosis not present

## 2019-07-31 DIAGNOSIS — E1165 Type 2 diabetes mellitus with hyperglycemia: Secondary | ICD-10-CM | POA: Diagnosis not present

## 2019-07-31 DIAGNOSIS — F17200 Nicotine dependence, unspecified, uncomplicated: Secondary | ICD-10-CM

## 2019-07-31 DIAGNOSIS — E663 Overweight: Secondary | ICD-10-CM

## 2019-07-31 LAB — BAYER DCA HB A1C WAIVED: HB A1C (BAYER DCA - WAIVED): 5.9 % (ref <7.0)

## 2019-08-01 LAB — CMP14+EGFR
ALT: 21 IU/L (ref 0–44)
AST: 30 IU/L (ref 0–40)
Albumin/Globulin Ratio: 2.1 (ref 1.2–2.2)
Albumin: 5.1 g/dL — ABNORMAL HIGH (ref 4.0–5.0)
Alkaline Phosphatase: 69 IU/L (ref 39–117)
BUN/Creatinine Ratio: 16 (ref 9–20)
BUN: 21 mg/dL (ref 6–24)
Bilirubin Total: 0.3 mg/dL (ref 0.0–1.2)
CO2: 20 mmol/L (ref 20–29)
Calcium: 10 mg/dL (ref 8.7–10.2)
Chloride: 103 mmol/L (ref 96–106)
Creatinine, Ser: 1.29 mg/dL — ABNORMAL HIGH (ref 0.76–1.27)
GFR calc Af Amer: 77 mL/min/{1.73_m2} (ref >59)
GFR calc non Af Amer: 67 mL/min/{1.73_m2} (ref >59)
Globulin, Total: 2.4 g/dL (ref 1.5–4.5)
Glucose: 128 mg/dL — ABNORMAL HIGH (ref 65–99)
Potassium: 4.1 mmol/L (ref 3.5–5.2)
Sodium: 141 mmol/L (ref 134–144)
Total Protein: 7.5 g/dL (ref 6.0–8.5)

## 2019-08-01 LAB — CBC WITH DIFFERENTIAL/PLATELET
Basophils Absolute: 0 10*3/uL (ref 0.0–0.2)
Basos: 0 %
EOS (ABSOLUTE): 0.1 10*3/uL (ref 0.0–0.4)
Eos: 3 %
Hematocrit: 46.1 % (ref 37.5–51.0)
Hemoglobin: 15.4 g/dL (ref 13.0–17.7)
Immature Grans (Abs): 0 10*3/uL (ref 0.0–0.1)
Immature Granulocytes: 0 %
Lymphocytes Absolute: 1.9 10*3/uL (ref 0.7–3.1)
Lymphs: 38 %
MCH: 29.8 pg (ref 26.6–33.0)
MCHC: 33.4 g/dL (ref 31.5–35.7)
MCV: 89 fL (ref 79–97)
Monocytes Absolute: 0.4 10*3/uL (ref 0.1–0.9)
Monocytes: 7 %
Neutrophils Absolute: 2.7 10*3/uL (ref 1.4–7.0)
Neutrophils: 52 %
Platelets: 289 10*3/uL (ref 150–450)
RBC: 5.17 x10E6/uL (ref 4.14–5.80)
RDW: 13.3 % (ref 11.6–15.4)
WBC: 5.1 10*3/uL (ref 3.4–10.8)

## 2019-08-01 LAB — MICROALBUMIN / CREATININE URINE RATIO
Creatinine, Urine: 305.2 mg/dL
Microalb/Creat Ratio: 6 mg/g creat (ref 0–29)
Microalbumin, Urine: 17.1 ug/mL

## 2019-08-05 ENCOUNTER — Other Ambulatory Visit: Payer: Self-pay | Admitting: Family

## 2019-08-07 ENCOUNTER — Ambulatory Visit: Payer: Medicare Other | Attending: Internal Medicine

## 2019-08-07 DIAGNOSIS — Z23 Encounter for immunization: Secondary | ICD-10-CM

## 2019-08-07 NOTE — Progress Notes (Signed)
   Covid-19 Vaccination Clinic  Name:  praise elsmore    MRN: [id number] DOB: 1975/07/09  08/07/2019  Mr. Beserra was observed post Covid-19 immunization for 15 minutes without incident. He was provided with Vaccine Information Sheet and instruction to access the V-Safe system.   Mr. Ivery was instructed to call 911 with any severe reactions post vaccine: Marland Kitchen Difficulty breathing  . Swelling of face and throat  . A fast heartbeat  . A bad rash all over body  . Dizziness and weakness   Immunizations Administered    Name Date Dose VIS Date Route   Moderna COVID-19 Vaccine 08/07/2019 10:15 AM 0.5 mL 03/2019 Intramuscular   Manufacturer: Moderna   Lot: [id number]   [place]:[id number]

## 2019-08-20 ENCOUNTER — Other Ambulatory Visit: Payer: Self-pay

## 2019-08-20 ENCOUNTER — Encounter: Payer: Self-pay | Admitting: Nutrition

## 2019-08-20 ENCOUNTER — Encounter: Payer: Medicare Other | Attending: Family | Admitting: Nutrition

## 2019-08-20 VITALS — Ht 75.0 in | Wt 208.0 lb

## 2019-08-20 DIAGNOSIS — E1100 Type 2 diabetes mellitus with hyperosmolarity without nonketotic hyperglycemic-hyperosmolar coma (NKHHC): Secondary | ICD-10-CM | POA: Diagnosis not present

## 2019-08-20 NOTE — Progress Notes (Signed)
  Medical Nutrition Therapy:  Appt start time: 1430 end time:  [id number].  Assessment:  Primary concerns today: Diabetes Type 2.  Was In hospital and found out he had DM Type 2. BS was > 300's when he was in hospital. He had surgery on his neck from an abscess possibly and had an infection in it. Lives with his brother.. He both cook and shop. Eats 2-3 times per week. Restaurants. Not eating out as much. Metformin 1000 mg BID.Isn't taking Glipizide. Has cut out eating junk food. Drinking water. Limited alcohol to 1 beer per day. Sometimes drinks hard liquor.  Smokes only a few cigarettes a day now.Working on quitting. Walks a lot and rides his bike. Doesn't have a car.  BS are doing very well. 78-130 mg/dl  before breakast and bedtime. BS have been over 200's a few times in the past when lab work was done. Admits to eating things he shouldn't at times. Trying to do better. Motivated to make changes.  CMP Latest Ref Rng & Units 07/31/2019 03/15/2019 03/13/2019  Glucose 65 - 99 mg/dL 128(H) 151(H) 272(H)  BUN 6 - 24 mg/dL 21 15 11   Creatinine 0.76 - 1.27 mg/dL 1.29(H) 1.10 1.08  Sodium 134 - 144 mmol/L 141 136 135  Potassium 3.5 - 5.2 mmol/L 4.1 3.9 4.1  Chloride 96 - 106 mmol/L 103 102 99  CO2 20 - 29 mmol/L 20 23 25   Calcium 8.7 - 10.2 mg/dL 10.0 9.3 9.1  Total Protein 6.0 - 8.5 g/dL 7.5 - 7.6  Total Bilirubin 0.0 - 1.2 mg/dL 0.3 - 0.5  Alkaline Phos 39 - 117 IU/L 69 - 62  AST 0 - 40 IU/L 30 - 23  ALT 0 - 44 IU/L 21 - 36   Lab Results  Component Value Date   HGBA1C 5.9 07/31/2019    Preferred Learning Style:   No preference indicated   Learning Readiness:   Ready  Change in progress  MEDICATIONS:    DIETARY INTAKE:   24-hr recall:   Eats 3 meals per day.   Usual physical activity: walks and rides his bike   Estimated energy needs: 2000-2200  calories 248 g carbohydrates 106 g protein 61 g fat  Progress Towards Goal(s):  In progress.   Nutritional Diagnosis:   NB-1.1 Food and nutrition-related knowledge deficit As related to Diabetes .  As evidenced by BS > 300 mg/dl and A1C 5.9%.    Intervention:  Nutrition and Diabetes education provided on My Plate, CHO counting, meal planning, portion sizes, timing of meals, avoiding snacks between meals unless having a low blood sugar, target ranges for A1C and blood sugars, signs/symptoms and treatment of hyper/hypoglycemia, monitoring blood sugars, taking medications as prescribed, benefits of exercising 30 minutes per day and prevention of complications of DM.  Goals  Follow My Plate Eat meals on time. Eat 4-5 carb choices per meal Continue to increase fresh fruits and vegetables. Drink only water. No diet sodas or crystal light. Walk daily. Keep BS less than 150 mg/dl at night.   Teaching Method Utilized:  Visual Auditory Hands on  Handouts given during visit include:  The Plate Method    Meal Plan Card  Diabetes Instructions.   Barriers to learning/adherence to lifestyle change:  none  Demonstrated degree of understanding via:  Teach Back   Monitoring/Evaluation:  Dietary intake, exercise, , and body weight in 1 month(s).

## 2019-08-20 NOTE — Patient Instructions (Addendum)
Goals  Follow My Plate Eat meals on time. Eat 4-5 carb choices per meal Continue to increase fresh fruits and vegetables. Drink only water. No diet sodas or crystal light. Walk daily. Keep BS less than 150 mg/dl at night.

## 2019-08-25 ENCOUNTER — Other Ambulatory Visit: Payer: Self-pay | Admitting: Family

## 2019-08-25 ENCOUNTER — Telehealth: Payer: Self-pay | Admitting: Family

## 2019-08-25 DIAGNOSIS — J4540 Moderate persistent asthma, uncomplicated: Secondary | ICD-10-CM

## 2019-08-25 MED ORDER — BUDESONIDE-FORMOTEROL FUMARATE 80-4.5 MCG/ACT IN AERO: INHALATION_SPRAY | RESPIRATORY_TRACT | 1 refills | Status: DC

## 2019-08-25 NOTE — Telephone Encounter (Signed)
Aware.  We do not have samples of Symbicort now.

## 2019-08-25 NOTE — Telephone Encounter (Signed)
Pt aware we will not be getting anymore samples since it went generic. Refill sent to [facility]

## 2019-08-25 NOTE — Telephone Encounter (Signed)
  Prescription Request  08/25/2019  What is the name of the medication or equipment? Symbicort 80-4.5 MCG Patient is out and needs samples if we have.  Have you contacted your pharmacy to request a refill? (if applicable) Yes  Which pharmacy would you like this sent to? [facility]   Patient notified that their request is being sent to the clinical staff for review and that they should receive a response within 2 business days.

## 2019-08-28 ENCOUNTER — Encounter: Payer: Self-pay | Admitting: Nutrition

## 2019-09-16 ENCOUNTER — Encounter: Payer: Self-pay | Admitting: Family

## 2019-09-16 ENCOUNTER — Ambulatory Visit (INDEPENDENT_AMBULATORY_CARE_PROVIDER_SITE_OTHER): Payer: Medicare Other | Admitting: Family

## 2019-09-16 ENCOUNTER — Other Ambulatory Visit: Payer: Self-pay

## 2019-09-16 VITALS — BP 126/82 | HR 88 | Temp 97.1°F | Ht 75.0 in | Wt 208.4 lb

## 2019-09-16 DIAGNOSIS — E1100 Type 2 diabetes mellitus with hyperosmolarity without nonketotic hyperglycemic-hyperosmolar coma (NKHHC): Secondary | ICD-10-CM

## 2019-09-16 DIAGNOSIS — E1169 Type 2 diabetes mellitus with other specified complication: Secondary | ICD-10-CM | POA: Insufficient documentation

## 2019-09-16 DIAGNOSIS — Z23 Encounter for immunization: Secondary | ICD-10-CM

## 2019-09-16 DIAGNOSIS — E663 Overweight: Secondary | ICD-10-CM | POA: Diagnosis not present

## 2019-09-16 DIAGNOSIS — J4541 Moderate persistent asthma with (acute) exacerbation: Secondary | ICD-10-CM

## 2019-09-16 DIAGNOSIS — F17200 Nicotine dependence, unspecified, uncomplicated: Secondary | ICD-10-CM | POA: Diagnosis not present

## 2019-09-16 DIAGNOSIS — E785 Hyperlipidemia, unspecified: Secondary | ICD-10-CM

## 2019-09-16 DIAGNOSIS — E559 Vitamin D deficiency, unspecified: Secondary | ICD-10-CM | POA: Diagnosis not present

## 2019-09-16 NOTE — Addendum Note (Signed)
Addended by: Brynda Peon F on: 09/16/2019 10:40 AM   Modules accepted: Orders

## 2019-09-16 NOTE — Patient Instructions (Signed)

## 2019-09-16 NOTE — Progress Notes (Signed)
Subjective:    Patient ID: Casey Daniels, male    DOB: 1975-07-03, 44 y.o.   MRN: [id number]  Chief Complaint  Patient presents with   Diabetes   Pt presents to the office today for chronic follow up.  Diabetes He presents for his follow-up diabetic visit. He has type 2 diabetes mellitus. His disease course has been stable. There are no hypoglycemic associated symptoms. Pertinent negatives for diabetes include no blurred vision and no foot paresthesias. There are no hypoglycemic complications. Symptoms are stable. Pertinent negatives for diabetic complications include no CVA, heart disease or peripheral neuropathy. Risk factors for coronary artery disease include dyslipidemia, diabetes mellitus, male sex, hypertension and sedentary lifestyle. He is following a generally unhealthy diet. His overall blood glucose range is 110-130 mg/dl. An ACE inhibitor/angiotensin II receptor blocker is being taken. Eye exam is current.  Asthma He complains of hoarse voice. There is no cough or wheezing. This is a chronic problem. The current episode started more than 1 year ago. The problem occurs intermittently. The problem has been waxing and waning. He reports moderate improvement on treatment. His past medical history is significant for asthma.  Nicotine Dependence Presents for follow-up visit. His urge triggers include company of smokers. He smokes < 1/2 a pack of cigarettes per day. Compliance with prior treatments has been good.      Review of Systems  HENT: Positive for hoarse voice.   Eyes: Negative for blurred vision.  Respiratory: Negative for cough and wheezing.   All other systems reviewed and are negative.      Objective:   Physical Exam Vitals reviewed.  Constitutional:      General: He is not in acute distress.    Appearance: He is well-developed.  HENT:     Head: Normocephalic.     Right Ear: Tympanic membrane normal.     Left Ear: Tympanic membrane normal.  Eyes:      General:        Right eye: No discharge.        Left eye: No discharge.     Pupils: Pupils are equal, round, and reactive to light.  Neck:     Thyroid: No thyromegaly.  Cardiovascular:     Rate and Rhythm: Normal rate and regular rhythm.     Heart sounds: Normal heart sounds. No murmur heard.   Pulmonary:     Effort: Pulmonary effort is normal. No respiratory distress.     Breath sounds: Normal breath sounds. No wheezing.  Abdominal:     General: Bowel sounds are normal. There is no distension.     Palpations: Abdomen is soft.     Tenderness: There is no abdominal tenderness.  Musculoskeletal:        General: No tenderness. Normal range of motion.     Cervical back: Normal range of motion and neck supple.  Skin:    General: Skin is warm and dry.     Findings: No erythema or rash.  Neurological:     Mental Status: He is alert and oriented to person, place, and time.     Cranial Nerves: No cranial nerve deficit.     Deep Tendon Reflexes: Reflexes are normal and symmetric.  Psychiatric:        Behavior: Behavior normal.        Thought Content: Thought content normal.        Judgment: Judgment normal.     Diabetic Foot Exam - Simple   Simple Foot  Form Diabetic Foot exam was performed with the following findings: Yes 09/16/2019 10:18 AM  Visual Inspection No deformities, no ulcerations, no other skin breakdown bilaterally: Yes Sensation Testing Intact to touch and monofilament testing bilaterally: Yes Pulse Check Posterior Tibialis and Dorsalis pulse intact bilaterally: Yes Comments      BP 126/82    Pulse 88    Temp (!) 97.1 F (36.2 C) (Temporal)    Ht _0  (1.905 m)    Wt 208 lb 6.4 oz (94.5 kg)    SpO2 96%    BMI 26.05 kg/m      Assessment & Plan:  Casey Daniels comes in today with chief complaint of Diabetes   Diagnosis and orders addressed:  1. Moderate persistent asthma with acute exacerbation - BMP8+EGFR  2. Type 2 diabetes mellitus with  hyperosmolarity without coma, without long-term current use of insulin ([place]) - BMP8+EGFR - Ambulatory referral to Podiatry  3. Current smoker - BMP8+EGFR  4. Overweight (BMI 25.0-29.9) - BMP8+EGFR  5. Vitamin D deficiency - BMP8+EGFR  6. Hyperlipidemia associated with type 2 diabetes mellitus ([place]) - BMP8+EGFR - Lipid panel   Labs pending Health Maintenance reviewed Diet and exercise encouraged  Follow up plan: 3 months   Casey Dun, FNP

## 2019-09-17 LAB — LIPID PANEL
Chol/HDL Ratio: 2.8 ratio (ref 0.0–5.0)
Cholesterol, Total: 175 mg/dL (ref 100–199)
HDL: 62 mg/dL (ref >39)
LDL Chol Calc (NIH): 86 mg/dL (ref 0–99)
Triglycerides: 159 mg/dL — ABNORMAL HIGH (ref 0–149)
VLDL Cholesterol Cal: 27 mg/dL (ref 5–40)

## 2019-09-17 LAB — BMP8+EGFR
BUN/Creatinine Ratio: 14 (ref 9–20)
BUN: 15 mg/dL (ref 6–24)
CO2: 21 mmol/L (ref 20–29)
Calcium: 9.9 mg/dL (ref 8.7–10.2)
Chloride: 104 mmol/L (ref 96–106)
Creatinine, Ser: 1.07 mg/dL (ref 0.76–1.27)
GFR calc Af Amer: 96 mL/min/{1.73_m2} (ref >59)
GFR calc non Af Amer: 83 mL/min/{1.73_m2} (ref >59)
Glucose: 92 mg/dL (ref 65–99)
Potassium: 4.5 mmol/L (ref 3.5–5.2)
Sodium: 143 mmol/L (ref 134–144)

## 2019-09-18 ENCOUNTER — Other Ambulatory Visit: Payer: Self-pay | Admitting: Family

## 2019-09-18 MED ORDER — ATORVASTATIN CALCIUM 20 MG PO TABS: 20.0000 mg | ORAL_TABLET | Freq: Every day | ORAL | 1 refills | Status: DC

## 2019-10-17 ENCOUNTER — Other Ambulatory Visit: Payer: Self-pay | Admitting: Family Medicine

## 2019-10-17 DIAGNOSIS — J4540 Moderate persistent asthma, uncomplicated: Secondary | ICD-10-CM

## 2019-10-17 MED ORDER — BUDESONIDE-FORMOTEROL FUMARATE 80-4.5 MCG/ACT IN AERO: INHALATION_SPRAY | RESPIRATORY_TRACT | 1 refills | Status: DC

## 2019-10-22 ENCOUNTER — Telehealth: Payer: Self-pay | Admitting: Family

## 2019-10-22 NOTE — Telephone Encounter (Signed)
Patient wants changed to something else. Please advise aware you are off and will handel when you are back in office

## 2019-10-23 MED ORDER — METFORMIN HCL ER 750 MG PO TB24: 750.0000 mg | ORAL_TABLET | Freq: Every day | ORAL | 1 refills | Status: DC

## 2019-10-23 NOTE — Telephone Encounter (Signed)
Metformin 1000 mg BID changed to Metformin XR 750 mg once a day. Eat with a large meal as this will help with diarrhea.

## 2019-10-23 NOTE — Telephone Encounter (Signed)
lmtcb

## 2019-10-27 NOTE — Telephone Encounter (Signed)
Left message to call back  

## 2019-10-31 NOTE — Telephone Encounter (Signed)
Attempted to contact pt without return call in over 3 days, will close encounter per office protocol.

## 2019-12-12 ENCOUNTER — Ambulatory Visit (INDEPENDENT_AMBULATORY_CARE_PROVIDER_SITE_OTHER): Payer: Medicare Other | Admitting: Family

## 2019-12-12 ENCOUNTER — Other Ambulatory Visit: Payer: Self-pay

## 2019-12-12 ENCOUNTER — Encounter: Payer: Self-pay | Admitting: Family

## 2019-12-12 VITALS — BP 136/87 | HR 89 | Temp 98.2°F | Ht 75.0 in | Wt 208.0 lb

## 2019-12-12 DIAGNOSIS — E1169 Type 2 diabetes mellitus with other specified complication: Secondary | ICD-10-CM | POA: Diagnosis not present

## 2019-12-12 DIAGNOSIS — F17200 Nicotine dependence, unspecified, uncomplicated: Secondary | ICD-10-CM

## 2019-12-12 DIAGNOSIS — E785 Hyperlipidemia, unspecified: Secondary | ICD-10-CM

## 2019-12-12 DIAGNOSIS — E559 Vitamin D deficiency, unspecified: Secondary | ICD-10-CM

## 2019-12-12 DIAGNOSIS — J4540 Moderate persistent asthma, uncomplicated: Secondary | ICD-10-CM

## 2019-12-12 DIAGNOSIS — E1100 Type 2 diabetes mellitus with hyperosmolarity without nonketotic hyperglycemic-hyperosmolar coma (NKHHC): Secondary | ICD-10-CM

## 2019-12-12 DIAGNOSIS — E663 Overweight: Secondary | ICD-10-CM

## 2019-12-12 LAB — BAYER DCA HB A1C WAIVED: HB A1C (BAYER DCA - WAIVED): 5.7 % (ref <7.0)

## 2019-12-12 MED ORDER — ATORVASTATIN CALCIUM 20 MG PO TABS: 20.0000 mg | ORAL_TABLET | Freq: Every day | ORAL | 1 refills | Status: DC

## 2019-12-12 MED ORDER — BUDESONIDE-FORMOTEROL FUMARATE 80-4.5 MCG/ACT IN AERO: INHALATION_SPRAY | RESPIRATORY_TRACT | 1 refills | Status: DC

## 2019-12-12 MED ORDER — METFORMIN HCL ER 750 MG PO TB24: 750.0000 mg | ORAL_TABLET | Freq: Every day | ORAL | 1 refills | Status: DC

## 2019-12-12 MED ORDER — GLIPIZIDE ER 10 MG PO TB24: 10.0000 mg | ORAL_TABLET | Freq: Every day | ORAL | 0 refills | Status: DC

## 2019-12-12 NOTE — Patient Instructions (Signed)

## 2019-12-12 NOTE — Progress Notes (Signed)
Subjective:    Patient ID: Casey Daniels, male    DOB: [birth year], 44 y.o.   MRN: [id number]  Chief Complaint  Patient presents with  . Medical Management of Chronic Issues    3 MTH, FASTING   . Diabetes   Pt presents to the office today for chronic follow up.  Diabetes He presents for his follow-up diabetic visit. He has type 2 diabetes mellitus. His disease course has been stable. There are no hypoglycemic associated symptoms. Pertinent negatives for diabetes include no blurred vision and no foot paresthesias. Symptoms are stable. Pertinent negatives for diabetic complications include no CVA, heart disease, nephropathy or peripheral neuropathy. Risk factors for coronary artery disease include dyslipidemia, diabetes mellitus, male sex, hypertension and sedentary lifestyle. He is following a generally healthy diet. His overall blood glucose range is 90-110 mg/dl. Eye exam is not current.  Hyperlipidemia This is a chronic problem. The current episode started more than 1 year ago. The problem is controlled. Recent lipid tests were reviewed and are normal. Current antihyperlipidemic treatment includes statins. The current treatment provides moderate improvement of lipids. Risk factors for coronary artery disease include dyslipidemia, diabetes mellitus, hypertension, male sex and a sedentary lifestyle.  Asthma He complains of cough and wheezing. This is a chronic problem. The current episode started more than 1 year ago. The problem occurs intermittently. His past medical history is significant for asthma.  Nicotine Dependence His urge triggers include company of smokers.      Review of Systems  Eyes: Negative for blurred vision.  Respiratory: Positive for cough and wheezing.   All other systems reviewed and are negative.      Objective:   Physical Exam Vitals reviewed.  Constitutional:      General: He is not in acute distress.    Appearance: He is well-developed.  HENT:      Head: Normocephalic.     Right Ear: Tympanic membrane normal.     Left Ear: Tympanic membrane normal.  Eyes:     General:        Right eye: No discharge.        Left eye: No discharge.     Pupils: Pupils are equal, round, and reactive to light.  Neck:     Thyroid: No thyromegaly.  Cardiovascular:     Rate and Rhythm: Normal rate and regular rhythm.     Heart sounds: Normal heart sounds. No murmur heard.   Pulmonary:     Effort: Pulmonary effort is normal. No respiratory distress.     Breath sounds: Normal breath sounds. No wheezing.  Abdominal:     General: Bowel sounds are normal. There is no distension.     Palpations: Abdomen is soft.     Tenderness: There is no abdominal tenderness.  Musculoskeletal:        General: No tenderness. Normal range of motion.     Cervical back: Normal range of motion and neck supple.  Skin:    General: Skin is warm and dry.     Findings: No erythema or rash.  Neurological:     Mental Status: He is alert and oriented to person, place, and time.     Cranial Nerves: No cranial nerve deficit.     Deep Tendon Reflexes: Reflexes are normal and symmetric.  Psychiatric:        Behavior: Behavior normal.        Thought Content: Thought content normal.        Judgment: Judgment  normal.       BP 136/87   Pulse 89   Temp 98.2 F (36.8 C) (Temporal)   Ht _0  (1.905 m)   Wt 208 lb (94.3 kg)   SpO2 98%   BMI 26.00 kg/m      Assessment & Plan:  Casey Daniels comes in today with chief complaint of Medical Management of Chronic Issues (3 MTH, FASTING ) and Diabetes   Diagnosis and orders addressed:  1. Moderate persistent asthma without complication - budesonide-formoterol (SYMBICORT) 80-4.5 MCG/ACT inhaler; 2 PUFFS 2 TIMES A DAY  Dispense: 10.2 g; Refill: 1 - CMP14+EGFR  2. Type 2 diabetes mellitus with hyperosmolarity without coma, without long-term current use of insulin (HCC) - metFORMIN (GLUCOPHAGE XR) 750 MG 24 hr tablet; Take  1 tablet (750 mg total) by mouth daily with breakfast.  Dispense: 90 tablet; Refill: 1 - glipiZIDE (GLUCOTROL XL) 10 MG 24 hr tablet; Take 1 tablet (10 mg total) by mouth daily with breakfast.  Dispense: 30 tablet; Refill: 0 - Bayer DCA Hb A1c Waived - CMP14+EGFR  3. Hyperlipidemia associated with type 2 diabetes mellitus (HCC) - atorvastatin (LIPITOR) 20 MG tablet; Take 1 tablet (20 mg total) by mouth daily.  Dispense: 90 tablet; Refill: 1 - CMP14+EGFR  4. Current smoker - CMP14+EGFR  5. Overweight (BMI 25.0-29.9) - CMP14+EGFR  6. Vitamin D deficiency - CMP14+EGFR   Labs pending Health Maintenance reviewed Diet and exercise encouraged  Follow up plan: 6 months   Evelina Dun, FNP

## 2019-12-13 LAB — CMP14+EGFR
ALT: 19 IU/L (ref 0–44)
AST: 19 IU/L (ref 0–40)
Albumin/Globulin Ratio: 1.8 (ref 1.2–2.2)
Albumin: 4.5 g/dL (ref 4.0–5.0)
Alkaline Phosphatase: 61 IU/L (ref 48–121)
BUN/Creatinine Ratio: 13 (ref 9–20)
BUN: 16 mg/dL (ref 6–24)
Bilirubin Total: 0.2 mg/dL (ref 0.0–1.2)
CO2: 25 mmol/L (ref 20–29)
Calcium: 10 mg/dL (ref 8.7–10.2)
Chloride: 104 mmol/L (ref 96–106)
Creatinine, Ser: 1.26 mg/dL (ref 0.76–1.27)
GFR calc Af Amer: 79 mL/min/{1.73_m2} (ref >59)
GFR calc non Af Amer: 68 mL/min/{1.73_m2} (ref >59)
Globulin, Total: 2.5 g/dL (ref 1.5–4.5)
Glucose: 91 mg/dL (ref 65–99)
Potassium: 4.4 mmol/L (ref 3.5–5.2)
Sodium: 142 mmol/L (ref 134–144)
Total Protein: 7 g/dL (ref 6.0–8.5)

## 2019-12-22 ENCOUNTER — Encounter: Payer: Self-pay | Admitting: Nutrition

## 2019-12-22 ENCOUNTER — Other Ambulatory Visit: Payer: Self-pay

## 2019-12-22 ENCOUNTER — Encounter: Payer: Medicare Other | Attending: Family | Admitting: Nutrition

## 2019-12-22 DIAGNOSIS — E663 Overweight: Secondary | ICD-10-CM | POA: Insufficient documentation

## 2019-12-22 DIAGNOSIS — E1100 Type 2 diabetes mellitus with hyperosmolarity without nonketotic hyperglycemic-hyperosmolar coma (NKHHC): Secondary | ICD-10-CM | POA: Insufficient documentation

## 2019-12-22 NOTE — Progress Notes (Signed)
Phone Visit Medical Nutrition Therapy:  Appt start time: 1030 end time:  2620  Assessment:  Primary concerns today: Diabetes Type 2.  He notes he has been doing really well and his A1C was down to 5.7%. He notes the Metformin caused him diarrhea and it was reduce to 750 mg instead of 1000 mg BID. Still on Glipizide 10 mg daily.  He is eating better balanced meals. Walks for exercise. Feels good.   CMP Latest Ref Rng & Units 12/12/2019 09/16/2019 07/31/2019  Glucose 65 - 99 mg/dL 91 92 128(H)  BUN 6 - 24 mg/dL 16 15 21   Creatinine 0.76 - 1.27 mg/dL 1.26 1.07 1.29(H)  Sodium 134 - 144 mmol/L 142 143 141  Potassium 3.5 - 5.2 mmol/L 4.4 4.5 4.1  Chloride 96 - 106 mmol/L 104 104 103  CO2 20 - 29 mmol/L 25 21 20   Calcium 8.7 - 10.2 mg/dL 10.0 9.9 10.0  Total Protein 6.0 - 8.5 g/dL 7.0 - 7.5  Total Bilirubin 0.0 - 1.2 mg/dL 0.2 - 0.3  Alkaline Phos 48 - 121 IU/L 61 - 69  AST 0 - 40 IU/L 19 - 30  ALT 0 - 44 IU/L 19 - 21   Lab Results  Component Value Date   HGBA1C 5.7 12/12/2019    Preferred Learning Style:   No preference indicated   Learning Readiness:   Ready  Change in progress  MEDICATIONS:    DIETARY INTAKE:   24-hr recall:   Eats 3 meals per day.   Usual physical activity: walks and rides his bike   Estimated energy needs: 2000-2200  calories 248 g carbohydrates 106 g protein 61 g fat  Progress Towards Goal(s):  In progress.   Nutritional Diagnosis:  NB-1.1 Food and nutrition-related knowledge deficit As related to Diabetes .  As evidenced by BS > 300 mg/dl and A1C 5.9%.    Intervention:  Nutrition and Diabetes education provided on My Plate, CHO counting, meal planning, portion sizes, timing of meals, avoiding snacks between meals unless having a low blood sugar, target ranges for A1C and blood sugars, signs/symptoms and treatment of hyper/hypoglycemia, monitoring blood sugars, taking medications as prescribed, benefits of exercising 30 minutes per day and  prevention of complications of DM.  Goals Keep up the great job! Take Metformin AFTER eating breakfast. Follow My Plate Eat meals on time. Eat 4-5 carb choices per meal Continue to increase fresh fruits and vegetables. Drink only water. No diet sodas or crystal light. Walk daily. Keep BS less than 150 mg/dl at night.   Teaching Method Utilized:  Visual Auditory Hands on  Handouts given during visit include:  The Plate Method    Meal Plan Card  Diabetes Instructions.   Barriers to learning/adherence to lifestyle change:  none  Demonstrated degree of understanding via:  Teach Back   Monitoring/Evaluation:  Dietary intake, exercise, , and body weight in 6 month(s).

## 2019-12-22 NOTE — Patient Instructions (Signed)
Goals Keep up the great job! Take Metformin AFTER eating breakfast. Follow My Plate Eat meals on time. Eat 4-5 carb choices per meal Continue to increase fresh fruits and vegetables. Drink only water. No diet sodas or crystal light. Walk daily. Keep BS less than 150 mg/dl at night.

## 2019-12-26 ENCOUNTER — Telehealth: Payer: Self-pay | Admitting: Family

## 2019-12-26 NOTE — Telephone Encounter (Signed)
  Prescription Request  12/26/2019  What is the name of the medication or equipment? Metformin  Have you contacted your pharmacy to request a refill? (if applicable) yes  Which pharmacy would you like this sent to? [facility]    Patient notified that their request is being sent to the clinical staff for review and that they should receive a response within 2 business days.

## 2019-12-31 NOTE — Telephone Encounter (Signed)
No return call 

## 2020-01-13 ENCOUNTER — Encounter: Payer: Self-pay | Admitting: Nutrition

## 2020-02-03 ENCOUNTER — Ambulatory Visit: Payer: Medicare Other

## 2020-02-04 ENCOUNTER — Ambulatory Visit: Payer: Medicare Other

## 2020-02-11 IMAGING — DX DG KNEE 1-2V*L*
2 series · 2 of 2 positions shown · non-contrast
Comparison: None.

CLINICAL DATA: Left knee pain following fall 3 days ago, initial
encounter

EXAM:
LEFT KNEE - 2 VIEW

[knee ap]
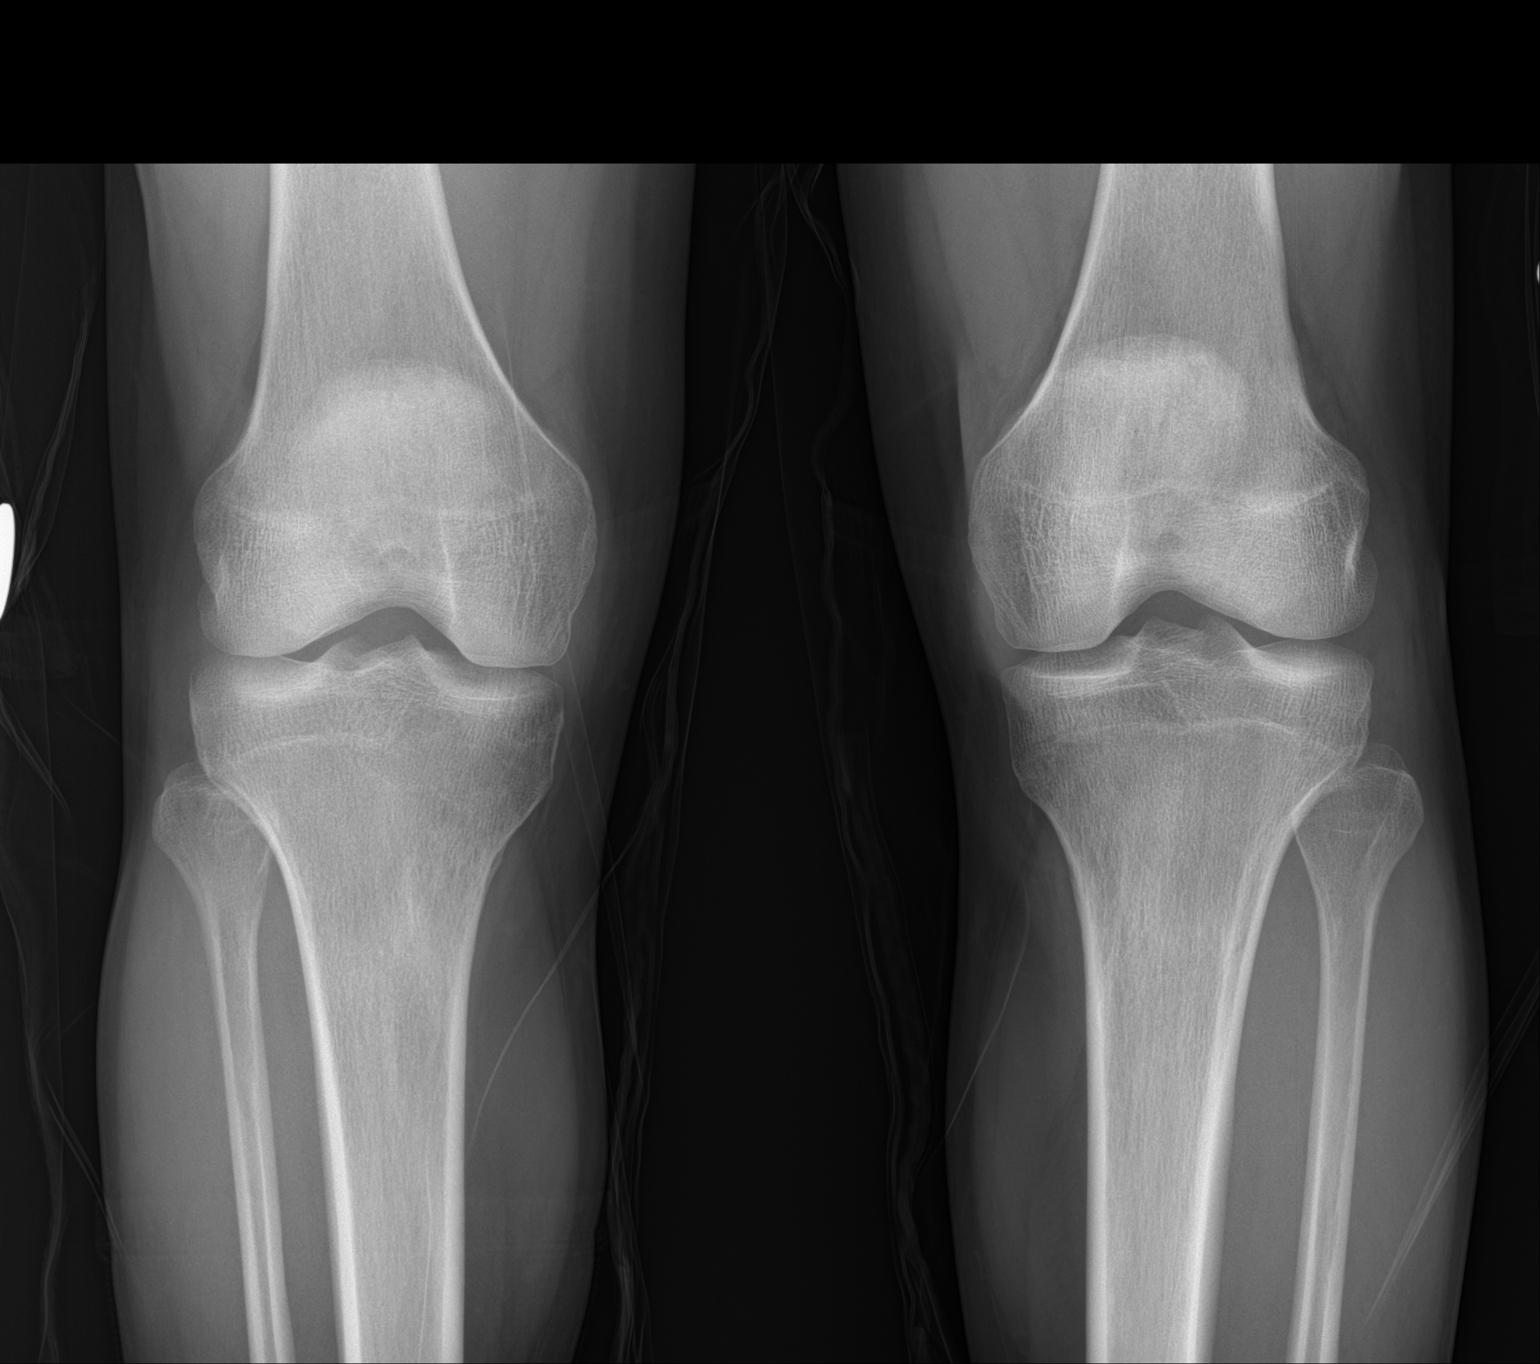

[knee lat]
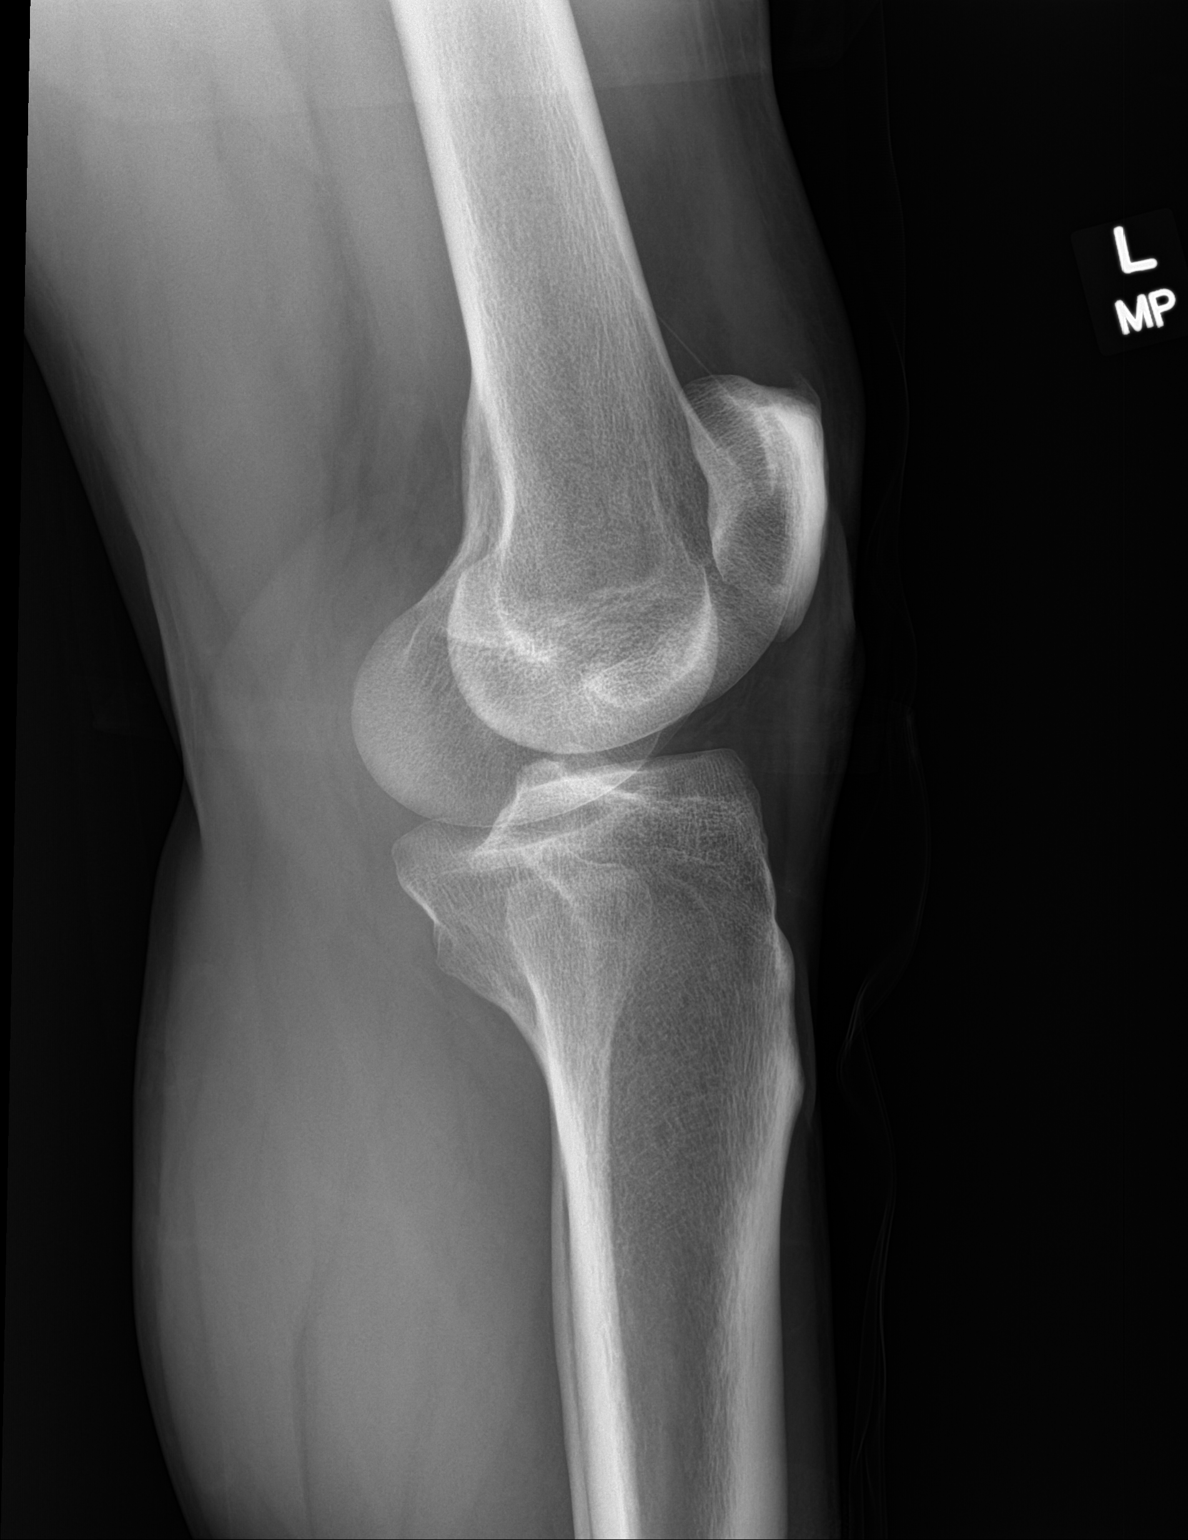

[2 of 2 positions shown; findings below may reference images not displayed]

FINDINGS: No acute fracture or dislocation is noted. No degenerative changes
are seen. No soft tissue abnormality is noted.
IMPRESSION: No acute abnormality noted.

## 2020-03-08 ENCOUNTER — Telehealth: Payer: Medicare Other | Admitting: *Deleted

## 2020-06-11 ENCOUNTER — Ambulatory Visit (INDEPENDENT_AMBULATORY_CARE_PROVIDER_SITE_OTHER): Payer: Medicare Other | Admitting: Family

## 2020-06-11 ENCOUNTER — Other Ambulatory Visit: Payer: Self-pay | Admitting: Family

## 2020-06-11 ENCOUNTER — Other Ambulatory Visit: Payer: Self-pay

## 2020-06-11 ENCOUNTER — Encounter: Payer: Self-pay | Admitting: Family

## 2020-06-11 VITALS — BP 123/76 | HR 96 | Temp 97.1°F | Ht 75.0 in | Wt 216.6 lb

## 2020-06-11 DIAGNOSIS — E1100 Type 2 diabetes mellitus with hyperosmolarity without nonketotic hyperglycemic-hyperosmolar coma (NKHHC): Secondary | ICD-10-CM | POA: Diagnosis not present

## 2020-06-11 DIAGNOSIS — E1169 Type 2 diabetes mellitus with other specified complication: Secondary | ICD-10-CM

## 2020-06-11 DIAGNOSIS — J4540 Moderate persistent asthma, uncomplicated: Secondary | ICD-10-CM

## 2020-06-11 DIAGNOSIS — E785 Hyperlipidemia, unspecified: Secondary | ICD-10-CM

## 2020-06-11 DIAGNOSIS — F1010 Alcohol abuse, uncomplicated: Secondary | ICD-10-CM

## 2020-06-11 DIAGNOSIS — E663 Overweight: Secondary | ICD-10-CM

## 2020-06-11 DIAGNOSIS — F17200 Nicotine dependence, unspecified, uncomplicated: Secondary | ICD-10-CM

## 2020-06-11 DIAGNOSIS — E559 Vitamin D deficiency, unspecified: Secondary | ICD-10-CM | POA: Diagnosis not present

## 2020-06-11 DIAGNOSIS — J4541 Moderate persistent asthma with (acute) exacerbation: Secondary | ICD-10-CM | POA: Diagnosis not present

## 2020-06-11 DIAGNOSIS — Z1211 Encounter for screening for malignant neoplasm of colon: Secondary | ICD-10-CM | POA: Diagnosis not present

## 2020-06-11 DIAGNOSIS — H6123 Impacted cerumen, bilateral: Secondary | ICD-10-CM

## 2020-06-11 LAB — BAYER DCA HB A1C WAIVED: HB A1C (BAYER DCA - WAIVED): 5.9 % (ref <7.0)

## 2020-06-11 MED ORDER — ATORVASTATIN CALCIUM 20 MG PO TABS: 20.0000 mg | ORAL_TABLET | Freq: Every day | ORAL | 1 refills | Status: DC

## 2020-06-11 MED ORDER — METFORMIN HCL ER 750 MG PO TB24: 750.0000 mg | ORAL_TABLET | Freq: Every day | ORAL | 1 refills | Status: DC

## 2020-06-11 NOTE — Progress Notes (Signed)
Subjective:    Patient ID: Casey Daniels, male    DOB: [birth year], 45 y.o.   MRN: [id number]  Chief Complaint  Patient presents with  . Diabetes    Check up of chronic medical conditions    Pt presents to the office today for chronic follow up.He reports he drinks 3-4 beers a day.  Diabetes He presents for his follow-up diabetic visit. He has type 2 diabetes mellitus. His disease course has been stable. There are no hypoglycemic associated symptoms. Pertinent negatives for diabetes include no blurred vision and no foot paresthesias. There are no hypoglycemic complications. Symptoms are stable. There are no diabetic complications. Risk factors for coronary artery disease include dyslipidemia, diabetes mellitus, male sex, hypertension and sedentary lifestyle. He is following a generally healthy diet. His overall blood glucose range is 90-110 mg/dl.  Hyperlipidemia This is a chronic problem. The current episode started more than 1 year ago. The problem is controlled. Recent lipid tests were reviewed and are normal. Exacerbating diseases include obesity. Associated symptoms include shortness of breath. Current antihyperlipidemic treatment includes statins. The current treatment provides moderate improvement of lipids. Risk factors for coronary artery disease include diabetes mellitus, hypertension, a sedentary lifestyle and dyslipidemia.  Nicotine Dependence Presents for follow-up visit. His urge triggers include company of smokers. The symptoms have been stable. He smokes < 1/2 a pack of cigarettes per day.  Asthma He complains of cough, shortness of breath and wheezing. The problem occurs intermittently. His past medical history is significant for asthma.      Review of Systems  Eyes: Negative for blurred vision.  Respiratory: Positive for cough, shortness of breath and wheezing.   All other systems reviewed and are negative.      Objective:   Physical Exam Vitals reviewed.   Constitutional:      General: He is not in acute distress.    Appearance: He is well-developed.  HENT:     Head: Normocephalic.     Right Ear: Tympanic membrane normal. There is impacted cerumen.     Left Ear: Tympanic membrane normal. There is impacted cerumen.  Eyes:     General:        Right eye: No discharge.        Left eye: No discharge.     Pupils: Pupils are equal, round, and reactive to light.  Neck:     Thyroid: No thyromegaly.  Cardiovascular:     Rate and Rhythm: Normal rate and regular rhythm.     Heart sounds: Normal heart sounds. No murmur heard.   Pulmonary:     Effort: Pulmonary effort is normal. No respiratory distress.     Breath sounds: Normal breath sounds. No wheezing.  Abdominal:     General: Bowel sounds are normal. There is no distension.     Palpations: Abdomen is soft.     Tenderness: There is no abdominal tenderness.  Musculoskeletal:        General: No tenderness. Normal range of motion.     Cervical back: Normal range of motion and neck supple.  Skin:    General: Skin is warm and dry.     Findings: No erythema or rash.  Neurological:     Mental Status: He is alert and oriented to person, place, and time.     Cranial Nerves: No cranial nerve deficit.     Deep Tendon Reflexes: Reflexes are normal and symmetric.  Psychiatric:        Behavior: Behavior normal.  Thought Content: Thought content normal.        Judgment: Judgment normal.    Bilateral ears cleaned with warm water and peroxide. TM WNL  BP 123/76   Pulse 96   Temp (!) 97.1 F (36.2 C) (Temporal)   Ht _0  (1.905 m)   Wt 216 lb 9.6 oz (98.2 kg)   SpO2 97%   BMI 27.07 kg/m      Assessment & Plan:  Casey Daniels comes in today with chief complaint of Diabetes (Check up of chronic medical conditions )   Diagnosis and orders addressed:  1. Hyperlipidemia associated with type 2 diabetes mellitus (HCC) - atorvastatin (LIPITOR) 20 MG tablet; Take 1 tablet (20 mg  total) by mouth daily.  Dispense: 90 tablet; Refill: 1 - CMP14+EGFR - CBC with Differential/Platelet - Lipid panel  2. Type 2 diabetes mellitus with hyperosmolarity without coma, without long-term current use of insulin (HCC) - metFORMIN (GLUCOPHAGE XR) 750 MG 24 hr tablet; Take 1 tablet (750 mg total) by mouth daily with breakfast.  Dispense: 90 tablet; Refill: 1 - CMP14+EGFR - CBC with Differential/Platelet - Bayer DCA Hb A1c Waived - Microalbumin / creatinine urine ratio  3. Moderate persistent asthma with acute exacerbation - CMP14+EGFR - CBC with Differential/Platelet  4. Vitamin D deficiency - CMP14+EGFR - CBC with Differential/Platelet  5. Overweight (BMI 25.0-29.9) - CMP14+EGFR - CBC with Differential/Platelet  6. Current smoker - CMP14+EGFR - CBC with Differential/Platelet  7. Colon cancer screening - Ambulatory referral to Gastroenterology - CMP14+EGFR - CBC with Differential/Platelet  8. Excessive drinking of alcohol  9. Bilateral impacted cerumen Uses OTC drops  Labs pending Health Maintenance reviewed Diet and exercise encouraged  Follow up plan: 3 months    Casey Dun, FNP

## 2020-06-11 NOTE — Patient Instructions (Signed)
Alcohol Abuse and Dependence Information, Adult Alcohol is a widely available drug. People drink alcohol in different amounts. People who drink alcohol very often and in large amounts often have problems during and after drinking. They may develop what is called an alcohol use disorder. There are two main types of alcohol use disorders:  Alcohol abuse. This is when you use alcohol too much or too often. You may use alcohol to make yourself feel happy or to reduce stress. You may have a hard time setting a limit on the amount you drink.  Alcohol dependence. This is when you use alcohol consistently for a period of time, and your body changes as a result. This can make it hard to stop drinking because you may start to feel sick or feel different when you do not use alcohol. These symptoms are known as withdrawal. How can alcohol abuse and dependence affect me? Alcohol abuse and dependence can have a negative effect on your life. Drinking too much can lead to addiction. You may feel like you need alcohol to function normally. You may drink alcohol before work in the morning, during the day, or as soon as you get home from work in the evening. These actions can result in:  Poor work performance.  Job loss.  Financial problems.  Car crashes or criminal charges from driving after drinking alcohol.  Problems in your relationships with friends and family.  Losing the trust and respect of coworkers, friends, and family. Drinking heavily over a long period of time can permanently damage your body and brain, and can cause lifelong health issues, such as:  Damage to your liver or pancreas.  Heart problems, high blood pressure, or stroke.  Certain cancers.  Decreased ability to fight infections.  Brain or nerve damage.  Depression.  Early (premature) death. If you are careless or you crave alcohol, it is easy to drink more than your body can handle (overdose). Alcohol overdose is a serious  situation that requires hospitalization. It may lead to permanent injuries or death. What can increase my risk?  Having a family history of alcohol abuse.  Having depression or other mental health conditions.  Beginning to drink at an early age.  Binge drinking often.  Experiencing trauma, stress, and an unstable home life during childhood.  Spending time with people who drink often. What actions can I take to prevent or manage alcohol abuse and dependence?  Do not drink alcohol if: ? Your health care provider tells you not to drink. ? You are pregnant, may be pregnant, or are planning to become pregnant.  If you drink alcohol: ? Limit how much you use to:  0-1 drink a day for women.  0-2 drinks a day for men. ? Be aware of how much alcohol is in your drink. In the [place] one drink equals one 12 oz bottle of beer (355 mL), one 5 oz glass of wine (148 mL), or one 1 oz glass of hard liquor (44 mL).  Stop drinking if you have been drinking too much. This can be very hard to do if you are used to abusing alcohol. If you begin to have withdrawal symptoms, talk with your health care provider or a person that you trust. These symptoms may include anxiety, shaky hands, headache, nausea, sweating, or not being able to sleep.  Choose to drink nonalcoholic beverages in social gatherings and places where there may be alcohol. Activity  Spend more time on activities that you enjoy that do   not involve alcohol, like hobbies or exercise.  Find healthy ways to cope with stress, such as exercise, meditation, or spending time with people you care about. General information  Talk to your family, coworkers, and friends about supporting you in your efforts to stop drinking. If they drink, ask them not to drink around you. Spend more time with people who do not drink alcohol.  If you think that you have an alcohol dependency problem: ? Tell friends or family about your concerns. ? Talk with your  health care provider or another health professional about where to get help. ? Work with a Transport planner and a Regulatory affairs officer. ? Consider joining a support group for people who struggle with alcohol abuse and dependence. Where to find support  Your health care provider.  SMART Recovery: www.smartrecovery.org Therapy and support groups  Local treatment centers or chemical dependency counselors.  Local AA groups in your community: [employer]Tax.com.pt   Where to find more information  Centers for Disease Control and Prevention: http://www.wolf.info/  National Institute on Alcohol Abuse and Alcoholism: http://www.bradshaw.com/  Alcoholics Anonymous (AA): NicTax.com.pt Contact a health care provider if:  You drank more or for longer than you intended on more than one occasion.  You tried to stop drinking or to cut back on how much you drink, but you were not able to.  You often drink to the point of vomiting or passing out.  You want to drink so badly that you cannot think about anything else.  You have problems in your life due to drinking, but you continue to drink.  You keep drinking even though you feel anxious, depressed, or have experienced memory loss.  You have stopped doing the things you used to enjoy in order to drink.  You have to drink more than you used to in order to get the effect you want.  You experience anxiety, sweating, nausea, shakiness, and trouble sleeping when you try to stop drinking. Get help right away if:  You have thoughts about hurting yourself or others.  You have serious withdrawal symptoms, including: ? Confusion. ? Racing heart. ? High blood pressure. ? Fever. If you ever feel like you may hurt yourself or others, or have thoughts about taking your own life, get help right away. You can go to your nearest emergency department or call:  Your local emergency services (911 in the [place]  A suicide crisis helpline, such as the [facility] at (614) 505-2892. This is open 24 hours a day. Summary  Alcohol abuse and dependence can have a negative effect on your life. Drinking too much or too often can lead to addiction.  If you drink alcohol, limit how much you use.  If you are having trouble keeping your drinking under control, find ways to change your behavior. Hobbies, calming activities, exercise, or support groups can help.  If you feel you need help with changing your drinking habits, talk with your health care provider, a good friend, or a therapist, or go to an [facility] group. This information is not intended to replace advice given to you by your health care provider. Make sure you discuss any questions you have with your health care provider. Document Revised: 07/09/2018 Document Reviewed: 05/28/2018 Elsevier Patient Education  [employer].

## 2020-06-12 LAB — CBC WITH DIFFERENTIAL/PLATELET
Basophils Absolute: 0 10*3/uL (ref 0.0–0.2)
Basos: 0 %
EOS (ABSOLUTE): 0.2 10*3/uL (ref 0.0–0.4)
Eos: 3 %
Hematocrit: 42.9 % (ref 37.5–51.0)
Hemoglobin: 14.5 g/dL (ref 13.0–17.7)
Immature Grans (Abs): 0 10*3/uL (ref 0.0–0.1)
Immature Granulocytes: 0 %
Lymphocytes Absolute: 1.7 10*3/uL (ref 0.7–3.1)
Lymphs: 30 %
MCH: 30.4 pg (ref 26.6–33.0)
MCHC: 33.8 g/dL (ref 31.5–35.7)
MCV: 90 fL (ref 79–97)
Monocytes Absolute: 0.5 10*3/uL (ref 0.1–0.9)
Monocytes: 8 %
Neutrophils Absolute: 3.3 10*3/uL (ref 1.4–7.0)
Neutrophils: 59 %
Platelets: 254 10*3/uL (ref 150–450)
RBC: 4.77 x10E6/uL (ref 4.14–5.80)
RDW: 12.8 % (ref 11.6–15.4)
WBC: 5.7 10*3/uL (ref 3.4–10.8)

## 2020-06-12 LAB — CMP14+EGFR
ALT: 35 IU/L (ref 0–44)
AST: 33 IU/L (ref 0–40)
Albumin/Globulin Ratio: 1.7 (ref 1.2–2.2)
Albumin: 4.7 g/dL (ref 4.0–5.0)
Alkaline Phosphatase: 74 IU/L (ref 44–121)
BUN/Creatinine Ratio: 16 (ref 9–20)
BUN: 21 mg/dL (ref 6–24)
Bilirubin Total: 0.3 mg/dL (ref 0.0–1.2)
CO2: 24 mmol/L (ref 20–29)
Calcium: 10.2 mg/dL (ref 8.7–10.2)
Chloride: 104 mmol/L (ref 96–106)
Creatinine, Ser: 1.34 mg/dL — ABNORMAL HIGH (ref 0.76–1.27)
Globulin, Total: 2.8 g/dL (ref 1.5–4.5)
Glucose: 92 mg/dL (ref 65–99)
Potassium: 4.8 mmol/L (ref 3.5–5.2)
Sodium: 142 mmol/L (ref 134–144)
Total Protein: 7.5 g/dL (ref 6.0–8.5)
eGFR: 66 mL/min/{1.73_m2} (ref >59)

## 2020-06-12 LAB — MICROALBUMIN / CREATININE URINE RATIO
Creatinine, Urine: 222.3 mg/dL
Microalb/Creat Ratio: 5 mg/g creat (ref 0–29)
Microalbumin, Urine: 10.7 ug/mL

## 2020-06-12 LAB — LIPID PANEL
Chol/HDL Ratio: 2.4 ratio (ref 0.0–5.0)
Cholesterol, Total: 147 mg/dL (ref 100–199)
HDL: 62 mg/dL (ref >39)
LDL Chol Calc (NIH): 62 mg/dL (ref 0–99)
Triglycerides: 134 mg/dL (ref 0–149)
VLDL Cholesterol Cal: 23 mg/dL (ref 5–40)

## 2020-06-17 ENCOUNTER — Encounter: Payer: Self-pay | Admitting: Internal Medicine

## 2020-07-22 ENCOUNTER — Encounter: Payer: Self-pay | Admitting: Internal Medicine

## 2020-07-22 ENCOUNTER — Ambulatory Visit: Payer: Medicare Other | Admitting: Nurse Practitioner

## 2020-08-24 DIAGNOSIS — Z1159 Encounter for screening for other viral diseases: Secondary | ICD-10-CM | POA: Diagnosis not present

## 2020-09-10 ENCOUNTER — Ambulatory Visit (INDEPENDENT_AMBULATORY_CARE_PROVIDER_SITE_OTHER): Payer: Medicare Other

## 2020-09-10 VITALS — Ht 75.0 in | Wt 216.0 lb

## 2020-09-10 DIAGNOSIS — Z0000 Encounter for general adult medical examination without abnormal findings: Secondary | ICD-10-CM | POA: Diagnosis not present

## 2020-09-10 NOTE — Patient Instructions (Signed)
Casey Daniels , Thank you for taking time to come for your Medicare Wellness Visit. I appreciate your ongoing commitment to your health goals. Please review the following plan we discussed and let me know if I can assist you in the future.   Screening recommendations/referrals: Colonoscopy: Due at age 45 Recommended yearly ophthalmology/optometry visit for glaucoma screening and checkup Recommended yearly dental visit for hygiene and checkup  Vaccinations: Influenza vaccine: Done 01/2020 - Repeat annually Pneumococcal vaccine: Done 09/16/2019 - next due one year later Tdap vaccine: Done 02/19/2015  Repeat in 10 years Shingles vaccine: Due after age 52   Covid-19: Done 07/10/2019 & 08/07/2019 - we need date of booster  Advanced directives: Advance directive discussed with you today. Even though you declined this today, please call our office should you change your mind, and we can give you the proper paperwork for you to fill out.  Conditions/risks identified: Aim for 30 minutes of exercise or brisk walking each day, drink 6-8 glasses of water and eat lots of fruits and vegetables.  Next appointment: Follow up in one year for your annual wellness visit.   Preventive Care 12 Years and Older, Male  Preventive care refers to lifestyle choices and visits with your health care provider that can promote health and wellness. What does preventive care include? A yearly physical exam. This is also called an annual well check. Dental exams once or twice a year. Routine eye exams. Ask your health care provider how often you should have your eyes checked. Personal lifestyle choices, including: Daily care of your teeth and gums. Regular physical activity. Eating a healthy diet. Avoiding tobacco and drug use. Limiting alcohol use. Practicing safe sex. Taking low doses of aspirin every day. Taking vitamin and mineral supplements as recommended by your health care provider. What happens during an  annual well check? The services and screenings done by your health care provider during your annual well check will depend on your age, overall health, lifestyle risk factors, and family history of disease. Counseling  Your health care provider may ask you questions about your: Alcohol use. Tobacco use. Drug use. Emotional well-being. Home and relationship well-being. Sexual activity. Eating habits. History of falls. Memory and ability to understand (cognition). Work and work Statistician. Screening  You may have the following tests or measurements: Height, weight, and BMI. Blood pressure. Lipid and cholesterol levels. These may be checked every 5 years, or more frequently if you are over 39 years old. Skin check. Lung cancer screening. You may have this screening every year starting at age 5 if you have a 30-pack-year history of smoking and currently smoke or have quit within the past 15 years. Fecal occult blood test (FOBT) of the stool. You may have this test every year starting at age 71. Flexible sigmoidoscopy or colonoscopy. You may have a sigmoidoscopy every 5 years or a colonoscopy every 10 years starting at age 64. Prostate cancer screening. Recommendations will vary depending on your family history and other risks. Hepatitis C blood test. Hepatitis B blood test. Sexually transmitted disease (STD) testing. Diabetes screening. This is done by checking your blood sugar (glucose) after you have not eaten for a while (fasting). You may have this done every 1-3 years. Abdominal aortic aneurysm (AAA) screening. You may need this if you are a current or former smoker. Osteoporosis. You may be screened starting at age 23 if you are at high risk. Talk with your health care provider about your test results, treatment options,  and if necessary, the need for more tests. Vaccines  Your health care provider may recommend certain vaccines, such as: Influenza vaccine. This is recommended  every year. Tetanus, diphtheria, and acellular pertussis (Tdap, Td) vaccine. You may need a Td booster every 10 years. Zoster vaccine. You may need this after age 52. Pneumococcal 13-valent conjugate (PCV13) vaccine. One dose is recommended after age 38. Pneumococcal polysaccharide (PPSV23) vaccine. One dose is recommended after age 22. Talk to your health care provider about which screenings and vaccines you need and how often you need them. This information is not intended to replace advice given to you by your health care provider. Make sure you discuss any questions you have with your health care provider. Document Released: 04/16/2015 Document Revised: 12/08/2015 Document Reviewed: 01/19/2015 Elsevier Interactive Patient Education  2017 [place] Prevention in the Home Falls can cause injuries. They can happen to people of all ages. There are many things you can do to make your home safe and to help prevent falls. What can I do on the outside of my home? Regularly fix the edges of walkways and driveways and fix any cracks. Remove anything that might make you trip as you walk through a door, such as a raised step or threshold. Trim any bushes or trees on the path to your home. Use bright outdoor lighting. Clear any walking paths of anything that might make someone trip, such as rocks or tools. Regularly check to see if handrails are loose or broken. Make sure that both sides of any steps have handrails. Any raised decks and porches should have guardrails on the edges. Have any leaves, snow, or ice cleared regularly. Use sand or salt on walking paths during winter. Clean up any spills in your garage right away. This includes oil or grease spills. What can I do in the bathroom? Use night lights. Install grab bars by the toilet and in the tub and shower. Do not use towel bars as grab bars. Use non-skid mats or decals in the tub or shower. If you need to sit down in the shower,  use a plastic, non-slip stool. Keep the floor dry. Clean up any water that spills on the floor as soon as it happens. Remove soap buildup in the tub or shower regularly. Attach bath mats securely with double-sided non-slip rug tape. Do not have throw rugs and other things on the floor that can make you trip. What can I do in the bedroom? Use night lights. Make sure that you have a light by your bed that is easy to reach. Do not use any sheets or blankets that are too big for your bed. They should not hang down onto the floor. Have a firm chair that has side arms. You can use this for support while you get dressed. Do not have throw rugs and other things on the floor that can make you trip. What can I do in the kitchen? Clean up any spills right away. Avoid walking on wet floors. Keep items that you use a lot in easy-to-reach places. If you need to reach something above you, use a strong step stool that has a grab bar. Keep electrical cords out of the way. Do not use floor polish or wax that makes floors slippery. If you must use wax, use non-skid floor wax. Do not have throw rugs and other things on the floor that can make you trip. What can I do with my stairs? Do not leave  any items on the stairs. Make sure that there are handrails on both sides of the stairs and use them. Fix handrails that are broken or loose. Make sure that handrails are as long as the stairways. Check any carpeting to make sure that it is firmly attached to the stairs. Fix any carpet that is loose or worn. Avoid having throw rugs at the top or bottom of the stairs. If you do have throw rugs, attach them to the floor with carpet tape. Make sure that you have a light switch at the top of the stairs and the bottom of the stairs. If you do not have them, ask someone to add them for you. What else can I do to help prevent falls? Wear shoes that: Do not have high heels. Have rubber bottoms. Are comfortable and fit you  well. Are closed at the toe. Do not wear sandals. If you use a stepladder: Make sure that it is fully opened. Do not climb a closed stepladder. Make sure that both sides of the stepladder are locked into place. Ask someone to hold it for you, if possible. Clearly mark and make sure that you can see: Any grab bars or handrails. First and last steps. Where the edge of each step is. Use tools that help you move around (mobility aids) if they are needed. These include: Canes. Walkers. Scooters. Crutches. Turn on the lights when you go into a dark area. Replace any light bulbs as soon as they burn out. Set up your furniture so you have a clear path. Avoid moving your furniture around. If any of your floors are uneven, fix them. If there are any pets around you, be aware of where they are. Review your medicines with your doctor. Some medicines can make you feel dizzy. This can increase your chance of falling. Ask your doctor what other things that you can do to help prevent falls. This information is not intended to replace advice given to you by your health care provider. Make sure you discuss any questions you have with your health care provider. Document Released: 01/14/2009 Document Revised: 08/26/2015 Document Reviewed: 04/24/2014 Elsevier Interactive Patient Education  2017 [employer].

## 2020-09-10 NOTE — Progress Notes (Signed)
Subjective:   Casey Daniels is a 45 y.o. male who presents for Medicare Annual/Subsequent preventive examination.  Review of Systems     Cardiac Risk Factors include: diabetes mellitus;smoking/ tobacco exposure;sedentary lifestyle;dyslipidemia;hypertension;male gender     Objective:    Today's Vitals   09/10/20 1533  Weight: 216 lb (98 kg)  Height: 6' 3"  (1.905 m)   Body mass index is 27 kg/m.  Advanced Directives 09/10/2020 06/18/2019 03/14/2019 03/10/2019 03/13/2018 02/26/2017  Does Patient Have a Medical Advance Directive? No No - No No No  Would patient like information on creating a medical advance directive? No - Patient declined No - Patient declined No - Patient declined - No - Patient declined -    Current Medications (verified) Outpatient Encounter Medications as of 09/10/2020  Medication Sig   Accu-Chek Softclix Lancets lancets Check BS twice daily and as needed Dx E11.9   atorvastatin (LIPITOR) 20 MG tablet Take 1 tablet (20 mg total) by mouth daily.   blood glucose meter kit and supplies Check BS BID and PRN (dx E11.9)   glucose blood (GLUCOSE METER TEST) test strip Use BID   metFORMIN (GLUCOPHAGE XR) 750 MG 24 hr tablet Take 1 tablet (750 mg total) by mouth daily with breakfast.   SYMBICORT 80-4.5 MCG/ACT inhaler 2 PUFFS 2 TIMES A DAY   No facility-administered encounter medications on file as of 09/10/2020.    Allergies (verified) Bee venom   History: Past Medical History:  Diagnosis Date   Arthritis    bilateral ankles due to club foot release as an infant   Asthma    Diabetes mellitus without complication ([place])    Hyperlipidemia    Past Surgical History:  Procedure Laterality Date   CLUB FOOT RELEASE Bilateral    INCISION AND DRAINAGE ABSCESS N/A 03/12/2019   Procedure: INCISION AND DRAINAGE NECK ABSCESS;  Surgeon: Leta Baptist, MD;  Location: [facility];  Service: ENT;  Laterality: N/A;   Family History  Problem Relation Age of Onset   Diabetes  Mother    Healthy Brother    Social History   Socioeconomic History   Marital status: Single    Spouse name: Not on file   Number of children: 0   Years of education: 12   Highest education level: High school graduate  Occupational History   Occupation: unemployed    Comment: disabled  Tobacco Use   Smoking status: Every Day    Packs/day: 0.25    Years: 15.00    Pack years: 3.75    Types: Cigarettes   Smokeless tobacco: Never   Tobacco comments:    quits off and on  Vaping Use   Vaping Use: Never used  Substance and Sexual Activity   Alcohol use: Yes    Alcohol/week: 2.0 standard drinks    Types: 2 Cans of beer per week   Drug use: Yes    Types: Marijuana   Sexual activity: Not Currently    Birth control/protection: None  Other Topics Concern   Not on file  Social History Narrative   Lives with his brother. No vehicle - rides his bike or walks to local appointments - Gets [facility] if appt out of town   Social Determinants of Radio broadcast assistant Strain: Low Risk    Difficulty of Paying Living Expenses: Not hard at all  Food Insecurity: No Food Insecurity   Worried About Charity fundraiser in the Last Year: Never true   [employer] of  Food in the Last Year: Never true  Transportation Needs: Unmet Transportation Needs   Lack of Transportation (Medical): Yes   Lack of Transportation (Non-Medical): No  Physical Activity: Insufficiently Active   Days of Exercise per Week: 7 days   Minutes of Exercise per Session: 20 min  Stress: No Stress Concern Present   Feeling of Stress : Not at all  Social Connections: Moderately Integrated   Frequency of Communication with Friends and Family: More than three times a week   Frequency of Social Gatherings with Friends and Family: More than three times a week   Attends Religious Services: More than 4 times per year   Active Member of [employer] or Organizations: Yes   Attends Music therapist: More than 4 times per  year   Marital Status: Never married    Tobacco Counseling Ready to quit: Not Answered Counseling given: Not Answered Tobacco comments: quits off and on   Clinical Intake:  Pre-visit preparation completed: Yes  Pain : No/denies pain     BMI - recorded: 27 Nutritional Status: BMI 25 -29 Overweight Nutritional Risks: None Diabetes: Yes CBG done?: No Did pt. bring in CBG monitor from home?: No  How often do you need to have someone help you when you read instructions, pamphlets, or other written materials from your doctor or pharmacy?: 1 - Never  Nutrition Risk Assessment:  Has the patient had any N/V/D within the last 2 months?  No  Does the patient have any non-healing wounds?  No  Has the patient had any unintentional weight loss or weight gain?  No   Diabetes:  Is the patient diabetic?  Yes  If diabetic, was a CBG obtained today?  No  Did the patient bring in their glucometer from home?  No  How often do you monitor your CBG's? daily.   Financial Strains and Diabetes Management:  Are you having any financial strains with the device, your supplies or your medication? No .  Does the patient want to be seen by Chronic Care Management for management of their diabetes?  No  Would the patient like to be referred to a Nutritionist or for Diabetic Management?  No   Diabetic Exams:  Diabetic Eye Exam: Completed 09/2020.  Diabetic Foot Exam: Completed 09/16/2019. Pt has been advised about the importance in completing this exam. Pt is scheduled for diabetic foot exam on 09/14/2020.    Interpreter Needed?: No  Information entered by :: Hephzibah Strehle, LPN   Activities of Daily Living In your present state of health, do you have any difficulty performing the following activities: 09/10/2020  Hearing? N  Vision? N  Difficulty concentrating or making decisions? N  Walking or climbing stairs? N  Dressing or bathing? N  Doing errands, shopping? N  Preparing Food and eating ?  N  Using the Toilet? N  In the past six months, have you accidently leaked urine? N  Do you have problems with loss of bowel control? N  Managing your Medications? N  Managing your Finances? N  Housekeeping or managing your Housekeeping? N  Some recent data might be hidden    Patient Care Team: Sharion Balloon, FNP as PCP - General (Family Medicine) Ilean China, RN as Registered Nurse Abbey Chatters, Elon Alas, DO as Consulting Physician (Internal Medicine) Harlen Labs, MD as Referring Physician (Optometry)  Indicate any recent Medical Services you may have received from other than [facility] providers in the past year (date may  be approximate).     Assessment:   This is a routine wellness examination for Delane.  Hearing/Vision screen Hearing Screening - Comments:: Denies hearing difficulties  Vision Screening - Comments:: Mild vision impairment - recent new rx for eyeglasses - Up to date with annual eye exam with Dr Marin Comment in [place]  Dietary issues and exercise activities discussed: Current Exercise Habits: Home exercise routine, Type of exercise: walking, Time (Minutes): 20, Frequency (Times/Week): 7, Weekly Exercise (Minutes/Week): 140, Intensity: Mild, Exercise limited by: orthopedic condition(s)   Goals Addressed             This Visit's Progress    DIET - INCREASE WATER INTAKE   On track    DIET - REDUCE FAT INTAKE   On track      Depression Screen PHQ 2/9 Scores 09/10/2020 06/11/2020 12/12/2019 09/16/2019 08/20/2019 06/18/2019 03/10/2019  PHQ - 2 Score 1 0 0 0 3 0 0  PHQ- 9 Score - - - - 4 - -    Fall Risk Fall Risk  09/10/2020 12/12/2019 09/16/2019 08/20/2019 06/18/2019  Falls in the past year? 0 0 0 0 0  Number falls in past yr: 0 - - 0 -  Injury with Fall? 0 - - 0 -  Risk for fall due to : Impaired vision;Orthopedic patient - - - -  Follow up Falls prevention discussed - - - -    FALL RISK PREVENTION PERTAINING TO THE HOME:  Any stairs in or around the home? No  If  so, are there any without handrails? No  Home free of loose throw rugs in walkways, pet beds, electrical cords, etc? Yes  Adequate lighting in your home to reduce risk of falls? Yes   ASSISTIVE DEVICES UTILIZED TO PREVENT FALLS:  Life alert? No  Use of a cane, walker or w/c? No  Grab bars in the bathroom? No  Shower chair or bench in shower? No  Elevated toilet seat or a handicapped toilet? No   TIMED UP AND GO:  Was the test performed? No . Telephonic visit  Cognitive Function: MMSE - Mini Mental State Exam 03/13/2018 02/26/2017  Not completed: - Unable to complete  Orientation to time 5 -  Orientation to Place 5 -  Registration 3 -  Attention/ Calculation 5 -  Recall 3 -  Language- name 2 objects 2 -  Language- repeat 1 -  Language- follow 3 step command 3 -  Language- read & follow direction 1 -  Write a sentence 1 -  Copy design 1 -  Total score 30 -     6CIT Screen 09/10/2020 06/18/2019  What Year? 0 points 0 points  What month? 0 points 0 points  What time? 0 points 0 points  Count back from 20 0 points 0 points  Months in reverse 0 points 0 points  Repeat phrase 0 points 0 points  Total Score 0 0    Immunizations Immunization History  Administered Date(s) Administered   Influenza,inj,Quad PF,6+ Mos 01/29/2017, 02/19/2018, 01/07/2019   Influenza,trivalent, recombinat, inj, PF 04/17/2015   Moderna Sars-Covid-2 Vaccination 07/10/2019, 08/07/2019   Pneumococcal Polysaccharide-23 09/16/2019   Tdap 10/22/2006, 02/19/2015    TDAP status: Up to date  Flu Vaccine status: Up to date  Pneumococcal vaccine status: Up to date  Covid-19 vaccine status: Completed vaccines  Qualifies for Shingles Vaccine? No   Zostavax completed No   Shingrix Completed?: No.    Education has been provided regarding the importance of this vaccine. Patient  has been advised to call insurance company to determine out of pocket expense if they have not yet received this vaccine. Advised  may also receive vaccine at local pharmacy or Health Dept. Verbalized acceptance and understanding.  Screening Tests Health Maintenance  Topic Date Due   Pneumococcal Vaccine 54-67 Years old (1 - PCV) Never done   OPHTHALMOLOGY EXAM  Never done   COVID-19 Vaccine (3 - Booster for Moderna series) 01/07/2020   COLONOSCOPY (Pts 45-71yr Insurance coverage will need to be confirmed)  06/11/2021 (Originally 04/16/2019)   FOOT EXAM  09/15/2020   INFLUENZA VACCINE  11/01/2020   HEMOGLOBIN A1C  12/12/2020   URINE MICROALBUMIN  06/11/2021   Zoster Vaccines- Shingrix (1 of 2) 04/15/2024   TETANUS/TDAP  02/18/2025   PNEUMOCOCCAL POLYSACCHARIDE VACCINE AGE 74-64 HIGH RISK  Completed   Hepatitis C Screening  Completed   HIV Screening  Completed   HPV VACCINES  Aged Out    Health Maintenance  Health Maintenance Due  Topic Date Due   Pneumococcal Vaccine 053668Years old (1 - PCV) Never done   OPHTHALMOLOGY EXAM  Never done   COVID-19 Vaccine (3 - Booster for Moderna series) 01/07/2020    Colorectal cancer screening: No longer required.   Lung Cancer Screening: (Low Dose CT Chest recommended if Age 675-80years, 30 pack-year currently smoking OR have quit w/in 15years.) does not qualify.   Additional Screening:  Hepatitis C Screening: does not qualify  Vision Screening: Recommended annual ophthalmology exams for early detection of glaucoma and other disorders of the eye. Is the patient up to date with their annual eye exam?  Yes  Who is the provider or what is the name of the office in which the patient attends annual eye exams? Dr LMarin CommentIf pt is not established with a provider, would they like to be referred to a provider to establish care? No .   Dental Screening: Recommended annual dental exams for proper oral hygiene  Community Resource Referral / Chronic Care Management: CRR required this visit?  No   CCM required this visit?  No      Plan:     I have personally reviewed and noted  the following in the patient's chart:   Medical and social history Use of alcohol, tobacco or illicit drugs  Current medications and supplements including opioid prescriptions. Patient is not currently taking opioid prescriptions. Functional ability and status Nutritional status Physical activity Advanced directives List of other physicians Hospitalizations, surgeries, and ER visits in previous 12 months Vitals Screenings to include cognitive, depression, and falls Referrals and appointments  In addition, I have reviewed and discussed with patient certain preventive protocols, quality metrics, and best practice recommendations. A written personalized care plan for preventive services as well as general preventive health recommendations were provided to patient.     ASandrea Hammond LPN   [id number]  Nurse Notes: None

## 2020-09-14 ENCOUNTER — Ambulatory Visit: Payer: Medicare Other | Admitting: Family

## 2020-09-15 ENCOUNTER — Encounter: Payer: Self-pay | Admitting: Family

## 2020-10-07 ENCOUNTER — Other Ambulatory Visit: Payer: Self-pay

## 2020-10-07 ENCOUNTER — Other Ambulatory Visit: Payer: Medicare Other

## 2020-10-07 ENCOUNTER — Encounter: Payer: Self-pay | Admitting: Family

## 2020-10-07 ENCOUNTER — Ambulatory Visit (INDEPENDENT_AMBULATORY_CARE_PROVIDER_SITE_OTHER): Payer: Medicare Other | Admitting: Family

## 2020-10-07 DIAGNOSIS — E785 Hyperlipidemia, unspecified: Secondary | ICD-10-CM

## 2020-10-07 DIAGNOSIS — E1169 Type 2 diabetes mellitus with other specified complication: Secondary | ICD-10-CM

## 2020-10-07 DIAGNOSIS — E1100 Type 2 diabetes mellitus with hyperosmolarity without nonketotic hyperglycemic-hyperosmolar coma (NKHHC): Secondary | ICD-10-CM

## 2020-10-07 DIAGNOSIS — F17200 Nicotine dependence, unspecified, uncomplicated: Secondary | ICD-10-CM

## 2020-10-07 DIAGNOSIS — E559 Vitamin D deficiency, unspecified: Secondary | ICD-10-CM

## 2020-10-07 DIAGNOSIS — J4541 Moderate persistent asthma with (acute) exacerbation: Secondary | ICD-10-CM

## 2020-10-07 DIAGNOSIS — F1010 Alcohol abuse, uncomplicated: Secondary | ICD-10-CM

## 2020-10-07 DIAGNOSIS — E663 Overweight: Secondary | ICD-10-CM

## 2020-10-07 LAB — BAYER DCA HB A1C WAIVED: HB A1C (BAYER DCA - WAIVED): 5.8 % (ref <7.0)

## 2020-10-07 NOTE — Progress Notes (Signed)
Virtual Visit  Note Due to COVID-19 pandemic this visit was conducted virtually. This visit type was conducted due to national recommendations for restrictions regarding the COVID-19 Pandemic (e.g. social distancing, sheltering in place) in an effort to limit this patient's exposure and mitigate transmission in our community. All issues noted in this document were discussed and addressed.  A physical exam was not performed with this format.  I connected with Juliann Mule on 10/07/20 at 3:00 pm  by telephone and verified that I am speaking with the correct person using two identifiers. Loyal Holzheimer is currently located at car and friend is currently with him during visit. The provider, Evelina Dun, FNP is located in their office at time of visit.  I discussed the limitations, risks, security and privacy concerns of performing an evaluation and management service by telephone and the availability of in person appointments. I also discussed with the patient that there may be a patient responsible charge related to this service. The patient expressed understanding and agreed to proceed.  Mr. jacobey, gura are scheduled for a virtual visit with your provider today.    Just as we do with appointments in the office, we must obtain your consent to participate.  Your consent will be active for this visit and any virtual visit you may have with one of our providers in the next 365 days.    If you have a MyChart account, I can also send a copy of this consent to you electronically.  All virtual visits are billed to your insurance company just like a traditional visit in the office.  As this is a virtual visit, video technology does not allow for your provider to perform a traditional examination.  This may limit your provider's ability to fully assess your condition.  If your provider identifies any concerns that need to be evaluated in person or the need to arrange testing such as labs, EKG, etc,  we will make arrangements to do so.    Although advances in technology are sophisticated, we cannot ensure that it will always work on either your end or our end.  If the connection with a video visit is poor, we may have to switch to a telephone visit.  With either a video or telephone visit, we are not always able to ensure that we have a secure connection.   I need to obtain your verbal consent now.   Are you willing to proceed with your visit today?   Orange Hilligoss has provided verbal consent on 10/07/2020 for a virtual visit (video or telephone).   Evelina Dun, [place] 10/07/2020  3:05 PM     History and Present Illness:   Pt calls  the office today for chronic follow up. He reports he drinks 3-4 beers a day. Diabetes He presents for his follow-up diabetic visit. Pertinent negatives for diabetes include no blurred vision and no foot paresthesias. Symptoms are stable. Risk factors for coronary artery disease include diabetes mellitus, dyslipidemia, male sex, hypertension and sedentary lifestyle. He is following a generally unhealthy diet. His overall blood glucose range is 130-140 mg/dl. Eye exam is current.  Asthma He complains of cough and wheezing. There is no shortness of breath. This is a chronic problem. The current episode started more than 1 year ago. The problem occurs intermittently. The problem has been waxing and waning. His past medical history is significant for asthma.  Hyperlipidemia This is a chronic problem. The current episode started more than 1  year ago. Exacerbating diseases include obesity. Pertinent negatives include no shortness of breath. Current antihyperlipidemic treatment includes statins. The current treatment provides moderate improvement of lipids. Risk factors for coronary artery disease include dyslipidemia, diabetes mellitus, male sex, hypertension and a sedentary lifestyle.  Nicotine Dependence Presents for follow-up visit. His urge triggers include  company of smokers. The symptoms have been stable. He smokes 1 pack of cigarettes per day.     Review of Systems  Eyes:  Negative for blurred vision.  Respiratory:  Positive for cough and wheezing. Negative for shortness of breath.     Observations/Objective: No SOB or distress noted   Assessment and Plan: 1. Moderate persistent asthma with acute exacerbation - CMP14+EGFR - CBC with Differential/Platelet  2. Type 2 diabetes mellitus with hyperosmolarity without coma, without long-term current use of insulin (HCC) - Bayer DCA Hb A1c Waived - CMP14+EGFR - CBC with Differential/Platelet  3. Hyperlipidemia associated with type 2 diabetes mellitus (HCC) - CMP14+EGFR - CBC with Differential/Platelet  4. Vitamin D deficiency - CMP14+EGFR - CBC with Differential/Platelet  5. Overweight (BMI 25.0-29.9) - CMP14+EGFR - CBC with Differential/Platelet  6. Excessive drinking of alcohol - CMP14+EGFR - CBC with Differential/Platelet  7. Current smoker - CMP14+EGFR - CBC with Differential/Platelet     Labs pending Continue all medications  RTO in 3 months    I discussed the assessment and treatment plan with the patient. The patient was provided an opportunity to ask questions and all were answered. The patient agreed with the plan and demonstrated an understanding of the instructions.   The patient was advised to call back or seek an in-person evaluation if the symptoms worsen or if the condition fails to improve as anticipated.  The above assessment and management plan was discussed with the patient. The patient verbalized understanding of and has agreed to the management plan. Patient is aware to call the clinic if symptoms persist or worsen. Patient is aware when to return to the clinic for a follow-up visit. Patient educated on when it is appropriate to go to the emergency department.   Time call ended:  3:22 pm  I provided 22 minutes of  non face-to-face time during this  encounter.    Evelina Dun, FNP

## 2020-10-08 LAB — CBC WITH DIFFERENTIAL/PLATELET
Basophils Absolute: 0 10*3/uL (ref 0.0–0.2)
Basos: 1 %
EOS (ABSOLUTE): 0.2 10*3/uL (ref 0.0–0.4)
Eos: 3 %
Hematocrit: 40.5 % (ref 37.5–51.0)
Hemoglobin: 13.5 g/dL (ref 13.0–17.7)
Immature Grans (Abs): 0 10*3/uL (ref 0.0–0.1)
Immature Granulocytes: 0 %
Lymphocytes Absolute: 1.9 10*3/uL (ref 0.7–3.1)
Lymphs: 31 %
MCH: 29.9 pg (ref 26.6–33.0)
MCHC: 33.3 g/dL (ref 31.5–35.7)
MCV: 90 fL (ref 79–97)
Monocytes Absolute: 0.5 10*3/uL (ref 0.1–0.9)
Monocytes: 8 %
Neutrophils Absolute: 3.5 10*3/uL (ref 1.4–7.0)
Neutrophils: 57 %
Platelets: 257 10*3/uL (ref 150–450)
RBC: 4.52 x10E6/uL (ref 4.14–5.80)
RDW: 13 % (ref 11.6–15.4)
WBC: 6.1 10*3/uL (ref 3.4–10.8)

## 2020-10-08 LAB — CMP14+EGFR
ALT: 21 IU/L (ref 0–44)
AST: 25 IU/L (ref 0–40)
Albumin/Globulin Ratio: 1.8 (ref 1.2–2.2)
Albumin: 4.6 g/dL (ref 4.0–5.0)
Alkaline Phosphatase: 65 IU/L (ref 44–121)
BUN/Creatinine Ratio: 17 (ref 9–20)
BUN: 22 mg/dL (ref 6–24)
Bilirubin Total: 0.2 mg/dL (ref 0.0–1.2)
CO2: 24 mmol/L (ref 20–29)
Calcium: 9.9 mg/dL (ref 8.7–10.2)
Chloride: 103 mmol/L (ref 96–106)
Creatinine, Ser: 1.31 mg/dL — ABNORMAL HIGH (ref 0.76–1.27)
Globulin, Total: 2.6 g/dL (ref 1.5–4.5)
Glucose: 119 mg/dL — ABNORMAL HIGH (ref 65–99)
Potassium: 4.1 mmol/L (ref 3.5–5.2)
Sodium: 143 mmol/L (ref 134–144)
Total Protein: 7.2 g/dL (ref 6.0–8.5)
eGFR: 68 mL/min/{1.73_m2} (ref >59)

## 2020-10-11 ENCOUNTER — Telehealth: Payer: Self-pay | Admitting: Family

## 2020-12-10 ENCOUNTER — Other Ambulatory Visit: Payer: Self-pay | Admitting: Family

## 2020-12-10 DIAGNOSIS — J4540 Moderate persistent asthma, uncomplicated: Secondary | ICD-10-CM

## 2021-01-07 ENCOUNTER — Ambulatory Visit: Payer: Medicare Other | Admitting: Family

## 2021-01-14 ENCOUNTER — Other Ambulatory Visit: Payer: Self-pay

## 2021-01-14 ENCOUNTER — Encounter: Payer: Self-pay | Admitting: Family

## 2021-01-14 ENCOUNTER — Ambulatory Visit (INDEPENDENT_AMBULATORY_CARE_PROVIDER_SITE_OTHER): Payer: Medicare Other | Admitting: Family

## 2021-01-14 VITALS — BP 131/90 | HR 84 | Temp 97.8°F | Ht 75.0 in | Wt 195.4 lb

## 2021-01-14 DIAGNOSIS — Z0000 Encounter for general adult medical examination without abnormal findings: Secondary | ICD-10-CM | POA: Diagnosis not present

## 2021-01-14 DIAGNOSIS — E663 Overweight: Secondary | ICD-10-CM

## 2021-01-14 DIAGNOSIS — E1169 Type 2 diabetes mellitus with other specified complication: Secondary | ICD-10-CM

## 2021-01-14 DIAGNOSIS — Z23 Encounter for immunization: Secondary | ICD-10-CM

## 2021-01-14 DIAGNOSIS — F17200 Nicotine dependence, unspecified, uncomplicated: Secondary | ICD-10-CM

## 2021-01-14 DIAGNOSIS — Z0001 Encounter for general adult medical examination with abnormal findings: Secondary | ICD-10-CM | POA: Diagnosis not present

## 2021-01-14 DIAGNOSIS — E559 Vitamin D deficiency, unspecified: Secondary | ICD-10-CM

## 2021-01-14 DIAGNOSIS — F1010 Alcohol abuse, uncomplicated: Secondary | ICD-10-CM

## 2021-01-14 DIAGNOSIS — Z1211 Encounter for screening for malignant neoplasm of colon: Secondary | ICD-10-CM

## 2021-01-14 DIAGNOSIS — J4540 Moderate persistent asthma, uncomplicated: Secondary | ICD-10-CM

## 2021-01-14 DIAGNOSIS — E785 Hyperlipidemia, unspecified: Secondary | ICD-10-CM

## 2021-01-14 DIAGNOSIS — H6123 Impacted cerumen, bilateral: Secondary | ICD-10-CM

## 2021-01-14 MED ORDER — SYMBICORT 80-4.5 MCG/ACT IN AERO: 2.0000 | INHALATION_SPRAY | Freq: Two times a day (BID) | RESPIRATORY_TRACT | 1 refills | Status: DC

## 2021-01-14 NOTE — Progress Notes (Signed)
 Subjective:    Patient ID: Casey Daniels, male    DOB: 07/12/1975, 45 y.o.   MRN: [id number]  Chief Complaint  Patient presents with   Medical Management of Chronic Issues   Pt presents to the office today for chronic follow up.  He reports he drinks 1-2 beers a day. Diabetes He presents for his follow-up diabetic visit. He has type 2 diabetes mellitus. Pertinent negatives for diabetes include no blurred vision and no foot paresthesias. Symptoms are stable. Pertinent negatives for diabetic complications include no heart disease. Risk factors for coronary artery disease include dyslipidemia, diabetes mellitus, male sex, hypertension and sedentary lifestyle. He is following a generally unhealthy diet. His overall blood glucose range is 90-110 mg/dl.  Asthma There is no cough or wheezing. This is a chronic problem. The current episode started more than 1 year ago. The problem occurs intermittently. His symptoms are alleviated by rest. His past medical history is significant for asthma.  Hyperlipidemia This is a chronic problem. The current episode started more than 1 year ago. He has no history of obesity. Current antihyperlipidemic treatment includes statins. The current treatment provides moderate improvement of lipids. Risk factors for coronary artery disease include dyslipidemia, diabetes mellitus, hypertension, male sex and a sedentary lifestyle.  Nicotine Dependence Presents for follow-up visit. His urge triggers include company of smokers. The symptoms have been stable. He smokes < 1/2 a pack of cigarettes per day.     Review of Systems  Eyes:  Negative for blurred vision.  Respiratory:  Negative for cough and wheezing.   All other systems reviewed and are negative.     Objective:   Physical Exam Vitals reviewed.  Constitutional:      General: He is not in acute distress.    Appearance: He is well-developed.  HENT:     Head: Normocephalic.     Right Ear: There is  impacted cerumen.     Left Ear: There is impacted cerumen.  Eyes:     General:        Right eye: No discharge.        Left eye: No discharge.     Pupils: Pupils are equal, round, and reactive to light.  Neck:     Thyroid: No thyromegaly.  Cardiovascular:     Rate and Rhythm: Normal rate and regular rhythm.     Heart sounds: No murmur heard.   Gallop present.  Pulmonary:     Effort: Pulmonary effort is normal. No respiratory distress.     Breath sounds: Normal breath sounds. No wheezing.  Abdominal:     General: Bowel sounds are normal. There is no distension.     Palpations: Abdomen is soft.     Tenderness: There is no abdominal tenderness.  Musculoskeletal:        General: No tenderness. Normal range of motion.     Cervical back: Normal range of motion and neck supple.  Skin:    General: Skin is warm and dry.     Findings: No erythema or rash.  Neurological:     Mental Status: He is alert and oriented to person, place, and time.     Cranial Nerves: No cranial nerve deficit.     Deep Tendon Reflexes: Reflexes are normal and symmetric.  Psychiatric:        Behavior: Behavior normal.        Thought Content: Thought content normal.        Judgment: Judgment normal.     Bilateral ears washed with warm water and peroxide. TM WNL.   Diabetic Foot Exam - Simple   Simple Foot Form Diabetic Foot exam was performed with the following findings: Yes 01/14/2021 12:18 PM  Visual Inspection See comments: Yes Sensation Testing See comments: Yes Pulse Check Posterior Tibialis and Dorsalis pulse intact bilaterally: Yes Comments Toenails thick and long, callus on great toe. Negative monofilament in toes.      BP 131/90   Pulse 84   Temp 97.8 F (36.6 C) (Temporal)   Ht 6' 3" (1.905 m)   Wt 195 lb 6.4 oz (88.6 kg)   BMI 24.42 kg/m      Assessment & Plan:  Kerrion Kemppainen comes in today with chief complaint of Medical Management of Chronic Issues   Diagnosis and orders  addressed:  1. Moderate persistent asthma without complication - SYMBICORT 80-4.5 MCG/ACT inhaler; Inhale 2 puffs into the lungs in the morning and at bedtime.  Dispense: 10.2 g; Refill: 1 - CMP14+EGFR  2. Need for immunization against influenza - Flu Vaccine QUAD 5moIM (Fluarix, Fluzone & Alfiuria Quad PF) - CMP14+EGFR  3. Annual physical exam - CMP14+EGFR  4. Type 2 diabetes mellitus with other specified complication, without long-term current use of insulin (HCC) - CMP14+EGFR  5. Hyperlipidemia associated with type 2 diabetes mellitus (HCC) - CMP14+EGFR  6. Overweight (BMI 25.0-29.9) - CMP14+EGFR  7. Current smoker - CMP14+EGFR  8. Excessive drinking of alcohol - CMP14+EGFR  9. Vitamin D deficiency - CMP14+EGFR  10. Bilateral impacted cerumen - CMP14+EGFR  11. Colon cancer screening - Ambulatory referral to Gastroenterology - CMP14+EGFR   Labs pending Health Maintenance reviewed Diet and exercise encouraged  Follow up plan: 6 months    CEvelina Dun FNP

## 2021-01-14 NOTE — Patient Instructions (Signed)
Health Maintenance, Male Adopting a healthy lifestyle and getting preventive care are important in promoting health and wellness. Ask your health care provider about: The right schedule for you to have regular tests and exams. Things you can do on your own to prevent diseases and keep yourself healthy. What should I know about diet, weight, and exercise? Eat a healthy diet  Eat a diet that includes plenty of vegetables, fruits, low-fat dairy products, and lean protein. Do not eat a lot of foods that are high in solid fats, added sugars, or sodium. Maintain a healthy weight Body mass index (BMI) is a measurement that can be used to identify possible weight problems. It estimates body fat based on height and weight. Your health care provider can help determine your BMI and help you achieve or maintain a healthy weight. Get regular exercise Get regular exercise. This is one of the most important things you can do for your health. Most adults should: Exercise for at least 150 minutes each week. The exercise should increase your heart rate and make you sweat (moderate-intensity exercise). Do strengthening exercises at least twice a week. This is in addition to the moderate-intensity exercise. Spend less time sitting. Even light physical activity can be beneficial. Watch cholesterol and blood lipids Have your blood tested for lipids and cholesterol at 46 years of age, then have this test every 5 years. You may need to have your cholesterol levels checked more often if: Your lipid or cholesterol levels are high. You are older than 46 years of age. You are at high risk for heart disease. What should I know about cancer screening? Many types of cancers can be detected early and may often be prevented. Depending on your health history and family history, you may need to have cancer screening at various ages. This may include screening for: Colorectal cancer. Prostate cancer. Skin cancer. Lung  cancer. What should I know about heart disease, diabetes, and high blood pressure? Blood pressure and heart disease High blood pressure causes heart disease and increases the risk of stroke. This is more likely to develop in people who have high blood pressure readings, are of African descent, or are overweight. Talk with your health care provider about your target blood pressure readings. Have your blood pressure checked: Every 3-5 years if you are 18-39 years of age. Every year if you are 40 years old or older. If you are between the ages of 65 and 75 and are a current or former smoker, ask your health care provider if you should have a one-time screening for abdominal aortic aneurysm (AAA). Diabetes Have regular diabetes screenings. This checks your fasting blood sugar level. Have the screening done: Once every three years after age 45 if you are at a normal weight and have a low risk for diabetes. More often and at a younger age if you are overweight or have a high risk for diabetes. What should I know about preventing infection? Hepatitis B If you have a higher risk for hepatitis B, you should be screened for this virus. Talk with your health care provider to find out if you are at risk for hepatitis B infection. Hepatitis C Blood testing is recommended for: Everyone born from 1945 through 1965. Anyone with known risk factors for hepatitis C. Sexually transmitted infections (STIs) You should be screened each year for STIs, including gonorrhea and chlamydia, if: You are sexually active and are younger than 46 years of age. You are older than 46 years   of age and your health care provider tells you that you are at risk for this type of infection. Your sexual activity has changed since you were last screened, and you are at increased risk for chlamydia or gonorrhea. Ask your health care provider if you are at risk. Ask your health care provider about whether you are at high risk for HIV.  Your health care provider may recommend a prescription medicine to help prevent HIV infection. If you choose to take medicine to prevent HIV, you should first get tested for HIV. You should then be tested every 3 months for as long as you are taking the medicine. Follow these instructions at home: Lifestyle Do not use any products that contain nicotine or tobacco, such as cigarettes, e-cigarettes, and chewing tobacco. If you need help quitting, ask your health care provider. Do not use street drugs. Do not share needles. Ask your health care provider for help if you need support or information about quitting drugs. Alcohol use Do not drink alcohol if your health care provider tells you not to drink. If you drink alcohol: Limit how much you have to 0-2 drinks a day. Be aware of how much alcohol is in your drink. In the [place] one drink equals one 12 oz bottle of beer (355 mL), one 5 oz glass of wine (148 mL), or one 1 oz glass of hard liquor (44 mL). General instructions Schedule regular health, dental, and eye exams. Stay current with your vaccines. Tell your health care provider if: You often feel depressed. You have ever been abused or do not feel safe at home. Summary Adopting a healthy lifestyle and getting preventive care are important in promoting health and wellness. Follow your health care provider's instructions about healthy diet, exercising, and getting tested or screened for diseases. Follow your health care provider's instructions on monitoring your cholesterol and blood pressure. This information is not intended to replace advice given to you by your health care provider. Make sure you discuss any questions you have with your health care provider. Document Revised: 05/28/2020 Document Reviewed: 03/13/2018 Elsevier Patient Education  2022 Elsevier Inc.  

## 2021-01-15 LAB — CMP14+EGFR
ALT: 24 IU/L (ref 0–44)
AST: 23 IU/L (ref 0–40)
Albumin/Globulin Ratio: 1.7 (ref 1.2–2.2)
Albumin: 4.8 g/dL (ref 4.0–5.0)
Alkaline Phosphatase: 65 IU/L (ref 44–121)
BUN/Creatinine Ratio: 9 (ref 9–20)
BUN: 11 mg/dL (ref 6–24)
Bilirubin Total: 0.4 mg/dL (ref 0.0–1.2)
CO2: 26 mmol/L (ref 20–29)
Calcium: 10.3 mg/dL — ABNORMAL HIGH (ref 8.7–10.2)
Chloride: 103 mmol/L (ref 96–106)
Creatinine, Ser: 1.18 mg/dL (ref 0.76–1.27)
Globulin, Total: 2.8 g/dL (ref 1.5–4.5)
Glucose: 78 mg/dL (ref 70–99)
Potassium: 4.7 mmol/L (ref 3.5–5.2)
Sodium: 141 mmol/L (ref 134–144)
Total Protein: 7.6 g/dL (ref 6.0–8.5)
eGFR: 77 mL/min/{1.73_m2} (ref >59)

## 2021-01-19 ENCOUNTER — Encounter: Payer: Self-pay | Admitting: Internal Medicine

## 2021-02-25 ENCOUNTER — Other Ambulatory Visit: Payer: Self-pay | Admitting: Family

## 2021-02-25 DIAGNOSIS — E1169 Type 2 diabetes mellitus with other specified complication: Secondary | ICD-10-CM

## 2021-02-25 DIAGNOSIS — E785 Hyperlipidemia, unspecified: Secondary | ICD-10-CM

## 2021-02-25 DIAGNOSIS — E1100 Type 2 diabetes mellitus with hyperosmolarity without nonketotic hyperglycemic-hyperosmolar coma (NKHHC): Secondary | ICD-10-CM

## 2021-05-12 ENCOUNTER — Encounter: Payer: Self-pay | Admitting: *Deleted

## 2021-06-09 NOTE — Progress Notes (Deleted)
? ?Referring Provider: Sharion Balloon, FNP ?Primary Care Physician:  Sharion Balloon, FNP ?Primary Gastroenterologist:  Dr. Abbey Chatters ? ?No chief complaint on file. ? ? ?HPI:   ?Casey Daniels is a 46 y.o. male presenting today at the request of Sharion Balloon, FNP for colon cancer, OV due to history of alcohol and drug use.  ? ?Past Medical History:  ?Diagnosis Date  ? Arthritis   ? bilateral ankles due to club foot release as an infant  ? Asthma   ? Diabetes mellitus without complication ([place])   ? Hyperlipidemia   ? ? ?Past Surgical History:  ?Procedure Laterality Date  ? CLUB FOOT RELEASE Bilateral   ? INCISION AND DRAINAGE ABSCESS N/A 03/12/2019  ? Procedure: INCISION AND DRAINAGE NECK ABSCESS;  Surgeon: Leta Baptist, MD;  Location: [facility];  Service: ENT;  Laterality: N/A;  ? ? ?Current Outpatient Medications  ?Medication Sig Dispense Refill  ? Accu-Chek Softclix Lancets lancets Check BS twice daily and as needed Dx E11.9 200 each 3  ? atorvastatin (LIPITOR) 20 MG tablet TAKE 1 TABLET DAILY 90 tablet 1  ? blood glucose meter kit and supplies Check BS BID and PRN (dx E11.9) 1 each 0  ? glucose blood (GLUCOSE METER TEST) test strip Use BID 100 each 12  ? metFORMIN (GLUCOPHAGE-XR) 750 MG 24 hr tablet TAKE (1) TABLET DAILY WITH BREAKFAST. 90 tablet 1  ? SYMBICORT 80-4.5 MCG/ACT inhaler Inhale 2 puffs into the lungs in the morning and at bedtime. 10.2 g 1  ? ?No current facility-administered medications for this visit.  ? ? ?Allergies as of 06/10/2021 - Review Complete 01/14/2021  ?Allergen Reaction Noted  ? Bee venom Swelling 11/18/2012  ? ? ?Family History  ?Problem Relation Age of Onset  ? Diabetes Mother   ? Healthy Brother   ? ? ?Social History  ? ?Socioeconomic History  ? Marital status: Single  ?  Spouse name: Not on file  ? Number of children: 0  ? Years of education: 34  ? Highest education level: High school graduate  ?Occupational History  ? Occupation: unemployed  ?  Comment: disabled  ?Tobacco Use  ?  Smoking status: Every Day  ?  Packs/day: 0.25  ?  Years: 15.00  ?  Pack years: 3.75  ?  Types: Cigarettes  ? Smokeless tobacco: Never  ? Tobacco comments:  ?  quits off and on  ?Vaping Use  ? Vaping Use: Never used  ?Substance and Sexual Activity  ? Alcohol use: Yes  ?  Alcohol/week: 2.0 standard drinks  ?  Types: 2 Cans of beer per week  ? Drug use: Yes  ?  Types: Marijuana  ? Sexual activity: Not Currently  ?  Birth control/protection: None  ?Other Topics Concern  ? Not on file  ?Social History Narrative  ? Lives with his brother. No vehicle - rides his bike or walks to local appointments - Gets [facility] if appt out of town  ? ?Social Determinants of Health  ? ?Financial Resource Strain: Low Risk   ? Difficulty of Paying Living Expenses: Not hard at all  ?Food Insecurity: No Food Insecurity  ? Worried About Charity fundraiser in the Last Year: Never true  ? Ran Out of Food in the Last Year: Never true  ?Transportation Needs: Unmet Transportation Needs  ? Lack of Transportation (Medical): Yes  ? Lack of Transportation (Non-Medical): No  ?Physical Activity: Insufficiently Active  ? Days of Exercise per Week:  7 days  ? Minutes of Exercise per Session: 20 min  ?Stress: No Stress Concern Present  ? Feeling of Stress : Not at all  ?Social Connections: Moderately Integrated  ? Frequency of Communication with Friends and Family: More than three times a week  ? Frequency of Social Gatherings with Friends and Family: More than three times a week  ? Attends Religious Services: More than 4 times per year  ? Active Member of Clubs or Organizations: Yes  ? Attends Archivist Meetings: More than 4 times per year  ? Marital Status: Never married  ?Intimate Partner Violence: Not At Risk  ? Fear of Current or Ex-Partner: No  ? Emotionally Abused: No  ? Physically Abused: No  ? Sexually Abused: No  ? ? ?Review of Systems: ?Gen: Denies any fever, chills, fatigue, weight loss, lack of appetite.  ?CV: Denies chest pain, heart  palpitations, peripheral edema, syncope.  ?Resp: Denies shortness of breath at rest or with exertion. Denies wheezing or cough.  ?GI: Denies dysphagia or odynophagia. Denies jaundice, hematemesis, fecal incontinence. ?GU : Denies urinary burning, urinary frequency, urinary hesitancy ?MS: Denies joint pain, muscle weakness, cramps, or limitation of movement.  ?Derm: Denies rash, itching, dry skin ?Psych: Denies depression, anxiety, memory loss, and confusion ?Heme: Denies bruising, bleeding, and enlarged lymph nodes. ? ?Physical Exam: ?There were no vitals taken for this visit. ?General:   Alert and oriented. Pleasant and cooperative. Well-nourished and well-developed.  ?Head:  Normocephalic and atraumatic. ?Eyes:  Without icterus, sclera clear and conjunctiva pink.  ?Ears:  Normal auditory acuity. ?Nose:  No deformity, discharge,  or lesions. ?Mouth:  No deformity or lesions, oral mucosa pink.  ?Neck:  Supple, without mass or thyromegaly. ?Lungs:  Clear to auscultation bilaterally. No wheezes, rales, or rhonchi. No distress.  ?Heart:  S1, S2 present without murmurs appreciated.  ?Abdomen:  +BS, soft, non-tender and non-distended. No HSM noted. No guarding or rebound. No masses appreciated.  ?Rectal:  Deferred  ?Msk:  Symmetrical without gross deformities. Normal posture. ?Pulses:  Normal pulses noted. ?Extremities:  Without clubbing or edema. ?Neurologic:  Alert and  oriented x4;  grossly normal neurologically. ?Skin:  Intact without significant lesions or rashes. ?Cervical Nodes:  No significant cervical adenopathy. ?Psych:  Alert and cooperative. Normal mood and affect. ? ?

## 2021-06-10 ENCOUNTER — Encounter: Payer: Self-pay | Admitting: Internal Medicine

## 2021-06-10 ENCOUNTER — Ambulatory Visit: Payer: Medicare Other | Admitting: Gastroenterology

## 2021-07-07 ENCOUNTER — Ambulatory Visit: Payer: Medicare Other | Admitting: Family

## 2021-07-07 ENCOUNTER — Encounter: Payer: Self-pay | Admitting: Family

## 2021-07-11 ENCOUNTER — Ambulatory Visit: Payer: Medicare Other | Admitting: Family

## 2021-08-02 ENCOUNTER — Ambulatory Visit (INDEPENDENT_AMBULATORY_CARE_PROVIDER_SITE_OTHER): Payer: Medicare Other | Admitting: Family

## 2021-08-02 ENCOUNTER — Encounter: Payer: Self-pay | Admitting: Family

## 2021-08-02 VITALS — BP 139/83 | HR 78 | Temp 98.1°F | Resp 16 | Ht 75.0 in | Wt 203.4 lb

## 2021-08-02 DIAGNOSIS — F17200 Nicotine dependence, unspecified, uncomplicated: Secondary | ICD-10-CM | POA: Diagnosis not present

## 2021-08-02 DIAGNOSIS — E663 Overweight: Secondary | ICD-10-CM

## 2021-08-02 DIAGNOSIS — J4541 Moderate persistent asthma with (acute) exacerbation: Secondary | ICD-10-CM | POA: Diagnosis not present

## 2021-08-02 DIAGNOSIS — E1169 Type 2 diabetes mellitus with other specified complication: Secondary | ICD-10-CM | POA: Diagnosis not present

## 2021-08-02 DIAGNOSIS — F1010 Alcohol abuse, uncomplicated: Secondary | ICD-10-CM

## 2021-08-02 DIAGNOSIS — E559 Vitamin D deficiency, unspecified: Secondary | ICD-10-CM

## 2021-08-02 DIAGNOSIS — E785 Hyperlipidemia, unspecified: Secondary | ICD-10-CM

## 2021-08-02 LAB — BAYER DCA HB A1C WAIVED: HB A1C (BAYER DCA - WAIVED): 5.4 % (ref 4.8–5.6)

## 2021-08-02 NOTE — Patient Instructions (Signed)
Health Maintenance, Male Adopting a healthy lifestyle and getting preventive care are important in promoting health and wellness. Ask your health care provider about: The right schedule for you to have regular tests and exams. Things you can do on your own to prevent diseases and keep yourself healthy. What should I know about diet, weight, and exercise? Eat a healthy diet  Eat a diet that includes plenty of vegetables, fruits, low-fat dairy products, and lean protein. Do not eat a lot of foods that are high in solid fats, added sugars, or sodium. Maintain a healthy weight Body mass index (BMI) is a measurement that can be used to identify possible weight problems. It estimates body fat based on height and weight. Your health care provider can help determine your BMI and help you achieve or maintain a healthy weight. Get regular exercise Get regular exercise. This is one of the most important things you can do for your health. Most adults should: Exercise for at least 150 minutes each week. The exercise should increase your heart rate and make you sweat (moderate-intensity exercise). Do strengthening exercises at least twice a week. This is in addition to the moderate-intensity exercise. Spend less time sitting. Even light physical activity can be beneficial. Watch cholesterol and blood lipids Have your blood tested for lipids and cholesterol at 46 years of age, then have this test every 5 years. You may need to have your cholesterol levels checked more often if: Your lipid or cholesterol levels are high. You are older than 47 years of age. You are at high risk for heart disease. What should I know about cancer screening? Many types of cancers can be detected early and may often be prevented. Depending on your health history and family history, you may need to have cancer screening at various ages. This may include screening for: Colorectal cancer. Prostate cancer. Skin cancer. Lung  cancer. What should I know about heart disease, diabetes, and high blood pressure? Blood pressure and heart disease High blood pressure causes heart disease and increases the risk of stroke. This is more likely to develop in people who have high blood pressure readings or are overweight. Talk with your health care provider about your target blood pressure readings. Have your blood pressure checked: Every 3-5 years if you are 18-39 years of age. Every year if you are 40 years old or older. If you are between the ages of 65 and 75 and are a current or former smoker, ask your health care provider if you should have a one-time screening for abdominal aortic aneurysm (AAA). Diabetes Have regular diabetes screenings. This checks your fasting blood sugar level. Have the screening done: Once every three years after age 45 if you are at a normal weight and have a low risk for diabetes. More often and at a younger age if you are overweight or have a high risk for diabetes. What should I know about preventing infection? Hepatitis B If you have a higher risk for hepatitis B, you should be screened for this virus. Talk with your health care provider to find out if you are at risk for hepatitis B infection. Hepatitis C Blood testing is recommended for: Everyone born from 1945 through 1965. Anyone with known risk factors for hepatitis C. Sexually transmitted infections (STIs) You should be screened each year for STIs, including gonorrhea and chlamydia, if: You are sexually active and are younger than 47 years of age. You are older than 47 years of age and your   health care provider tells you that you are at risk for this type of infection. Your sexual activity has changed since you were last screened, and you are at increased risk for chlamydia or gonorrhea. Ask your health care provider if you are at risk. Ask your health care provider about whether you are at high risk for HIV. Your health care provider  may recommend a prescription medicine to help prevent HIV infection. If you choose to take medicine to prevent HIV, you should first get tested for HIV. You should then be tested every 3 months for as long as you are taking the medicine. Follow these instructions at home: Alcohol use Do not drink alcohol if your health care provider tells you not to drink. If you drink alcohol: Limit how much you have to 0-2 drinks a day. Know how much alcohol is in your drink. In the [place] one drink equals one 12 oz bottle of beer (355 mL), one 5 oz glass of wine (148 mL), or one 1 oz glass of hard liquor (44 mL). Lifestyle Do not use any products that contain nicotine or tobacco. These products include cigarettes, chewing tobacco, and vaping devices, such as e-cigarettes. If you need help quitting, ask your health care provider. Do not use street drugs. Do not share needles. Ask your health care provider for help if you need support or information about quitting drugs. General instructions Schedule regular health, dental, and eye exams. Stay current with your vaccines. Tell your health care provider if: You often feel depressed. You have ever been abused or do not feel safe at home. Summary Adopting a healthy lifestyle and getting preventive care are important in promoting health and wellness. Follow your health care provider's instructions about healthy diet, exercising, and getting tested or screened for diseases. Follow your health care provider's instructions on monitoring your cholesterol and blood pressure. This information is not intended to replace advice given to you by your health care provider. Make sure you discuss any questions you have with your health care provider. Document Revised: 08/09/2020 Document Reviewed: 08/09/2020 Elsevier Patient Education  2023 Elsevier Inc.  

## 2021-08-02 NOTE — Progress Notes (Signed)
? ?Subjective:  ? ? Patient ID: Casey Daniels, male    DOB: 1975-11-17, 46 y.o.   MRN: [id number] ? ?Pt presents to the office today for chronic follow up.  He reports he drinks 1-2 beers a day.  ?Asthma ?He complains of cough and sputum production. There is no wheezing. This is a chronic problem. The current episode started more than 1 year ago. The problem occurs intermittently. His symptoms are aggravated by pollen. His symptoms are alleviated by rest. He reports minimal improvement on treatment. His symptoms are not alleviated by rest. His past medical history is significant for asthma.  ?Hyperlipidemia ?This is a chronic problem. The current episode started more than 1 year ago. The problem is controlled. Recent lipid tests were reviewed and are normal. Exacerbating diseases include obesity. Factors aggravating his hyperlipidemia include smoking. Current antihyperlipidemic treatment includes statins. The current treatment provides moderate improvement of lipids. Risk factors for coronary artery disease include dyslipidemia, diabetes mellitus, male sex, hypertension and a sedentary lifestyle.  ?Diabetes ?He presents for his follow-up diabetic visit. He has type 2 diabetes mellitus. There are no hypoglycemic associated symptoms. Pertinent negatives for diabetes include no blurred vision and no foot paresthesias. Symptoms are stable. Diabetic complications include heart disease. Risk factors for coronary artery disease include dyslipidemia, diabetes mellitus, male sex, hypertension and sedentary lifestyle. He is following a generally unhealthy diet. His overall blood glucose range is 90-110 mg/dl. Eye exam is not current.  ?Nicotine Dependence ?Presents for follow-up visit. His urge triggers include company of smokers. The symptoms have been stable. He smokes 1 pack of cigarettes per day.  ? ? ? ?Review of Systems  ?Eyes:  Negative for blurred vision.  ?Respiratory:  Positive for cough and sputum production.  Negative for wheezing.   ?All other systems reviewed and are negative. ? ?   ?Objective:  ? Physical Exam ?Vitals reviewed.  ?Constitutional:   ?   General: He is not in acute distress. ?   Appearance: He is well-developed. He is obese.  ?HENT:  ?   Head: Normocephalic.  ?   Right Ear: Tympanic membrane normal.  ?   Left Ear: Tympanic membrane normal.  ?Eyes:  ?   General:     ?   Right eye: No discharge.     ?   Left eye: No discharge.  ?   Pupils: Pupils are equal, round, and reactive to light.  ?Neck:  ?   Thyroid: No thyromegaly.  ?Cardiovascular:  ?   Rate and Rhythm: Normal rate and regular rhythm.  ?   Heart sounds: Normal heart sounds. No murmur heard. ?Pulmonary:  ?   Effort: Pulmonary effort is normal. No respiratory distress.  ?   Breath sounds: Normal breath sounds. No wheezing.  ?Abdominal:  ?   General: Bowel sounds are normal. There is no distension.  ?   Palpations: Abdomen is soft.  ?   Tenderness: There is no abdominal tenderness.  ?Musculoskeletal:     ?   General: No tenderness. Normal range of motion.  ?   Cervical back: Normal range of motion and neck supple.  ?Skin: ?   General: Skin is warm and dry.  ?   Findings: No erythema or rash.  ?Neurological:  ?   Mental Status: He is alert and oriented to person, place, and time.  ?   Cranial Nerves: No cranial nerve deficit.  ?   Deep Tendon Reflexes: Reflexes are normal and symmetric.  ?Psychiatric:     ?  Behavior: Behavior normal.     ?   Thought Content: Thought content normal.     ?   Judgment: Judgment normal.  ? ? ? ? ?BP 139/83   Pulse 78   Temp 98.1 ?F (36.7 ?C)   Resp 16   Ht 6' 3"  (1.905 m)   Wt 203 lb 6.4 oz (92.3 kg)   SpO2 97%   BMI 25.42 kg/m?  ? ?   ?Assessment & Plan:  ?Clois Treanor comes in today with chief complaint of Medical Management of Chronic Issues ? ? ?Diagnosis and orders addressed: ? ?1. Moderate persistent asthma with acute exacerbation ?- CMP14+EGFR ?- CBC with Differential/Platelet ? ?2. Hyperlipidemia  associated with type 2 diabetes mellitus ([place]) ?- CMP14+EGFR ?- CBC with Differential/Platelet ? ?3. Type 2 diabetes mellitus with other specified complication, without long-term current use of insulin ([place]) ?- Bayer DCA Hb A1c Waived ?- CMP14+EGFR ?- CBC with Differential/Platelet ?- Microalbumin / creatinine urine ratio ? ?4. Current smoker ?- CMP14+EGFR ?- CBC with Differential/Platelet ? ?5. Excessive drinking of alcohol ?- CMP14+EGFR ?- CBC with Differential/Platelet ? ?6. Overweight (BMI 25.0-29.9) ?- CMP14+EGFR ?- CBC with Differential/Platelet ? ?7. Vitamin D deficiency ?- CMP14+EGFR ?- CBC with Differential/Platelet ? ? ?Labs pending ?Health Maintenance reviewed ?Diet and exercise encouraged ? ?Follow up plan: ?4 months  ? ? ?Evelina Dun, FNP ? ? ? ?

## 2021-08-03 ENCOUNTER — Other Ambulatory Visit: Payer: Self-pay | Admitting: Family

## 2021-08-03 LAB — CBC WITH DIFFERENTIAL/PLATELET
Basophils Absolute: 0 10*3/uL (ref 0.0–0.2)
Basos: 0 %
EOS (ABSOLUTE): 0.2 10*3/uL (ref 0.0–0.4)
Eos: 3 %
Hematocrit: 44.4 % (ref 37.5–51.0)
Hemoglobin: 15.2 g/dL (ref 13.0–17.7)
Immature Grans (Abs): 0 10*3/uL (ref 0.0–0.1)
Immature Granulocytes: 0 %
Lymphocytes Absolute: 1.8 10*3/uL (ref 0.7–3.1)
Lymphs: 35 %
MCH: 30.7 pg (ref 26.6–33.0)
MCHC: 34.2 g/dL (ref 31.5–35.7)
MCV: 90 fL (ref 79–97)
Monocytes Absolute: 0.4 10*3/uL (ref 0.1–0.9)
Monocytes: 7 %
Neutrophils Absolute: 2.8 10*3/uL (ref 1.4–7.0)
Neutrophils: 55 %
Platelets: 253 10*3/uL (ref 150–450)
RBC: 4.95 x10E6/uL (ref 4.14–5.80)
RDW: 13.1 % (ref 11.6–15.4)
WBC: 5.1 10*3/uL (ref 3.4–10.8)

## 2021-08-03 LAB — MICROALBUMIN / CREATININE URINE RATIO
Creatinine, Urine: 183.6 mg/dL
Microalb/Creat Ratio: 2 mg/g creat (ref 0–29)
Microalbumin, Urine: 3.2 ug/mL

## 2021-08-03 LAB — CMP14+EGFR
ALT: 21 IU/L (ref 0–44)
AST: 22 IU/L (ref 0–40)
Albumin/Globulin Ratio: 2 (ref 1.2–2.2)
Albumin: 4.6 g/dL (ref 4.0–5.0)
Alkaline Phosphatase: 59 IU/L (ref 44–121)
BUN/Creatinine Ratio: 11 (ref 9–20)
BUN: 13 mg/dL (ref 6–24)
Bilirubin Total: 0.3 mg/dL (ref 0.0–1.2)
CO2: 23 mmol/L (ref 20–29)
Calcium: 10.1 mg/dL (ref 8.7–10.2)
Chloride: 103 mmol/L (ref 96–106)
Creatinine, Ser: 1.16 mg/dL (ref 0.76–1.27)
Globulin, Total: 2.3 g/dL (ref 1.5–4.5)
Glucose: 103 mg/dL — ABNORMAL HIGH (ref 70–99)
Potassium: 4.7 mmol/L (ref 3.5–5.2)
Sodium: 141 mmol/L (ref 134–144)
Total Protein: 6.9 g/dL (ref 6.0–8.5)
eGFR: 78 mL/min/{1.73_m2} (ref >59)

## 2021-08-03 MED ORDER — METFORMIN HCL ER 500 MG PO TB24: 500.0000 mg | ORAL_TABLET | Freq: Every day | ORAL | 1 refills | Status: DC

## 2021-08-04 ENCOUNTER — Telehealth: Payer: Self-pay | Admitting: Family

## 2021-08-04 ENCOUNTER — Encounter: Payer: Self-pay | Admitting: Emergency Medicine

## 2021-08-08 NOTE — Telephone Encounter (Signed)
If he is asking if he can take something OTC, I am ok with this for short term. I will say a lot of those medications are not FDA regulated.  ?

## 2021-08-08 NOTE — Telephone Encounter (Signed)
Ph # not valid / working ?

## 2021-08-24 ENCOUNTER — Other Ambulatory Visit: Payer: Self-pay | Admitting: Family

## 2021-08-24 DIAGNOSIS — E1169 Type 2 diabetes mellitus with other specified complication: Secondary | ICD-10-CM

## 2021-08-24 DIAGNOSIS — J4540 Moderate persistent asthma, uncomplicated: Secondary | ICD-10-CM

## 2021-09-13 ENCOUNTER — Telehealth: Payer: Self-pay

## 2021-09-13 ENCOUNTER — Ambulatory Visit: Payer: Medicare Other

## 2021-09-13 NOTE — Telephone Encounter (Signed)
His mother doesn't know how to get in touch with him, but if she sees him will let him know. Please send letter asking him to update phone # and reschedule AWV. Thank you!

## 2021-10-19 ENCOUNTER — Ambulatory Visit (INDEPENDENT_AMBULATORY_CARE_PROVIDER_SITE_OTHER): Payer: Medicare Other | Admitting: Nurse Practitioner

## 2021-10-19 ENCOUNTER — Encounter: Payer: Self-pay | Admitting: Nurse Practitioner

## 2021-10-19 VITALS — BP 116/79 | HR 92 | Temp 97.0°F | Ht 75.0 in | Wt 197.6 lb

## 2021-10-19 DIAGNOSIS — B070 Plantar wart: Secondary | ICD-10-CM

## 2021-10-19 NOTE — Patient Instructions (Signed)
Warts  Warts are small growths on the skin. They are common, and they are caused by a virus. Warts can be found on many parts of the body. A person may have one wart or many warts. Most warts will go away on their own with time, but this could take many months to a few years. Treatments may be done if needed. What are the causes? Warts are caused by a type of virus that is called HPV. This virus can spread from person to person through touching. Warts can also spread to other parts of the body when a person scratches a wart and then scratches normal skin. What increases the risk? You are more likely to get warts if: You are 21-20 years old. You have a weak body defense system (immune system). You are Caucasian. What are the signs or symptoms? The main symptom of this condition is small growths on the skin. Warts may: Be round, oval, or have an uneven shape. Feel rough to the touch. Be the color of your skin or light yellow, brown, or gray. Often be less than  inch (1.3 cm) in size. Go away and then come back again. Most warts do not hurt, but some can hurt if they are large or if they are on the bottom of your feet. How is this diagnosed? A wart can often be diagnosed by how it looks. In some cases, the doctor might remove a little bit of the wart to test it (biopsy). How is this treated? Most of the time, warts do not need treatment. Sometimes people want warts removed. If treatment is needed or wanted, options may include: Putting creams or patches with medicine in them on the wart. Putting duct tape over the top of the wart. Freezing the wart. Burning the wart with: A laser. An electric probe. Giving a shot of medicine into the wart to help the body's defense system fight off the wart. Surgery to remove the wart. Follow these instructions at home:  Medicines Apply over-the-counter and prescription medicines only as told by your doctor. Do not apply over-the-counter wart  medicines to your face or genitals before you ask your doctor if it is okay to do that. Lifestyle Keep your body's defense system healthy. To do this: Eat a healthy diet. Get enough sleep. Do not use any products that contain nicotine or tobacco, such as cigarettes and e-cigarettes. If you need help quitting, ask your doctor. General instructions Wash your hands after you touch a wart. Do not scratch or pick at a wart. Avoid shaving hair that is over a wart. Keep all follow-up visits as told by your doctor. This is important. Contact a doctor if: Your warts do not get better after treatment. You have redness, swelling, or pain at the site of a wart. You have bleeding from a wart, and the bleeding does not stop when you put light pressure on the wart. You have diabetes and you get a wart. Summary Warts are small growths on the skin. They are common, and they are caused by a virus. Most of the time, warts do not need treatment. Sometimes people want warts removed. If treatment is needed or wanted, there are many options. Apply over-the-counter and prescription medicines only as told by your doctor. Wash your hands after you touch a wart. Keep all follow-up visits as told by your doctor. This is important. This information is not intended to replace advice given to you by your health care provider. Make  sure you discuss any questions you have with your health care provider. Document Revised: 01/03/2021 Document Reviewed: 01/03/2021 Elsevier Patient Education  [place].

## 2021-10-19 NOTE — Progress Notes (Signed)
   Acute Office Visit  Subjective:     Patient ID: Casey Daniels, male    DOB: 1975/05/16, 46 y.o.   MRN: [id number]  Chief Complaint  Patient presents with   Referral    Podiatrist     HPI Patient is in today with  complains of regrowth of multiple warts on the left sole of foot. present for a few  year(s).      Review of Systems  Constitutional: Negative.  Negative for chills and fever.  HENT: Negative.    Eyes: Negative.   Respiratory: Negative.    Cardiovascular: Negative.   Gastrointestinal: Negative.   Genitourinary: Negative.   Skin:  Negative for itching and rash.       Warts on left sole   All other systems reviewed and are negative.       Objective:    BP 116/79   Pulse 92   Temp (!) 97 F (36.1 C) (Temporal)   Ht '6\' 3"'$  (1.905 m)   Wt 197 lb 9.6 oz (89.6 kg)   SpO2 96%   BMI 24.70 kg/m  BP Readings from Last 3 Encounters:  10/19/21 116/79  08/02/21 139/83  01/14/21 131/90   Wt Readings from Last 3 Encounters:  10/19/21 197 lb 9.6 oz (89.6 kg)  08/02/21 203 lb 6.4 oz (92.3 kg)  01/14/21 195 lb 6.4 oz (88.6 kg)      Physical Exam Vitals and nursing note reviewed.  Constitutional:      Appearance: Normal appearance.  HENT:     Head: Normocephalic.     Right Ear: External ear normal.     Left Ear: External ear normal.     Nose: Nose normal.     Mouth/Throat:     Mouth: Mucous membranes are moist.     Pharynx: Oropharynx is clear.  Eyes:     Conjunctiva/sclera: Conjunctivae normal.  Cardiovascular:     Rate and Rhythm: Normal rate and regular rhythm.     Pulses: Normal pulses.     Heart sounds: Normal heart sounds.  Abdominal:     General: Bowel sounds are normal.  Skin:    General: Skin is warm.     Findings: No rash.     Comments: Wart on left sole of foot  Neurological:     General: No focal deficit present.     Mental Status: He is alert and oriented to person, place, and time.     No results found for any visits on  10/19/21.      Assessment & Plan:  This is not new for patient. Warts have regrown and are painful. Referral  to podiatry completed. Soak foot in warm water, ibuprofen or tylenol for pain as needed. Follow up with unresolved symptoms Problem List Items Addressed This Visit   None Visit Diagnoses     Plantar wart    -  Primary   Relevant Orders   Ambulatory referral to Podiatry       No orders of the defined types were placed in this encounter.   Return if symptoms worsen or fail to improve.  Ivy Lynn, NP

## 2021-10-24 ENCOUNTER — Ambulatory Visit: Payer: Self-pay | Admitting: *Deleted

## 2021-10-24 DIAGNOSIS — E1169 Type 2 diabetes mellitus with other specified complication: Secondary | ICD-10-CM

## 2021-10-24 DIAGNOSIS — J4541 Moderate persistent asthma with (acute) exacerbation: Secondary | ICD-10-CM

## 2021-10-24 NOTE — Patient Instructions (Signed)
Juliann Mule  I have previously worked with you through the Chronic Care Management Program at [facility]. Due to program changes I am removing myself from your care team because you've either met our goals, your conditions are stable and no longer require care management, or we haven't engaged within the past 6 months. If you are currently active with another CCM Team Member, you will remain active with them unless they reach out to you with additional information. If you feel that you need RN Care Management services in the future, please talk with your primary care provider to discuss re-engagement with the RN Care Manager that will be assigned to [facility]. This does not affect your status as a patient at [facility].   Thank you for allowing me to participate in your your healthcare journey.  Chong Sicilian, BSN, RN-BC Embedded Chronic Care Manager [facility] / [facility] Management Direct Dial: [phone]

## 2021-10-24 NOTE — Chronic Care Management (AMB) (Signed)
  Chronic Care Management   Note  10/24/2021 Name: Casey Daniels MRN: [id number] DOB: [birth year]   Patient has either met RN Care Management goals, is stable from [facility] Management perspective, or has not recently engaged with the RN Care Manager. I am removing RN Care Manager from Care Team and closing [facility]. If patient is currently engaged with another CCM team member I will forward this encounter to inform them of my case closure. Patient may be eligible for re-engagement with RN Care Manager in the future if necessary and can discuss this with their PCP.  Chong Sicilian, BSN, RN-BC Embedded Chronic Care Manager [facility] / [place] Management Direct Dial: [phone]

## 2021-11-03 ENCOUNTER — Encounter (INDEPENDENT_AMBULATORY_CARE_PROVIDER_SITE_OTHER): Payer: Self-pay | Admitting: Podiatry

## 2021-11-03 NOTE — Progress Notes (Signed)
NO SHOW. This encounter was created in error - please disregard.

## 2021-11-09 ENCOUNTER — Telehealth: Payer: Self-pay | Admitting: Family

## 2021-11-09 NOTE — Telephone Encounter (Signed)
Left message for patient to call back and schedule Medicare Annual Wellness Visit (AWV) to be completed by video or phone.   Last AWV: 09/10/2020   Please schedule at anytime with [facility] Nurse Health Advisor     45 minute appointment  Any questions, please contact me at [phone]     

## 2021-11-15 ENCOUNTER — Encounter (INDEPENDENT_AMBULATORY_CARE_PROVIDER_SITE_OTHER): Payer: Medicare Other | Admitting: Podiatry

## 2021-11-15 NOTE — Progress Notes (Signed)
NO SHOW. This encounter was created in error - please disregard.

## 2021-11-26 ENCOUNTER — Other Ambulatory Visit: Payer: Self-pay | Admitting: Family

## 2021-11-26 DIAGNOSIS — J4540 Moderate persistent asthma, uncomplicated: Secondary | ICD-10-CM

## 2021-11-30 ENCOUNTER — Ambulatory Visit (INDEPENDENT_AMBULATORY_CARE_PROVIDER_SITE_OTHER): Payer: Medicare Other

## 2021-11-30 ENCOUNTER — Encounter: Payer: Self-pay | Admitting: Nurse Practitioner

## 2021-11-30 ENCOUNTER — Ambulatory Visit (INDEPENDENT_AMBULATORY_CARE_PROVIDER_SITE_OTHER): Payer: Medicare Other | Admitting: Nurse Practitioner

## 2021-11-30 VITALS — BP 114/66 | HR 78 | Temp 98.7°F | Ht 75.0 in | Wt 196.0 lb

## 2021-11-30 DIAGNOSIS — M25561 Pain in right knee: Secondary | ICD-10-CM

## 2021-11-30 DIAGNOSIS — M7989 Other specified soft tissue disorders: Secondary | ICD-10-CM | POA: Diagnosis not present

## 2021-11-30 MED ORDER — IBUPROFEN 600 MG PO TABS: 600.0000 mg | ORAL_TABLET | Freq: Three times a day (TID) | ORAL | 0 refills | Status: DC | PRN

## 2021-11-30 NOTE — Patient Instructions (Signed)
Acute Knee Pain, Adult Many things can cause knee pain. Sometimes, knee pain is sudden (acute) and may be caused by damage, swelling, or irritation of the muscles and tissues that support your knee. The pain often goes away on its own with time and rest. If the pain does not go away, tests may be done to find out what is causing the pain. Follow these instructions at home: If you have a knee sleeve or brace:  Wear the knee sleeve or brace as told by your doctor. Take it off only as told by your doctor. Loosen it if your toes: Tingle. Become numb. Turn cold and blue. Keep it clean. If the knee sleeve or brace is not waterproof: Do not let it get wet. Cover it with a watertight covering when you take a bath or shower. Activity Rest your knee. Do not do things that cause pain or make pain worse. Avoid activities where both feet leave the ground at the same time (high-impact activities). Examples are running, jumping rope, and doing jumping jacks. Work with a physical therapist to make a safe exercise program, as told by your doctor. Managing pain, stiffness, and swelling  If told, put ice on the knee. To do this: If you have a removable knee sleeve or brace, take it off as told by your doctor. Put ice in a plastic bag. Place a towel between your skin and the bag. Leave the ice on for 20 minutes, 2-3 times a day. Take off the ice if your skin turns bright red. This is very important. If you cannot feel pain, heat, or cold, you have a greater risk of damage to the area. If told, use an elastic bandage to put pressure (compression) on your injured knee. Raise your knee above the level of your heart while you are sitting or lying down. Sleep with a pillow under your knee. General instructions Take over-the-counter and prescription medicines only as told by your doctor. Do not smoke or use any products that contain nicotine or tobacco. If you need help quitting, ask your doctor. If you are  overweight, work with your doctor and a food expert (dietitian) to set goals to lose weight. Being overweight can make your knee hurt more. Watch for any changes in your symptoms. Keep all follow-up visits. Contact a doctor if: The knee pain does not stop. The knee pain changes or gets worse. You have a fever along with knee pain. Your knee is red or feels warm when you touch it. Your knee gives out or locks up. Get help right away if: Your knee swells, and the swelling gets worse. You cannot move your knee. You have very bad knee pain that does not get better with pain medicine. Summary Many things can cause knee pain. The pain often goes away on its own with time and rest. Your doctor may do tests to find out the cause of the pain. Watch for any changes in your symptoms. Relieve your pain with rest, medicines, light activity, and use of ice. Get help right away if you cannot move your knee or your knee pain is very bad. This information is not intended to replace advice given to you by your health care provider. Make sure you discuss any questions you have with your health care provider. Document Revised: 09/03/2019 Document Reviewed: 09/03/2019 Elsevier Patient Education  2023 Elsevier Inc.  

## 2021-11-30 NOTE — Progress Notes (Signed)
Acute Office Visit  Subjective:     Patient ID: Casey Daniels, male    DOB: 04/04/1975, 46 y.o.   MRN: [id number]  Chief Complaint  Patient presents with   Knee Pain    Right knee    Knee Pain  The incident occurred 3 to 5 days ago. The incident occurred at home. There was no injury mechanism. The pain is present in the right knee. The quality of the pain is described as aching. The pain is at a severity of 5/10. The pain is moderate. The pain has been Intermittent since onset. Pertinent negatives include no loss of motion, loss of sensation, numbness or tingling. He reports no foreign bodies present. The symptoms are aggravated by movement and palpation. He has tried nothing for the symptoms.     Review of Systems  Constitutional: Negative.  Negative for chills and fever.  HENT: Negative.    Eyes: Negative.   Respiratory: Negative.    Cardiovascular: Negative.   Genitourinary: Negative.   Musculoskeletal:  Positive for joint pain.  Skin: Negative.  Negative for itching and rash.  Neurological:  Negative for tingling and numbness.  All other systems reviewed and are negative.       Objective:    BP 114/66   Pulse 78   Temp 98.7 F (37.1 C)   Ht '6\' 3"'$  (1.905 m)   Wt 196 lb (88.9 kg)   SpO2 96%   BMI 24.50 kg/m  BP Readings from Last 3 Encounters:  11/30/21 114/66  10/19/21 116/79  08/02/21 139/83   Wt Readings from Last 3 Encounters:  11/30/21 196 lb (88.9 kg)  10/19/21 197 lb 9.6 oz (89.6 kg)  08/02/21 203 lb 6.4 oz (92.3 kg)      Physical Exam Vitals and nursing note reviewed.  Constitutional:      Appearance: Normal appearance.  HENT:     Head: Normocephalic.     Right Ear: External ear normal.     Left Ear: External ear normal.  Cardiovascular:     Rate and Rhythm: Normal rate and regular rhythm.     Pulses: Normal pulses.     Heart sounds: Normal heart sounds.  Pulmonary:     Effort: Pulmonary effort is normal.     Breath sounds:  Normal breath sounds.  Musculoskeletal:     Right knee: Swelling present. No crepitus. Decreased range of motion. Tenderness present. No MCL, LCL or ACL tenderness. No LCL laxity, MCL laxity, ACL laxity or PCL laxity. Normal pulse.  Skin:    General: Skin is warm.  Neurological:     General: No focal deficit present.     Mental Status: He is alert and oriented to person, place, and time.     No results found for any visits on 11/30/21.      Assessment & Plan:  Acute right knee pain symptoms not well controlled in the last 4 to 5 days.  Assessed knee obvious swelling.  Completed right knee x-ray results pending.  Patient reports pain 5 out of 10 from a scale of 0-10. Ibuprofen 600 mg tablet by mouth as needed for pain, apply ice, elevate joint, knee brace recommended.  40 Depo-Medrol shot given in clinic.  Follow-up with worsening unresolved symptoms. Problem List Items Addressed This Visit   None Visit Diagnoses     Acute pain of right knee    -  Primary   Relevant Medications   ibuprofen (ADVIL) 600 MG tablet  Other Relevant Orders   DG Knee Complete 4 Views Right       Meds ordered this encounter  Medications   ibuprofen (ADVIL) 600 MG tablet    Sig: Take 1 tablet (600 mg total) by mouth every 8 (eight) hours as needed.    Dispense:  30 tablet    Refill:  0    Order Specific Question:   Supervising Provider    Answer:   Claretta Fraise [phone]    Return if symptoms worsen or fail to improve.  Ivy Lynn, NP

## 2021-12-06 ENCOUNTER — Ambulatory Visit: Payer: Medicare Other | Admitting: Podiatry

## 2021-12-06 ENCOUNTER — Ambulatory Visit: Payer: Medicare Other | Admitting: Family

## 2021-12-06 ENCOUNTER — Encounter: Payer: Self-pay | Admitting: Family

## 2021-12-20 ENCOUNTER — Ambulatory Visit: Payer: Medicare Other | Admitting: Podiatry

## 2021-12-28 ENCOUNTER — Ambulatory Visit (INDEPENDENT_AMBULATORY_CARE_PROVIDER_SITE_OTHER): Payer: Medicare Other | Admitting: Podiatry

## 2021-12-28 DIAGNOSIS — B070 Plantar wart: Secondary | ICD-10-CM | POA: Diagnosis not present

## 2021-12-28 NOTE — Progress Notes (Signed)
   Chief Complaint  Patient presents with   Plantar Warts    Plantar warts under bilateral feet     Subjective: 46 y.o. male presenting today for evaluation of symptomatic skin lesions to the weightbearing surfaces of the bilateral feet.  Patient states that his feet sweat a lot.  He is on his feet all day working.  He has not done anything recently for treatment.    Past Medical History:  Diagnosis Date   Arthritis    bilateral ankles due to club foot release as an infant   Asthma    Diabetes mellitus without complication ([place])    Hyperlipidemia     Objective: Physical Exam General: The patient is alert and oriented x3 in no acute distress.   Dermatology: Hyperkeratotic skin lesions noted to the plantar aspect of the bilateral feet approximately 1 cm in diameter. Pinpoint bleeding noted upon debridement. Skin is warm, dry and supple bilateral lower extremities. Negative for open lesions or macerations.   Vascular: Palpable pedal pulses bilaterally. No edema or erythema noted. Capillary refill within normal limits.   Neurological: Epicritic and protective threshold grossly intact bilaterally.    Musculoskeletal Exam: Pain on palpation to the noted skin lesions.  Range of motion within normal limits to all pedal and ankle joints bilateral. Muscle strength 5/5 in all groups bilateral.    Assessment: 1. plantar wart bilateral feet    Plan of Care:  1. Patient was evaluated. 2. Excisional debridement of the plantar wart lesion(s) was performed using a chisel blade.  Salicylic acid was applied and the lesion(s) was dressed with a dry sterile dressing. 3.  Rectal OTC wart remover daily as needed 4.  Patient is to return to clinic PRN  Edrick Kins, DPM [facility]  Dr. Edrick Kins, DPM    [address], [place] [address]                Office [phone]  Fax [phone]

## 2021-12-28 NOTE — Patient Instructions (Signed)
Corn and callus remover available at any pharmacy.  Apply daily

## 2021-12-30 ENCOUNTER — Ambulatory Visit: Payer: Medicare Other | Admitting: Family

## 2022-01-06 DIAGNOSIS — K047 Periapical abscess without sinus: Secondary | ICD-10-CM | POA: Diagnosis not present

## 2022-01-09 ENCOUNTER — Encounter: Payer: Self-pay | Admitting: Family

## 2022-01-09 ENCOUNTER — Ambulatory Visit (INDEPENDENT_AMBULATORY_CARE_PROVIDER_SITE_OTHER): Payer: Medicare Other | Admitting: Family

## 2022-01-09 VITALS — BP 126/80 | HR 80 | Temp 97.9°F | Ht 75.0 in | Wt 195.6 lb

## 2022-01-09 DIAGNOSIS — F17200 Nicotine dependence, unspecified, uncomplicated: Secondary | ICD-10-CM

## 2022-01-09 DIAGNOSIS — E1169 Type 2 diabetes mellitus with other specified complication: Secondary | ICD-10-CM | POA: Diagnosis not present

## 2022-01-09 DIAGNOSIS — E785 Hyperlipidemia, unspecified: Secondary | ICD-10-CM | POA: Diagnosis not present

## 2022-01-09 DIAGNOSIS — J4541 Moderate persistent asthma with (acute) exacerbation: Secondary | ICD-10-CM

## 2022-01-09 DIAGNOSIS — K047 Periapical abscess without sinus: Secondary | ICD-10-CM

## 2022-01-09 DIAGNOSIS — F1010 Alcohol abuse, uncomplicated: Secondary | ICD-10-CM

## 2022-01-09 DIAGNOSIS — Z0000 Encounter for general adult medical examination without abnormal findings: Secondary | ICD-10-CM

## 2022-01-09 DIAGNOSIS — Z0001 Encounter for general adult medical examination with abnormal findings: Secondary | ICD-10-CM

## 2022-01-09 DIAGNOSIS — E559 Vitamin D deficiency, unspecified: Secondary | ICD-10-CM

## 2022-01-09 LAB — BAYER DCA HB A1C WAIVED: HB A1C (BAYER DCA - WAIVED): 5.5 % (ref 4.8–5.6)

## 2022-01-09 NOTE — Progress Notes (Signed)
Subjective:    Patient ID: Casey Daniels, male    DOB: 09/12/1975, 46 y.o.   MRN: [id number]  Chief Complaint  Patient presents with   Medical Management of Chronic Issues    Tooth infection going to dentist 01/18/22   Pt presents to the office today for CPE and chronic follow up.  He reports he drinks 1-2 beers a day. He continues to smoke 1/2-1 pack a day.   He has an abscess tooth. Has an appointment with the dentist 01/18/22. He is taking Augmentin.  Asthma He complains of cough and wheezing. This is a chronic problem. The current episode started more than 1 year ago. The problem has been gradually improving. The cough is barking. His symptoms are alleviated by rest. He reports moderate improvement on treatment. His past medical history is significant for asthma.  Diabetes He presents for his follow-up diabetic visit. He has type 2 diabetes mellitus. Pertinent negatives for diabetes include no blurred vision and no foot paresthesias. Symptoms are stable. Risk factors for coronary artery disease include diabetes mellitus, dyslipidemia, male sex, hypertension and sedentary lifestyle. He is following a generally unhealthy diet. His overall blood glucose range is 90-110 mg/dl. Eye exam is not current.  Hyperlipidemia This is a chronic problem. The current episode started more than 1 year ago. The problem is controlled. Recent lipid tests were reviewed and are normal. Current antihyperlipidemic treatment includes statins. The current treatment provides moderate improvement of lipids. Risk factors for coronary artery disease include dyslipidemia, diabetes mellitus, male sex and a sedentary lifestyle.      Review of Systems  Eyes:  Negative for blurred vision.  Respiratory:  Positive for cough and wheezing.   All other systems reviewed and are negative.  Family History  Problem Relation Age of Onset   Diabetes Mother    Healthy Brother    Social History   Socioeconomic History    Marital status: Single    Spouse name: Not on file   Number of children: 0   Years of education: 12   Highest education level: High school graduate  Occupational History   Occupation: unemployed    Comment: disabled  Tobacco Use   Smoking status: Every Day    Packs/day: 0.25    Years: 15.00    Total pack years: 3.75    Types: Cigarettes   Smokeless tobacco: Never   Tobacco comments:    quits off and on  Vaping Use   Vaping Use: Never used  Substance and Sexual Activity   Alcohol use: Yes    Alcohol/week: 2.0 standard drinks of alcohol    Types: 2 Cans of beer per week   Drug use: Yes    Types: Marijuana   Sexual activity: Not Currently    Birth control/protection: None  Other Topics Concern   Not on file  Social History Narrative   Lives with his brother. No vehicle - rides his bike or walks to local appointments - Gets [facility] if appt out of town   Social Determinants of Health   Financial Resource Strain: Low Risk  (09/10/2020)   Overall Financial Resource Strain (CARDIA)    Difficulty of Paying Living Expenses: Not hard at all  Food Insecurity: No Food Insecurity (09/10/2020)   Hunger Vital Sign    Worried About Running Out of Food in the Last Year: Never true    Ran Out of Food in the Last Year: Never true  Transportation Needs: Unmet Transportation Needs (09/10/2020)  PRAPARE - Hydrologist (Medical): Yes    Lack of Transportation (Non-Medical): No  Physical Activity: Insufficiently Active (09/10/2020)   Exercise Vital Sign    Days of Exercise per Week: 7 days    Minutes of Exercise per Session: 20 min  Stress: No Stress Concern Present (09/10/2020)   [place]    Feeling of Stress : Not at all  Social Connections: Moderately Integrated (09/10/2020)   Social Connection and Isolation Panel [NHANES]    Frequency of Communication with Friends and Family: More than three  times a week    Frequency of Social Gatherings with Friends and Family: More than three times a week    Attends Religious Services: More than 4 times per year    Active Member of [employer] or Organizations: Yes    Attends Music therapist: More than 4 times per year    Marital Status: Never married       Objective:   Physical Exam Vitals reviewed.  Constitutional:      General: He is not in acute distress.    Appearance: He is well-developed.  HENT:     Head: Normocephalic.     Right Ear: Tympanic membrane normal.     Left Ear: Tympanic membrane normal.     Mouth/Throat:     Dentition: Abnormal dentition. Dental caries and dental abscesses present. No gingival swelling.   Eyes:     General:        Right eye: No discharge.        Left eye: No discharge.     Pupils: Pupils are equal, round, and reactive to light.  Neck:     Thyroid: No thyromegaly.  Cardiovascular:     Rate and Rhythm: Normal rate and regular rhythm.     Heart sounds: Normal heart sounds. No murmur heard. Pulmonary:     Effort: Pulmonary effort is normal. No respiratory distress.     Breath sounds: Normal breath sounds. No wheezing.  Abdominal:     General: Bowel sounds are normal. There is no distension.     Palpations: Abdomen is soft.     Tenderness: There is no abdominal tenderness.  Musculoskeletal:        General: No tenderness. Normal range of motion.     Cervical back: Normal range of motion and neck supple.  Skin:    General: Skin is warm and dry.     Findings: No erythema or rash.  Neurological:     Mental Status: He is alert and oriented to person, place, and time.     Cranial Nerves: No cranial nerve deficit.     Deep Tendon Reflexes: Reflexes are normal and symmetric.  Psychiatric:        Behavior: Behavior normal.        Thought Content: Thought content normal.        Judgment: Judgment normal.       BP 126/80   Pulse 80   Temp 97.9 F (36.6 C) (Temporal)   Ht $R'6\' 3"'rf$   (1.905 m)   Wt 195 lb 9.6 oz (88.7 kg)   SpO2 98%   BMI 24.45 kg/m      Assessment & Plan:  Daxton Nydam comes in today with chief complaint of Medical Management of Chronic Issues (Tooth infection going to dentist 01/18/22)   Diagnosis and orders addressed:  1. Moderate persistent asthma with acute exacerbation - CMP14+EGFR -  CBC with Differential/Platelet  2. Type 2 diabetes mellitus with other specified complication, without long-term current use of insulin (HCC) - Bayer DCA Hb A1c Waived - CMP14+EGFR - CBC with Differential/Platelet - Microalbumin / creatinine urine ratio  3. Current smoker - CMP14+EGFR - CBC with Differential/Platelet  4. Excessive drinking of alcohol - CMP14+EGFR - CBC with Differential/Platelet  5. Vitamin D deficiency - CMP14+EGFR - CBC with Differential/Platelet  6. Hyperlipidemia associated with type 2 diabetes mellitus (HCC) - CMP14+EGFR - CBC with Differential/Platelet - Lipid panel  7. Odontogenic abscess - CMP14+EGFR - CBC with Differential/Platelet  8. Annual physical exam - Bayer DCA Hb A1c Waived - CMP14+EGFR - CBC with Differential/Platelet - Lipid panel - PSA, total and free - TSH   Labs pending Health Maintenance reviewed Diet and exercise encouraged  Follow up plan: 4 months    Evelina Dun, FNP

## 2022-01-09 NOTE — Patient Instructions (Signed)
Dental Abscess  A dental abscess is an infection around a tooth that may involve pain, swelling, and a collection of pus, as well as other symptoms. Treatment is important to help with symptoms and to prevent the infection from spreading. The general types of dental abscesses are: Pulpal abscess. This abscess may form from the inner part of the tooth (pulp). Periodontal abscess. This abscess may form from the gum. What are the causes? This condition is caused by a bacterial infection in or around the tooth. It may result from: Severe tooth decay (cavities). Trauma to the tooth, such as a broken or chipped tooth. What increases the risk? This condition is more likely to develop in males. It is also more likely to develop in people who: Have cavities. Have severe gum disease. Eat sugary snacks between meals. Use tobacco products. Have diabetes. Have a weakened disease-fighting system (immune system). Do not brush and care for their teeth regularly. What are the signs or symptoms? Mild symptoms of this condition include: Tenderness. Bad breath. Fever. A bitter taste in the mouth. Pain in and around the infected tooth. Moderate symptoms of this condition include: Swollen neck glands. Chills. Pus drainage. Swelling and redness around the infected tooth, in the mouth, or in the face. Severe pain in and around the infected tooth. Severe symptoms of this condition include: Difficulty swallowing. Difficulty opening the mouth. Nausea. Vomiting. How is this diagnosed? This condition is diagnosed based on: Your symptoms and your medical and dental history. An examination of the infected tooth. During the exam, your dental care provider may tap on the infected tooth. You may also need to have X-rays taken of the affected area. How is this treated? This condition is treated by getting rid of the infection. This may be done with: Antibiotic medicines. These may be used in certain  situations. Antibacterial mouth rinse. Incision and drainage. This procedure is done by making an incision in the abscess to drain out the pus. Removing pus is the first priority in treating an abscess. A root canal. This may be performed to save the tooth. Your dental care provider accesses the visible part of your tooth (crown) with a drill and removes any infected pulp. Then the space is filled and sealed off. Tooth extraction. The tooth is pulled out if it cannot be saved by other treatment. You may also receive treatment for pain, such as: Acetaminophen or NSAIDs. Gels that contain a numbing medicine. An injection to block the pain near your nerve. Follow these instructions at home: Medicines Take over-the-counter and prescription medicines only as told by your dental care provider. If you were prescribed an antibiotic, take it as told by your dental care provider. Do not stop taking the antibiotic even if you start to feel better. If you were prescribed a gel that contains a numbing medicine, use it exactly as told in the directions. Do not use these gels for children who are younger than 2 years of age. Use an antibacterial mouth rinse as told by your dental care provider. General instructions  Gargle with a mixture of salt and water 3-4 times a day or as needed. To make salt water, completely dissolve -1 tsp (3-6 g) of salt in 1 cup (237 mL) of warm water. Eat a soft diet while your abscess is healing. Drink enough fluid to keep your urine pale yellow. Do not apply heat to the outside of your mouth. Do not use any products that contain nicotine or tobacco. These   products include cigarettes, chewing tobacco, and vaping devices, such as e-cigarettes. If you need help quitting, ask your dental care provider. Keep all follow-up visits. This is important. How is this prevented?  Excellent dental home care, which includes brushing your teeth every morning and night with fluoride  toothpaste. Floss one time each day. Get regularly scheduled dental cleanings. Consider having a dental sealant applied on teeth that have deep grooves to prevent cavities. Drink fluoridated water regularly. This includes most tap water. Check the label on bottled water to see if it contains fluoride. Reduce or eliminate sugary drinks. Eat healthy meals and snacks. Wear a mouth guard or face shield to protect your teeth while playing sports. Contact a health care provider if: Your pain is worse and is not helped by medicine. You have swelling. You see pus around the tooth. You have a fever or chills. Get help right away if: Your symptoms suddenly get worse. You have a very bad headache. You have problems breathing or swallowing. You have trouble opening your mouth. You have swelling in your neck or around your eye. These symptoms may represent a serious problem that is an emergency. Do not wait to see if the symptoms will go away. Get medical help right away. Call your local emergency services (911 in the [place] Do not drive yourself to the hospital. Summary A dental abscess is a collection of pus in or around a tooth that results from an infection. A dental abscess may result from severe tooth decay, trauma to the tooth, or severe gum disease around a tooth. Symptoms include severe pain, swelling, redness, and drainage of pus in and around the infected tooth. The first priority in treating a dental abscess is to drain out the pus. Treatment may also involve removing damage inside the tooth (root canal) or extracting the tooth. This information is not intended to replace advice given to you by your health care provider. Make sure you discuss any questions you have with your health care provider. Document Revised: 05/27/2020 Document Reviewed: 05/27/2020 Elsevier Patient Education  2023 Elsevier Inc.  

## 2022-01-10 LAB — CBC WITH DIFFERENTIAL/PLATELET
Basophils Absolute: 0 10*3/uL (ref 0.0–0.2)
Basos: 1 %
EOS (ABSOLUTE): 0.1 10*3/uL (ref 0.0–0.4)
Eos: 3 %
Hematocrit: 41 % (ref 37.5–51.0)
Hemoglobin: 14.1 g/dL (ref 13.0–17.7)
Immature Grans (Abs): 0 10*3/uL (ref 0.0–0.1)
Immature Granulocytes: 0 %
Lymphocytes Absolute: 1.5 10*3/uL (ref 0.7–3.1)
Lymphs: 37 %
MCH: 30.9 pg (ref 26.6–33.0)
MCHC: 34.4 g/dL (ref 31.5–35.7)
MCV: 90 fL (ref 79–97)
Monocytes Absolute: 0.5 10*3/uL (ref 0.1–0.9)
Monocytes: 12 %
Neutrophils Absolute: 1.9 10*3/uL (ref 1.4–7.0)
Neutrophils: 47 %
Platelets: 246 10*3/uL (ref 150–450)
RBC: 4.56 x10E6/uL (ref 4.14–5.80)
RDW: 13 % (ref 11.6–15.4)
WBC: 4 10*3/uL (ref 3.4–10.8)

## 2022-01-10 LAB — CMP14+EGFR
ALT: 26 IU/L (ref 0–44)
AST: 26 IU/L (ref 0–40)
Albumin/Globulin Ratio: 1.8 (ref 1.2–2.2)
Albumin: 4.4 g/dL (ref 4.1–5.1)
Alkaline Phosphatase: 67 IU/L (ref 44–121)
BUN/Creatinine Ratio: 9 (ref 9–20)
BUN: 12 mg/dL (ref 6–24)
Bilirubin Total: 0.3 mg/dL (ref 0.0–1.2)
CO2: 26 mmol/L (ref 20–29)
Calcium: 10.1 mg/dL (ref 8.7–10.2)
Chloride: 100 mmol/L (ref 96–106)
Creatinine, Ser: 1.28 mg/dL — ABNORMAL HIGH (ref 0.76–1.27)
Globulin, Total: 2.5 g/dL (ref 1.5–4.5)
Glucose: 90 mg/dL (ref 70–99)
Potassium: 4.6 mmol/L (ref 3.5–5.2)
Sodium: 139 mmol/L (ref 134–144)
Total Protein: 6.9 g/dL (ref 6.0–8.5)
eGFR: 69 mL/min/{1.73_m2} (ref >59)

## 2022-01-10 LAB — LIPID PANEL
Chol/HDL Ratio: 1.9 ratio (ref 0.0–5.0)
Cholesterol, Total: 156 mg/dL (ref 100–199)
HDL: 82 mg/dL (ref >39)
LDL Chol Calc (NIH): 62 mg/dL (ref 0–99)
Triglycerides: 59 mg/dL (ref 0–149)
VLDL Cholesterol Cal: 12 mg/dL (ref 5–40)

## 2022-01-10 LAB — MICROALBUMIN / CREATININE URINE RATIO
Creatinine, Urine: 115.7 mg/dL
Microalb/Creat Ratio: 3 mg/g creat (ref 0–29)
Microalbumin, Urine: 3.5 ug/mL

## 2022-01-10 LAB — PSA, TOTAL AND FREE
PSA, Free Pct: 30 %
PSA, Free: 0.12 ng/mL
Prostate Specific Ag, Serum: 0.4 ng/mL (ref 0.0–4.0)

## 2022-01-10 LAB — TSH: TSH: 1.3 u[IU]/mL (ref 0.450–4.500)

## 2022-02-13 ENCOUNTER — Other Ambulatory Visit: Payer: Self-pay | Admitting: Family

## 2022-02-13 DIAGNOSIS — E1169 Type 2 diabetes mellitus with other specified complication: Secondary | ICD-10-CM

## 2022-02-16 ENCOUNTER — Telehealth: Payer: Self-pay | Admitting: Family

## 2022-02-16 NOTE — Telephone Encounter (Signed)
Left message for patient to call back and schedule Medicare Annual Wellness Visit (AWV) to be completed by video or phone.   Last AWV: 09/10/2020   Please schedule at anytime with [place]     45 minute appointment  Any questions, please contact me at [phone]

## 2022-03-06 ENCOUNTER — Ambulatory Visit: Payer: Medicare Other | Admitting: Nurse Practitioner

## 2022-03-16 ENCOUNTER — Encounter: Payer: Self-pay | Admitting: Family

## 2022-03-21 ENCOUNTER — Telehealth: Payer: Self-pay | Admitting: Family

## 2022-03-21 NOTE — Telephone Encounter (Signed)
Patient aware how to take medication

## 2022-03-30 ENCOUNTER — Other Ambulatory Visit: Payer: Self-pay | Admitting: Family

## 2022-03-30 DIAGNOSIS — J4540 Moderate persistent asthma, uncomplicated: Secondary | ICD-10-CM

## 2022-04-12 ENCOUNTER — Ambulatory Visit: Payer: Medicare Other | Admitting: Nurse Practitioner

## 2022-05-12 ENCOUNTER — Ambulatory Visit (INDEPENDENT_AMBULATORY_CARE_PROVIDER_SITE_OTHER): Payer: 59 | Admitting: Family

## 2022-05-12 ENCOUNTER — Encounter: Payer: Self-pay | Admitting: Family

## 2022-05-12 VITALS — BP 124/90 | HR 91 | Temp 98.2°F | Ht 75.0 in | Wt 192.0 lb

## 2022-05-12 DIAGNOSIS — F1010 Alcohol abuse, uncomplicated: Secondary | ICD-10-CM

## 2022-05-12 DIAGNOSIS — E785 Hyperlipidemia, unspecified: Secondary | ICD-10-CM

## 2022-05-12 DIAGNOSIS — E1169 Type 2 diabetes mellitus with other specified complication: Secondary | ICD-10-CM | POA: Diagnosis not present

## 2022-05-12 DIAGNOSIS — Z23 Encounter for immunization: Secondary | ICD-10-CM | POA: Diagnosis not present

## 2022-05-12 DIAGNOSIS — J4541 Moderate persistent asthma with (acute) exacerbation: Secondary | ICD-10-CM

## 2022-05-12 DIAGNOSIS — F17200 Nicotine dependence, unspecified, uncomplicated: Secondary | ICD-10-CM

## 2022-05-12 DIAGNOSIS — E559 Vitamin D deficiency, unspecified: Secondary | ICD-10-CM

## 2022-05-12 DIAGNOSIS — N529 Male erectile dysfunction, unspecified: Secondary | ICD-10-CM | POA: Insufficient documentation

## 2022-05-12 LAB — BAYER DCA HB A1C WAIVED: HB A1C (BAYER DCA - WAIVED): 6 % — ABNORMAL HIGH (ref 4.8–5.6)

## 2022-05-12 MED ORDER — SILDENAFIL CITRATE 100 MG PO TABS: 50.0000 mg | ORAL_TABLET | Freq: Every day | ORAL | 2 refills | Status: DC | PRN

## 2022-05-12 NOTE — Progress Notes (Signed)
Subjective:    Patient ID: Casey Daniels, male    DOB: 09-23-1975, 47 y.o.   MRN: [id number]  Chief Complaint  Patient presents with   Medical Management of Chronic Issues   Pt presents to the office today for chronic follow up.  He has drinks 1-2 beers a day and  smoking 1/2-1 pack a day.  However, reports since Sunday he quit drinking and smoking.   He complaining ED. Reports he can get an erection, but can not maintain the erection. He is not able to ejaculating. Wanting to think about having children.  Asthma He complains of cough and shortness of breath. This is a chronic problem. The current episode started more than 1 year ago. The cough is productive of purulent sputum. His symptoms are alleviated by rest and OTC cough suppressant. He reports moderate improvement on treatment. His past medical history is significant for asthma.  Hyperlipidemia This is a chronic problem. The current episode started more than 1 year ago. The problem is controlled. Recent lipid tests were reviewed and are normal. Associated symptoms include shortness of breath. Current antihyperlipidemic treatment includes statins. The current treatment provides moderate improvement of lipids.  Diabetes He presents for his follow-up diabetic visit. He has type 2 diabetes mellitus. Pertinent negatives for diabetes include no blurred vision and no foot paresthesias. Risk factors for coronary artery disease include dyslipidemia, diabetes mellitus and hypertension. He is following a generally healthy diet. His overall blood glucose range is 90-110 mg/dl.      Review of Systems  Eyes:  Negative for blurred vision.  Respiratory:  Positive for cough and shortness of breath.   All other systems reviewed and are negative.      Objective:   Physical Exam Vitals reviewed.  Constitutional:      General: He is not in acute distress.    Appearance: He is well-developed.  HENT:     Head: Normocephalic.     Right  Ear: Tympanic membrane normal.     Left Ear: Tympanic membrane normal.  Eyes:     General:        Right eye: No discharge.        Left eye: No discharge.     Pupils: Pupils are equal, round, and reactive to light.  Neck:     Thyroid: No thyromegaly.  Cardiovascular:     Rate and Rhythm: Normal rate and regular rhythm.     Heart sounds: Normal heart sounds. No murmur heard. Pulmonary:     Effort: Pulmonary effort is normal. No respiratory distress.     Breath sounds: Normal breath sounds. No wheezing.  Abdominal:     General: Bowel sounds are normal. There is no distension.     Palpations: Abdomen is soft.     Tenderness: There is no abdominal tenderness.  Musculoskeletal:        General: No tenderness. Normal range of motion.     Cervical back: Normal range of motion and neck supple.  Skin:    General: Skin is warm and dry.     Findings: No erythema or rash.  Neurological:     Mental Status: He is alert and oriented to person, place, and time.     Cranial Nerves: No cranial nerve deficit.     Deep Tendon Reflexes: Reflexes are normal and symmetric.  Psychiatric:        Behavior: Behavior normal.        Thought Content: Thought content normal.  Judgment: Judgment normal.      BP (!) 124/90   Pulse 91   Temp 98.2 F (36.8 C) (Temporal)   Ht 6' 3"$  (1.905 m)   Wt 192 lb (87.1 kg)   SpO2 96%   BMI 24.00 kg/m       Assessment & Plan:  Nolon Hanneman comes in today with chief complaint of Medical Management of Chronic Issues   Diagnosis and orders addressed:  1. Moderate persistent asthma with acute exacerbation - CMP14+EGFR  2. Vitamin D deficiency - CMP14+EGFR  3. Current smoker - CMP14+EGFR  4. Type 2 diabetes mellitus with other specified complication, without long-term current use of insulin (HCC) - CMP14+EGFR - Bayer DCA Hb A1c Waived  5. Excessive drinking of alcohol  - CMP14+EGFR  6. Hyperlipidemia associated with type 2 diabetes  mellitus (HCC) - CMP14+EGFR  7. Erectile dysfunction, unspecified erectile dysfunction type -Will start Viagra today as needed  Safe sex Continue to limit alcohol and smoking cessation - sildenafil (VIAGRA) 100 MG tablet; Take 0.5-1 tablets (50-100 mg total) by mouth daily as needed for erectile dysfunction.  Dispense: 20 tablet; Refill: 2 - CMP14+EGFR   Labs pending Health Maintenance reviewed Diet and exercise encouraged  Follow up plan: 6 months    Evelina Dun, FNP

## 2022-05-12 NOTE — Patient Instructions (Signed)
Erectile Dysfunction ?Erectile dysfunction (ED) is the inability to get or keep an erection in order to have sexual intercourse. ED is considered a symptom of an underlying disorder and is not considered a disease. ED may include: ?Inability to get an erection. ?Lack of enough hardness of the erection to allow penetration. ?Loss of erection before sex is finished. ?What are the causes? ?This condition may be caused by: ?Physical causes, such as: ?Artery problems. This may include heart disease, high blood pressure, atherosclerosis, and diabetes. ?Hormonal problems, such as low testosterone. ?Obesity. ?Nerve problems. This may include back or pelvic injuries, multiple sclerosis, Parkinson's disease, spinal cord injury, and stroke. ?Certain medicines, such as: ?Pain relievers. ?Antidepressants. ?Blood pressure medicines and water pills (diuretics). ?Cancer medicines. ?Antihistamines. ?Muscle relaxants. ?Lifestyle factors, such as: ?Use of drugs such as marijuana, cocaine, or opioids. ?Excessive use of alcohol. ?Smoking. ?Lack of physical activity or exercise. ?Psychological causes, such as: ?Anxiety or stress. ?Sadness or depression. ?Exhaustion. ?Fear about sexual performance. ?Guilt. ?What are the signs or symptoms? ?Symptoms of this condition include: ?Inability to get an erection. ?Lack of enough hardness of the erection to allow penetration. ?Loss of the erection before sex is finished. ?Sometimes having normal erections, but with frequent unsatisfactory episodes. ?Low sexual satisfaction in either partner due to erection problems. ?A curved penis occurring with erection. The curve may cause pain, or the penis may be too curved to allow for intercourse. ?Never having nighttime or morning erections. ?How is this diagnosed? ?This condition is often diagnosed by: ?Performing a physical exam to find other diseases or specific problems with the penis. ?Asking you detailed questions about the problem. ?Doing tests,  such as: ?Blood tests to check for diabetes mellitus or high cholesterol, or to measure hormone levels. ?Other tests to check for underlying health conditions. ?An ultrasound exam to check for scarring. ?A test to check blood flow to the penis. ?Doing a sleep study at home to measure nighttime erections. ?How is this treated? ?This condition may be treated by: ?Medicines, such as: ?Medicine taken by mouth to help you achieve an erection (oral medicine). ?Hormone replacement therapy to replace low testosterone levels. ?Medicine that is injected into the penis. Your health care provider may instruct you how to give yourself these injections at home. ?Medicine that is delivered with a short applicator tube. The tube is inserted into the opening at the tip of the penis, which is the opening of the urethra. A tiny pellet of medicine is put in the urethra. The pellet dissolves and enhances erectile function. This is also called MUSE (medicated urethral system for erections) therapy. ?Vacuum pump. This is a pump with a ring on it. The pump and ring are placed on the penis and used to create pressure that helps the penis become erect. ?Penile implant surgery. In this procedure, you may receive: ?An inflatable implant. This consists of cylinders, a pump, and a reservoir. The cylinders can be inflated with a fluid that helps to create an erection, and they can be deflated after intercourse. ?A semi-rigid implant. This consists of two silicone rubber rods. The rods provide some rigidity. They are also flexible, so the penis can both curve downward in its normal position and become straight for sexual intercourse. ?Blood vessel surgery to improve blood flow to the penis. During this procedure, a blood vessel from a different part of the body is placed into the penis to allow blood to flow around (bypass) damaged or blocked blood vessels. ?Lifestyle changes,   such as exercising more, losing weight, and quitting smoking. ?Follow  these instructions at home: ?Medicines ? ?Take over-the-counter and prescription medicines only as told by your health care provider. Do not increase the dosage without first discussing it with your health care provider. ?If you are using self-injections, do injections as directed by your health care provider. Make sure you avoid any veins that are on the surface of the penis. After giving an injection, apply pressure to the injection site for 5 minutes. ?Talk to your health care provider about how to prevent headaches while taking ED medicines. These medicines may cause a sudden headache due to the increase in blood flow in your body. ?General instructions ?Exercise regularly, as directed by your health care provider. Work with your health care provider to lose weight, if needed. ?Do not use any products that contain nicotine or tobacco. These products include cigarettes, chewing tobacco, and vaping devices, such as e-cigarettes. If you need help quitting, ask your health care provider. ?Before using a vacuum pump, read the instructions that come with the pump and discuss any questions with your health care provider. ?Keep all follow-up visits. This is important. ?Contact a health care provider if: ?You feel nauseous. ?You are vomiting. ?You get sudden headaches while taking ED medicines. ?You have any concerns about your sexual health. ?Get help right away if: ?You are taking oral or injectable medicines and you have an erection that lasts longer than 4 hours. If your health care provider is unavailable, go to the nearest emergency room for evaluation. An erection that lasts much longer than 4 hours can result in permanent damage to your penis. ?You have severe pain in your groin or abdomen. ?You develop redness or severe swelling of your penis. ?You have redness spreading at your groin or lower abdomen. ?You are unable to urinate. ?You experience chest pain or a rapid heartbeat (palpitations) after taking oral  medicines. ?These symptoms may represent a serious problem that is an emergency. Do not wait to see if the symptoms will go away. Get medical help right away. Call your local emergency services (911 in the [place] Do not drive yourself to the hospital. ?Summary ?Erectile dysfunction (ED) is the inability to get or keep an erection during sexual intercourse. ?This condition is diagnosed based on a physical exam, your symptoms, and tests to determine the cause. Treatment varies depending on the cause and may include medicines, hormone therapy, surgery, or a vacuum pump. ?You may need follow-up visits to make sure that you are using your medicines or devices correctly. ?Get help right away if you are taking or injecting medicines and you have an erection that lasts longer than 4 hours. ?This information is not intended to replace advice given to you by your health care provider. Make sure you discuss any questions you have with your health care provider. ?Document Revised: 06/16/2020 Document Reviewed: 06/16/2020 ?Elsevier Patient Education ? 2023 Elsevier Inc. ? ?

## 2022-05-13 LAB — CMP14+EGFR
ALT: 27 IU/L (ref 0–44)
AST: 29 IU/L (ref 0–40)
Albumin/Globulin Ratio: 1.7 (ref 1.2–2.2)
Albumin: 4.5 g/dL (ref 4.1–5.1)
Alkaline Phosphatase: 62 IU/L (ref 44–121)
BUN/Creatinine Ratio: 15 (ref 9–20)
BUN: 19 mg/dL (ref 6–24)
Bilirubin Total: 0.3 mg/dL (ref 0.0–1.2)
CO2: 23 mmol/L (ref 20–29)
Calcium: 9.9 mg/dL (ref 8.7–10.2)
Chloride: 102 mmol/L (ref 96–106)
Creatinine, Ser: 1.3 mg/dL — ABNORMAL HIGH (ref 0.76–1.27)
Globulin, Total: 2.6 g/dL (ref 1.5–4.5)
Glucose: 76 mg/dL (ref 70–99)
Potassium: 4.4 mmol/L (ref 3.5–5.2)
Sodium: 142 mmol/L (ref 134–144)
Total Protein: 7.1 g/dL (ref 6.0–8.5)
eGFR: 68 mL/min/{1.73_m2} (ref >59)

## 2022-05-24 ENCOUNTER — Telehealth: Payer: Self-pay | Admitting: Family

## 2022-05-24 NOTE — Telephone Encounter (Signed)
Attempted to contact - NA 

## 2022-05-24 NOTE — Telephone Encounter (Signed)
Spoke with patient to schedule AWV and he asked to have someone call him in reference to his recent issues with erectile dysfunction

## 2022-06-13 ENCOUNTER — Ambulatory Visit: Payer: 59 | Admitting: Family

## 2022-06-14 ENCOUNTER — Encounter: Payer: Self-pay | Admitting: Family

## 2022-06-19 ENCOUNTER — Telehealth: Payer: Self-pay | Admitting: Family

## 2022-06-19 NOTE — Telephone Encounter (Signed)
Called patient to schedule Medicare Annual Wellness Visit (AWV). No voicemail available to leave a message.  Last date of AWV: 09/10/2020   Please schedule an appointment at any time with either Mickel Baas or [place], [facility]. .  If any questions, please contact me at [phone].  Thank you,  Colletta [place],  [facility] Direct Dial ??[phone]

## 2022-07-06 NOTE — Progress Notes (Signed)
[facility] Quality Team Note  Name: Casey Daniels Date of Birth: 1975/12/22 MRN: [id number] Date: 07/06/2022  [facility] Quality Team has reviewed this patient's chart, please see recommendations below:  [facility] Quality Other; (Called patient in regards to [name] event. Next event 09/20/2022, Left voicemail)

## 2022-07-18 ENCOUNTER — Ambulatory Visit (INDEPENDENT_AMBULATORY_CARE_PROVIDER_SITE_OTHER): Payer: 59 | Admitting: Family Medicine

## 2022-07-18 ENCOUNTER — Encounter: Payer: Self-pay | Admitting: Family Medicine

## 2022-07-18 VITALS — BP 102/71 | HR 68 | Temp 98.1°F | Ht 75.0 in | Wt 205.0 lb

## 2022-07-18 DIAGNOSIS — N528 Other male erectile dysfunction: Secondary | ICD-10-CM | POA: Diagnosis not present

## 2022-07-18 DIAGNOSIS — N4889 Other specified disorders of penis: Secondary | ICD-10-CM | POA: Diagnosis not present

## 2022-07-18 NOTE — Progress Notes (Signed)
Subjective:  Patient ID: Casey Daniels, male    DOB: [birth year], 47 y.o.   MRN: [id number]  Patient Care Team: Junie Spencer, FNP as PCP - General (Family Medicine) Lanelle Bal, DO as Consulting Physician (Internal Medicine) Michaelle Copas, MD as Referring Physician (Optometry)   Chief Complaint:  Erectile Dysfunction   HPI: Casey Daniels is a 47 y.o. male presenting on 07/18/2022 for Erectile Dysfunction   Pt presents today for evaluation of penis size. He has erectile dysfunction which is treated with Viagra. States this helps with erection but not with size. States his penis is small, reports he wants it to be "big and long".     Relevant past medical, surgical, family, and social history reviewed and updated as indicated.  Allergies and medications reviewed and updated. Data reviewed: Chart in Epic.   Past Medical History:  Diagnosis Date   Arthritis    bilateral ankles due to club foot release as an infant   Asthma    Diabetes mellitus without complication    Hyperlipidemia     Past Surgical History:  Procedure Laterality Date   CLUB FOOT RELEASE Bilateral    INCISION AND DRAINAGE ABSCESS N/A 03/12/2019   Procedure: INCISION AND DRAINAGE NECK ABSCESS;  Surgeon: Newman Pies, MD;  Location: [facility] OR;  Service: ENT;  Laterality: N/A;    Social History   Socioeconomic History   Marital status: Single    Spouse name: Not on file   Number of children: 0   Years of education: 12   Highest education level: High school graduate  Occupational History   Occupation: unemployed    Comment: disabled  Tobacco Use   Smoking status: Every Day    Packs/day: 0.25    Years: 15.00    Additional pack years: 0.00    Total pack years: 3.75    Types: Cigarettes   Smokeless tobacco: Never   Tobacco comments:    quits off and on  Vaping Use   Vaping Use: Never used  Substance and Sexual Activity   Alcohol use: Yes    Alcohol/week: 2.0 standard drinks of  alcohol    Types: 2 Cans of beer per week   Drug use: Yes    Types: Marijuana   Sexual activity: Not Currently    Birth control/protection: None  Other Topics Concern   Not on file  Social History Narrative   Lives with his brother. No vehicle - rides his bike or walks to local appointments - Gets [facility] if appt out of town   Social Determinants of Health   Financial Resource Strain: Low Risk  (09/10/2020)   Overall Financial Resource Strain (CARDIA)    Difficulty of Paying Living Expenses: Not hard at all  Food Insecurity: No Food Insecurity (09/10/2020)   Hunger Vital Sign    Worried About Running Out of Food in the Last Year: Never true    Ran Out of Food in the Last Year: Never true  Transportation Needs: Unmet Transportation Needs (09/10/2020)   PRAPARE - Administrator, Civil Service (Medical): Yes    Lack of Transportation (Non-Medical): No  Physical Activity: Insufficiently Active (09/10/2020)   Exercise Vital Sign    Days of Exercise per Week: 7 days    Minutes of Exercise per Session: 20 min  Stress: No Stress Concern Present (09/10/2020)   [employer] of Occupational Health - Occupational Stress Questionnaire    Feeling of Stress :  Not at all  Social Connections: Moderately Integrated (09/10/2020)   Social Connection and Isolation Panel [NHANES]    Frequency of Communication with Friends and Family: More than three times a week    Frequency of Social Gatherings with Friends and Family: More than three times a week    Attends Religious Services: More than 4 times per year    Active Member of [employer] or Organizations: Yes    Attends Banker Meetings: More than 4 times per year    Marital Status: Never married  Intimate Partner Violence: Not At Risk (09/10/2020)   Humiliation, Afraid, Rape, and Kick questionnaire    Fear of Current or Ex-Partner: No    Emotionally Abused: No    Physically Abused: No    Sexually Abused: No    Outpatient  Encounter Medications as of 07/18/2022  Medication Sig   Accu-Chek Softclix Lancets lancets Check BS twice daily and as needed Dx E11.9   atorvastatin (LIPITOR) 20 MG tablet TAKE 1 TABLET DAILY   blood glucose meter kit and supplies Check BS BID and PRN (dx E11.9)   glucose blood (GLUCOSE METER TEST) test strip Use BID   metFORMIN (GLUCOPHAGE-XR) 500 MG 24 hr tablet TAKE ONE TABLET ONCE DAILY WITH BREAKFAST   sildenafil (VIAGRA) 100 MG tablet Take 0.5-1 tablets (50-100 mg total) by mouth daily as needed for erectile dysfunction.   SYMBICORT 80-4.5 MCG/ACT inhaler 2 PUFFS 2 TIMES A DAY   No facility-administered encounter medications on file as of 07/18/2022.    Allergies  Allergen Reactions   Bee Venom Swelling    Review of Systems  Genitourinary:  Negative for decreased urine volume, difficulty urinating, enuresis, flank pain, genital sores, penile discharge, penile pain, penile swelling, scrotal swelling and testicular pain.       Reports penis is small  All other systems reviewed and are negative.       Objective:  BP 102/71   Pulse 68   Temp 98.1 F (36.7 C) (Temporal)   Ht  (1.905 m)   Wt 205 lb (93 kg)   BMI 25.62 kg/m    Wt Readings from Last 3 Encounters:  07/18/22 205 lb (93 kg)  05/12/22 192 lb (87.1 kg)  01/09/22 195 lb 9.6 oz (88.7 kg)    Physical Exam Vitals and nursing note reviewed. Exam conducted with a chaperone present.  Constitutional:      General: He is not in acute distress.    Appearance: Normal appearance. He is not ill-appearing, toxic-appearing or diaphoretic.  HENT:     Head: Normocephalic and atraumatic.     Mouth/Throat:     Mouth: Mucous membranes are moist.  Eyes:     Conjunctiva/sclera: Conjunctivae normal.     Pupils: Pupils are equal, round, and reactive to light.  Cardiovascular:     Rate and Rhythm: Normal rate and regular rhythm.     Heart sounds: Normal heart sounds.  Pulmonary:     Effort: Pulmonary effort is normal.      Breath sounds: Normal breath sounds.  Genitourinary:    Pubic Area: No rash or pubic lice.      Penis: Normal and circumcised. No phimosis, paraphimosis, hypospadias, erythema, tenderness, discharge, swelling or lesions.      Testes: Normal. Cremasteric reflex is present.     Epididymis:     Right: Normal.     Left: Normal.     Tanner stage (genital): 5.  Musculoskeletal:     Cervical  back: Neck supple.     Right lower leg: No edema.     Left lower leg: No edema.  Skin:    General: Skin is warm and dry.     Capillary Refill: Capillary refill takes less than 2 seconds.  Neurological:     General: No focal deficit present.     Mental Status: He is alert and oriented to person, place, and time.  Psychiatric:        Mood and Affect: Mood normal.        Behavior: Behavior normal.        Thought Content: Thought content normal.        Judgment: Judgment normal.     Results for orders placed or performed in visit on 05/12/22  CMP14+EGFR  Result Value Ref Range   Glucose 76 70 - 99 mg/dL   BUN 19 6 - 24 mg/dL   Creatinine, Ser 6.57 (H) 0.76 - 1.27 mg/dL   eGFR 68 >84 ON/GEX/5.28   BUN/Creatinine Ratio 15 9 - 20   Sodium 142 134 - 144 mmol/L   Potassium 4.4 3.5 - 5.2 mmol/L   Chloride 102 96 - 106 mmol/L   CO2 23 20 - 29 mmol/L   Calcium 9.9 8.7 - 10.2 mg/dL   Total Protein 7.1 6.0 - 8.5 g/dL   Albumin 4.5 4.1 - 5.1 g/dL   Globulin, Total 2.6 1.5 - 4.5 g/dL   Albumin/Globulin Ratio 1.7 1.2 - 2.2   Bilirubin Total 0.3 0.0 - 1.2 mg/dL   Alkaline Phosphatase 62 44 - 121 IU/L   AST 29 0 - 40 IU/L   ALT 27 0 - 44 IU/L  Bayer DCA Hb A1c Waived  Result Value Ref Range   HB A1C (BAYER DCA - WAIVED) 6.0 (H) 4.8 - 5.6 %       Pertinent labs & imaging results that were available during my care of the patient were reviewed by me and considered in my medical decision making.  Assessment & Plan:  Ogle was seen today for erectile dysfunction.  Diagnoses and all orders for  this visit:  Other male erectile dysfunction Small penis Pt states he is able to get an erection with the Viagra but feels his penis size is inadequate. Average size unerect penis on exam. Will refer to urology for evaluation.  -     Ambulatory referral to Urology     Continue all other maintenance medications.  Follow up plan: Return if symptoms worsen or fail to improve.   Continue healthy lifestyle choices, including diet (rich in fruits, vegetables, and lean proteins, and low in salt and simple carbohydrates) and exercise (at least 30 minutes of moderate physical activity daily).   The above assessment and management plan was discussed with the patient. The patient verbalized understanding of and has agreed to the management plan. Patient is aware to call the clinic if they develop any new symptoms or if symptoms persist or worsen. Patient is aware when to return to the clinic for a follow-up visit. Patient educated on when it is appropriate to go to the emergency department.   Kari Baars, FNP-C [facility] [phone]

## 2022-07-21 DIAGNOSIS — K047 Periapical abscess without sinus: Secondary | ICD-10-CM | POA: Diagnosis not present

## 2022-07-24 ENCOUNTER — Telehealth: Payer: Self-pay | Admitting: Family

## 2022-07-24 NOTE — Telephone Encounter (Signed)
.  Contacted Babatunde Seago to schedule their annual wellness visit. Appointment made for 08/02/2022 .  Thank you,  Judeth Cornfield,  AMB Clinical Support [facility] AWV Program Direct Dial ??[phone]

## 2022-08-09 ENCOUNTER — Telehealth: Payer: Self-pay | Admitting: Family

## 2022-08-09 NOTE — Telephone Encounter (Signed)
Contacted Casey Daniels to schedule their annual wellness visit. Appointment made for 08/18/2022.  Thank you,  Judeth Cornfield,  AMB Clinical Support [facility] AWV Program Direct Dial ??[phone]

## 2022-08-17 ENCOUNTER — Other Ambulatory Visit: Payer: Self-pay | Admitting: Family

## 2022-08-17 DIAGNOSIS — E1169 Type 2 diabetes mellitus with other specified complication: Secondary | ICD-10-CM

## 2022-09-01 ENCOUNTER — Telehealth: Payer: Self-pay

## 2022-09-01 ENCOUNTER — Ambulatory Visit (INDEPENDENT_AMBULATORY_CARE_PROVIDER_SITE_OTHER): Payer: 59

## 2022-09-01 VITALS — Wt 205.0 lb

## 2022-09-01 DIAGNOSIS — Z87891 Personal history of nicotine dependence: Secondary | ICD-10-CM | POA: Diagnosis not present

## 2022-09-01 DIAGNOSIS — Z0000 Encounter for general adult medical examination without abnormal findings: Secondary | ICD-10-CM

## 2022-09-01 NOTE — Telephone Encounter (Signed)
Called pt regarding his AWV at the time of his appt pt didn't answer lvm for patient for patient to call back , tried calling patient a 2nd time patient didn't answer. Patient will need to reschedule his AWV.

## 2022-09-01 NOTE — Patient Instructions (Signed)
Casey Daniels , Thank you for taking time to come for your Medicare Wellness Visit. I appreciate your ongoing commitment to your health goals. Please review the following plan we discussed and let me know if I can assist you in the future.   These are the goals we discussed:  Goals      DIET - INCREASE WATER INTAKE     DIET - REDUCE FAT INTAKE     Quit Smoking        This is a list of the screening recommended for you and due dates:  Health Maintenance  Topic Date Due   Eye exam for diabetics  Never done   Colon Cancer Screening  Never done   COVID-19 Vaccine (3 - 2023-24 season) 12/02/2021   Flu Shot  11/02/2022   Hemoglobin A1C  11/10/2022   Yearly kidney health urinalysis for diabetes  01/10/2023   Complete foot exam   01/10/2023   Yearly kidney function blood test for diabetes  05/13/2023   Medicare Annual Wellness Visit  09/01/2023   DTaP/Tdap/Td vaccine (4 - Td or Tdap) 02/18/2025   Hepatitis C Screening  Completed   HIV Screening  Completed   HPV Vaccine  Aged Out    Advanced directives: Advance directive discussed with you today. I have provided a copy for you to complete at home and have notarized. Once this is complete please bring a copy in to our office so we can scan it into your chart.   Conditions/risks identified: Diabetes Mellitus and Nutrition, Adult When you have diabetes, or diabetes mellitus, it is very important to have healthy eating habits because your blood sugar (glucose) levels are greatly affected by what you eat and drink. Eating healthy foods in the right amounts, at about the same times every day, can help you: Manage your blood glucose. Lower your risk of heart disease. Improve your blood pressure. Reach or maintain a healthy weight. What can affect my meal plan? Every person with diabetes is different, and each person has different needs for a meal plan. Your health care provider may recommend that you work with a dietitian to make a meal plan  that is best for you. Your meal plan may vary depending on factors such as: The calories you need. The medicines you take. Your weight. Your blood glucose, blood pressure, and cholesterol levels. Your activity level. Other health conditions you have, such as heart or kidney disease. How do carbohydrates affect me? Carbohydrates, also called carbs, affect your blood glucose level more than any other type of food. Eating carbs raises the amount of glucose in your blood. It is important to know how many carbs you can safely have in each meal. This is different for every person. Your dietitian can help you calculate how many carbs you should have at each meal and for each snack. How does alcohol affect me? Alcohol can cause a decrease in blood glucose (hypoglycemia), especially if you use insulin or take certain diabetes medicines by mouth. Hypoglycemia can be a life-threatening condition. Symptoms of hypoglycemia, such as sleepiness, dizziness, and confusion, are similar to symptoms of having too much alcohol. Do not drink alcohol if: Your health care provider tells you not to drink. You are pregnant, may be pregnant, or are planning to become pregnant. If you drink alcohol: Limit how much you have to: 0-1 drink a day for women. 0-2 drinks a day for men. Know how much alcohol is in your drink. In the [place] one  drink equals one 12 oz bottle of beer (355 mL), one 5 oz glass of wine (148 mL), or one 1 oz glass of hard liquor (44 mL). Keep yourself hydrated with water, diet soda, or unsweetened iced tea. Keep in mind that regular soda, juice, and other mixers may contain a lot of sugar and must be counted as carbs. What are tips for following this plan?  Reading food labels Start by checking the serving size on the Nutrition Facts label of packaged foods and drinks. The number of calories and the amount of carbs, fats, and other nutrients listed on the label are based on one serving of the item.  Many items contain more than one serving per package. Check the total grams (g) of carbs in one serving. Check the number of grams of saturated fats and trans fats in one serving. Choose foods that have a low amount or none of these fats. Check the number of milligrams (mg) of salt (sodium) in one serving. Most people should limit total sodium intake to less than 2,300 mg per day. Always check the nutrition information of foods labeled as "low-fat" or "nonfat." These foods may be higher in added sugar or refined carbs and should be avoided. Talk to your dietitian to identify your daily goals for nutrients listed on the label. Shopping Avoid buying canned, pre-made, or processed foods. These foods tend to be high in fat, sodium, and added sugar. Shop around the outside edge of the grocery store. This is where you will most often find fresh fruits and vegetables, bulk grains, fresh meats, and fresh dairy products. Cooking Use low-heat cooking methods, such as baking, instead of high-heat cooking methods, such as deep frying. Cook using healthy oils, such as olive, canola, or sunflower oil. Avoid cooking with butter, cream, or high-fat meats. Meal planning Eat meals and snacks regularly, preferably at the same times every day. Avoid going long periods of time without eating. Eat foods that are high in fiber, such as fresh fruits, vegetables, beans, and whole grains. Eat 4-6 oz (112-168 g) of lean protein each day, such as lean meat, chicken, fish, eggs, or tofu. One ounce (oz) (28 g) of lean protein is equal to: 1 oz (28 g) of meat, chicken, or fish. 1 egg.  cup (62 g) of tofu. Eat some foods each day that contain healthy fats, such as avocado, nuts, seeds, and fish. What foods should I eat? Fruits Berries. Apples. Oranges. Peaches. Apricots. Plums. Grapes. Mangoes. Papayas. Pomegranates. Kiwi. Cherries. Vegetables Leafy greens, including lettuce, spinach, kale, chard, collard greens, mustard  greens, and cabbage. Beets. Cauliflower. Broccoli. Carrots. Green beans. Tomatoes. Peppers. Onions. Cucumbers. Brussels sprouts. Grains Whole grains, such as whole-wheat or whole-grain bread, crackers, tortillas, cereal, and pasta. Unsweetened oatmeal. Quinoa. Brown or wild rice. Meats and other proteins Seafood. Poultry without skin. Lean cuts of poultry and beef. Tofu. Nuts. Seeds. Dairy Low-fat or fat-free dairy products such as milk, yogurt, and cheese. The items listed above may not be a complete list of foods and beverages you can eat and drink. Contact a dietitian for more information. What foods should I avoid? Fruits Fruits canned with syrup. Vegetables Canned vegetables. Frozen vegetables with butter or cream sauce. Grains Refined white flour and flour products such as bread, pasta, snack foods, and cereals. Avoid all processed foods. Meats and other proteins Fatty cuts of meat. Poultry with skin. Breaded or fried meats. Processed meat. Avoid saturated fats. Dairy Full-fat yogurt, cheese, or milk. Beverages Sweetened  drinks, such as soda or iced tea. The items listed above may not be a complete list of foods and beverages you should avoid. Contact a dietitian for more information. Questions to ask a health care provider Do I need to meet with a certified diabetes care and education specialist? Do I need to meet with a dietitian? What number can I call if I have questions? When are the best times to check my blood glucose? Where to find more information: American Diabetes Association: diabetes.org Academy of Nutrition and Dietetics: eatright[employer] of Diabetes and Digestive and Kidney Diseases: [employer]Sync[employer]si Association of Diabetes Care & Education Specialists: diabeteseducator.org Summary It is important to have healthy eating habits because your blood sugar (glucose) levels are greatly affected by what you eat and drink. It is important to use alcohol  carefully. A healthy meal plan will help you manage your blood glucose and lower your risk of heart disease. Your health care provider may recommend that you work with a dietitian to make a meal plan that is best for you. This information is not intended to replace advice given to you by your health care provider. Make sure you discuss any questions you have with your health care provider. Document Revised: 10/22/2019 Document Reviewed: 10/22/2019 Elsevier Patient Education  2024 Elsevier Inc.   Next appointment: Follow up in one year for your annual wellness visit 09/01/23  Preventive Care 40-64 Years, Male Preventive care refers to lifestyle choices and visits with your health care provider that can promote health and wellness. What does preventive care include? A yearly physical exam. This is also called an annual well check. Dental exams once or twice a year. Routine eye exams. Ask your health care provider how often you should have your eyes checked. Personal lifestyle choices, including: Daily care of your teeth and gums. Regular physical activity. Eating a healthy diet. Avoiding tobacco and drug use. Limiting alcohol use. Practicing safe sex. Taking low-dose aspirin every day starting at age 29. What happens during an annual well check? The services and screenings done by your health care provider during your annual well check will depend on your age, overall health, lifestyle risk factors, and family history of disease. Counseling  Your health care provider may ask you questions about your: Alcohol use. Tobacco use. Drug use. Emotional well-being. Home and relationship well-being. Sexual activity. Eating habits. Work and work Astronomer. Screening  You may have the following tests or measurements: Height, weight, and BMI. Blood pressure. Lipid and cholesterol levels. These may be checked every 5 years, or more frequently if you are over 63 years old. Skin  check. Lung cancer screening. You may have this screening every year starting at age 63 if you have a 30-pack-year history of smoking and currently smoke or have quit within the past 15 years. Fecal occult blood test (FOBT) of the stool. You may have this test every year starting at age 6. Flexible sigmoidoscopy or colonoscopy. You may have a sigmoidoscopy every 5 years or a colonoscopy every 10 years starting at age 103. Prostate cancer screening. Recommendations will vary depending on your family history and other risks. Hepatitis C blood test. Hepatitis B blood test. Sexually transmitted disease (STD) testing. Diabetes screening. This is done by checking your blood sugar (glucose) after you have not eaten for a while (fasting). You may have this done every 1-3 years. Discuss your test results, treatment options, and if necessary, the need for more tests with your health care provider. Vaccines  Your health care provider may recommend certain vaccines, such as: Influenza vaccine. This is recommended every year. Tetanus, diphtheria, and acellular pertussis (Tdap, Td) vaccine. You may need a Td booster every 10 years. Zoster vaccine. You may need this after age 49. Pneumococcal 13-valent conjugate (PCV13) vaccine. You may need this if you have certain conditions and have not been vaccinated. Pneumococcal polysaccharide (PPSV23) vaccine. You may need one or two doses if you smoke cigarettes or if you have certain conditions. Talk to your health care provider about which screenings and vaccines you need and how often you need them. This information is not intended to replace advice given to you by your health care provider. Make sure you discuss any questions you have with your health care provider. Document Released: 04/16/2015 Document Revised: 12/08/2015 Document Reviewed: 01/19/2015 Elsevier Interactive Patient Education  2017 [employer].  Fall Prevention in the Home Falls can cause  injuries. They can happen to people of all ages. There are many things you can do to make your home safe and to help prevent falls. What can I do on the outside of my home? Regularly fix the edges of walkways and driveways and fix any cracks. Remove anything that might make you trip as you walk through a door, such as a raised step or threshold. Trim any bushes or trees on the path to your home. Use bright outdoor lighting. Clear any walking paths of anything that might make someone trip, such as rocks or tools. Regularly check to see if handrails are loose or broken. Make sure that both sides of any steps have handrails. Any raised decks and porches should have guardrails on the edges. Have any leaves, snow, or ice cleared regularly. Use sand or salt on walking paths during winter. Clean up any spills in your garage right away. This includes oil or grease spills. What can I do in the bathroom? Use night lights. Install grab bars by the toilet and in the tub and shower. Do not use towel bars as grab bars. Use non-skid mats or decals in the tub or shower. If you need to sit down in the shower, use a plastic, non-slip stool. Keep the floor dry. Clean up any water that spills on the floor as soon as it happens. Remove soap buildup in the tub or shower regularly. Attach bath mats securely with double-sided non-slip rug tape. Do not have throw rugs and other things on the floor that can make you trip. What can I do in the bedroom? Use night lights. Make sure that you have a light by your bed that is easy to reach. Do not use any sheets or blankets that are too big for your bed. They should not hang down onto the floor. Have a firm chair that has side arms. You can use this for support while you get dressed. Do not have throw rugs and other things on the floor that can make you trip. What can I do in the kitchen? Clean up any spills right away. Avoid walking on wet floors. Keep items that you  use a lot in easy-to-reach places. If you need to reach something above you, use a strong step stool that has a grab bar. Keep electrical cords out of the way. Do not use floor polish or wax that makes floors slippery. If you must use wax, use non-skid floor wax. Do not have throw rugs and other things on the floor that can make you trip. What can I  do with my stairs? Do not leave any items on the stairs. Make sure that there are handrails on both sides of the stairs and use them. Fix handrails that are broken or loose. Make sure that handrails are as long as the stairways. Check any carpeting to make sure that it is firmly attached to the stairs. Fix any carpet that is loose or worn. Avoid having throw rugs at the top or bottom of the stairs. If you do have throw rugs, attach them to the floor with carpet tape. Make sure that you have a light switch at the top of the stairs and the bottom of the stairs. If you do not have them, ask someone to add them for you. What else can I do to help prevent falls? Wear shoes that: Do not have high heels. Have rubber bottoms. Are comfortable and fit you well. Are closed at the toe. Do not wear sandals. If you use a stepladder: Make sure that it is fully opened. Do not climb a closed stepladder. Make sure that both sides of the stepladder are locked into place. Ask someone to hold it for you, if possible. Clearly mark and make sure that you can see: Any grab bars or handrails. First and last steps. Where the edge of each step is. Use tools that help you move around (mobility aids) if they are needed. These include: Canes. Walkers. Scooters. Crutches. Turn on the lights when you go into a dark area. Replace any light bulbs as soon as they burn out. Set up your furniture so you have a clear path. Avoid moving your furniture around. If any of your floors are uneven, fix them. If there are any pets around you, be aware of where they are. Review your  medicines with your doctor. Some medicines can make you feel dizzy. This can increase your chance of falling. Ask your doctor what other things that you can do to help prevent falls. This information is not intended to replace advice given to you by your health care provider. Make sure you discuss any questions you have with your health care provider. Document Released: 01/14/2009 Document Revised: 08/26/2015 Document Reviewed: 04/24/2014 Elsevier Interactive Patient Education  2017 [employer].

## 2022-09-01 NOTE — Progress Notes (Signed)
Subjective:   Casey Daniels is a 47 y.o. male who presents for an Initial Medicare Annual Wellness Visit.  Review of Systems    I connected with  Sandip Gottschall on 09/01/22 by a audio enabled telemedicine application and verified that I am speaking with the correct person using two identifiers.  Patient Location: Home  Provider Location: Home Office  I discussed the limitations of evaluation and management by telemedicine. The patient expressed understanding and agreed to proceed.  Cardiac Risk Factors include: advanced age (>75men, >54 women);diabetes mellitus     Objective:    Today's Vitals   09/01/22 1125  Weight: 205 lb (93 kg)   Body mass index is 25.62 kg/m.     09/01/2022   11:33 AM 09/10/2020    3:47 PM 06/18/2019    9:38 AM 03/14/2019    5:48 PM 03/10/2019    2:27 PM 03/13/2018    8:15 AM 02/26/2017   11:24 AM  Advanced Directives  Does Patient Have a Medical Advance Directive? Yes No No  No No No  Type of Estate agent of [place];Living will        Copy of Healthcare Power of Attorney in Chart? No - copy requested        Would patient like information on creating a medical advance directive?  No - Patient declined No - Patient declined No - Patient declined  No - Patient declined     Current Medications (verified) Outpatient Encounter Medications as of 09/01/2022  Medication Sig   Accu-Chek Softclix Lancets lancets Check BS twice daily and as needed Dx E11.9   atorvastatin (LIPITOR) 20 MG tablet TAKE ONE TABLET DAILY   blood glucose meter kit and supplies Check BS BID and PRN (dx E11.9)   glucose blood (GLUCOSE METER TEST) test strip Use BID   metFORMIN (GLUCOPHAGE-XR) 500 MG 24 hr tablet TAKE ONE TABLET ONCE DAILY WITH BREAKFAST   sildenafil (VIAGRA) 100 MG tablet Take 0.5-1 tablets (50-100 mg total) by mouth daily as needed for erectile dysfunction.   SYMBICORT 80-4.5 MCG/ACT inhaler 2 PUFFS 2 TIMES A DAY   No  facility-administered encounter medications on file as of 09/01/2022.    Allergies (verified) Bee venom   History: Past Medical History:  Diagnosis Date   Arthritis    bilateral ankles due to club foot release as an infant   Asthma    Diabetes mellitus without complication (HCC)    Hyperlipidemia    Past Surgical History:  Procedure Laterality Date   CLUB FOOT RELEASE Bilateral    INCISION AND DRAINAGE ABSCESS N/A 03/12/2019   Procedure: INCISION AND DRAINAGE NECK ABSCESS;  Surgeon: Newman Pies, MD;  Location: [facility] OR;  Service: ENT;  Laterality: N/A;   Family History  Problem Relation Age of Onset   Diabetes Mother    Healthy Brother    Social History   Socioeconomic History   Marital status: Single    Spouse name: Not on file   Number of children: 0   Years of education: 12   Highest education level: High school graduate  Occupational History   Occupation: unemployed    Comment: disabled  Tobacco Use   Smoking status: Every Day    Packs/day: 0.25    Years: 15.00    Additional pack years: 0.00    Total pack years: 3.75    Types: Cigarettes   Smokeless tobacco: Never   Tobacco comments:    quits off and on  Vaping Use   Vaping Use: Never used  Substance and Sexual Activity   Alcohol use: Yes    Alcohol/week: 2.0 standard drinks of alcohol    Types: 2 Cans of beer per week   Drug use: Yes    Types: Marijuana   Sexual activity: Not Currently    Birth control/protection: None  Other Topics Concern   Not on file  Social History Narrative   Lives with his brother. No vehicle - rides his bike or walks to local appointments - Gets [facility] if appt out of town   Social Determinants of Health   Financial Resource Strain: Low Risk  (09/01/2022)   Overall Financial Resource Strain (CARDIA)    Difficulty of Paying Living Expenses: Not hard at all  Food Insecurity: No Food Insecurity (09/01/2022)   Hunger Vital Sign    Worried About Running Out of Food in the Last Year:  Never true    Ran Out of Food in the Last Year: Never true  Transportation Needs: No Transportation Needs (09/01/2022)   PRAPARE - Administrator, Civil Service (Medical): No    Lack of Transportation (Non-Medical): No  Physical Activity: Sufficiently Active (09/01/2022)   Exercise Vital Sign    Days of Exercise per Week: 7 days    Minutes of Exercise per Session: 30 min  Stress: No Stress Concern Present (09/01/2022)   [employer] of Occupational Health - Occupational Stress Questionnaire    Feeling of Stress : Not at all  Social Connections: Unknown (09/01/2022)   Social Connection and Isolation Panel [NHANES]    Frequency of Communication with Friends and Family: Twice a week    Frequency of Social Gatherings with Friends and Family: Twice a week    Attends Religious Services: Patient unable to answer    Active Member of Clubs or Organizations: Patient unable to answer    Attends Banker Meetings: 1 to 4 times per year    Marital Status: Patient unable to answer    Tobacco Counseling Ready to quit: Not Answered Counseling given: Yes Tobacco comments: quits off and on   Clinical Intake:  Pre-visit preparation completed: Yes  Pain : No/denies pain     BMI - recorded: 25.62 Nutritional Status: BMI 25 -29 Overweight Nutritional Risks: None Diabetes: Yes CBG done?: No Did pt. bring in CBG monitor from home?: No  How often do you need to have someone help you when you read instructions, pamphlets, or other written materials from your doctor or pharmacy?: 1 - Never  Diabetic?yes  Interpreter Needed?: No  Information entered by :: Fredirick Maudlin   Activities of Daily Living    09/01/2022   11:34 AM  In your present state of health, do you have any difficulty performing the following activities:  Hearing? 0  Vision? 0  Difficulty concentrating or making decisions? 0  Walking or climbing stairs? 0  Dressing or bathing? 0  Doing  errands, shopping? 0  Preparing Food and eating ? N  Using the Toilet? N  In the past six months, have you accidently leaked urine? N  Do you have problems with loss of bowel control? N  Managing your Medications? N  Managing your Finances? N  Housekeeping or managing your Housekeeping? N    Patient Care Team: Junie Spencer, FNP as PCP - General (Family Medicine) Lanelle Bal, DO as Consulting Physician (Internal Medicine) Michaelle Copas, MD as Referring Physician [facility])  Indicate any recent  Medical Services you may have received from other than [facility] providers in the past year (date may be approximate).     Assessment:   This is a routine wellness examination for Yao.  Hearing/Vision screen Hearing Screening - Comments:: Denies hearing difficulties   Vision Screening - Comments:: Wears rx glasses - up to date with routine eye exams with  [facility]   Dietary issues and exercise activities discussed: Current Exercise Habits: Home exercise routine, Type of exercise: Other - see comments (riding bike), Time (Minutes): 60, Frequency (Times/Week): 7, Weekly Exercise (Minutes/Week): 420, Intensity: Moderate   Goals Addressed             This Visit's Progress    DIET - INCREASE WATER INTAKE   On track      Depression Screen    09/01/2022   11:28 AM 05/12/2022    1:59 PM 08/02/2021    9:49 AM 09/10/2020    3:36 PM 06/11/2020    3:02 PM 12/12/2019    8:58 AM 09/16/2019   10:00 AM  PHQ 2/9 Scores  PHQ - 2 Score 0 0 0 1 0 0 0  PHQ- 9 Score  0 3        Fall Risk    09/01/2022   11:34 AM 05/12/2022    1:58 PM 08/02/2021    9:48 AM 09/10/2020    3:46 PM 12/12/2019    8:57 AM  Fall Risk   Falls in the past year? 0 0 0 0 0  Number falls in past yr: 0   0   Injury with Fall? 0   0   Risk for fall due to : No Fall Risks   Impaired vision;Orthopedic patient   Follow up Falls prevention discussed;Falls evaluation completed  Falls evaluation completed Falls  prevention discussed     FALL RISK PREVENTION PERTAINING TO THE HOME:  Any stairs in or around the home? No  If so, are there any without handrails? No  Home free of loose throw rugs in walkways, pet beds, electrical cords, etc? No  Adequate lighting in your home to reduce risk of falls? Yes   ASSISTIVE DEVICES UTILIZED TO PREVENT FALLS:  Life alert? No  Use of a cane, walker or w/c? No  Grab bars in the bathroom? No  Shower chair or bench in shower? No  Elevated toilet seat or a handicapped toilet? No   TIMED UP AND GO:  Was the test performed? No . televisit   Cognitive Function:    03/13/2018    8:17 AM 02/26/2017   11:25 AM  MMSE - Mini Mental State Exam  Not completed:  Unable to complete  Orientation to time 5   Orientation to Place 5   Registration 3   Attention/ Calculation 5   Recall 3   Language- name 2 objects 2   Language- repeat 1   Language- follow 3 step command 3   Language- read & follow direction 1   Write a sentence 1   Copy design 1   Total score 30         09/01/2022   11:29 AM 09/10/2020    3:47 PM 06/18/2019    9:44 AM  6CIT Screen  What Year? 0 points 0 points 0 points  What month? 0 points 0 points 0 points  What time? 0 points 0 points 0 points  Count back from 20 0 points 0 points 0 points  Months in reverse 0 points  0 points 0 points  Repeat phrase 2 points 0 points 0 points  Total Score 2 points 0 points 0 points    Immunizations Immunization History  Administered Date(s) Administered   Influenza,inj,Quad PF,6+ Mos 01/29/2017, 02/19/2018, 01/07/2019, 01/14/2021, 05/12/2022   Influenza,trivalent, recombinat, inj, PF 04/17/2015   Moderna Sars-Covid-2 Vaccination 07/10/2019, 08/07/2019   Pneumococcal Polysaccharide-23 09/16/2019   Td (Adult),5 Lf Tetanus Toxid, Preservative Free 10/22/2006   Tdap 10/22/2006, 02/19/2015    TDAP status: Up to date  Flu Vaccine status: Up to date  Pneumococcal vaccine status: Up to  date  Covid-19 vaccine status: Information provided on how to obtain vaccines.   Qualifies for Shingles Vaccine? No   Zostavax completed No   Shingrix Completed?: No.    Education has been provided regarding the importance of this vaccine. Patient has been advised to call insurance company to determine out of pocket expense if they have not yet received this vaccine. Advised may also receive vaccine at local pharmacy or Health Dept. Verbalized acceptance and understanding.  Screening Tests Health Maintenance  Topic Date Due   OPHTHALMOLOGY EXAM  Never done   Colonoscopy  Never done   COVID-19 Vaccine (3 - 2023-24 season) 12/02/2021   INFLUENZA VACCINE  11/02/2022   HEMOGLOBIN A1C  11/10/2022   Diabetic kidney evaluation - Urine ACR  01/10/2023   FOOT EXAM  01/10/2023   Diabetic kidney evaluation - eGFR measurement  05/13/2023   Medicare Annual Wellness (AWV)  09/01/2023   DTaP/Tdap/Td (4 - Td or Tdap) 02/18/2025   Hepatitis C Screening  Completed   HIV Screening  Completed   HPV VACCINES  Aged Out    Health Maintenance  Health Maintenance Due  Topic Date Due   OPHTHALMOLOGY EXAM  Never done   Colonoscopy  Never done   COVID-19 Vaccine (3 - 2023-24 season) 12/02/2021    Colorectal cancer screening: Referral to GI placed will discuss at appointment. Pt aware the office will call re: appt.  Lung Cancer Screening: (Low Dose CT Chest recommended if Age 40-80 years, 30 pack-year currently smoking OR have quit w/in 15years.) does qualify.   Lung Cancer Screening Referral: yes  Additional Screening:  Hepatitis C Screening: does not qualify;age out  Vision Screening: Recommended annual ophthalmology exams for early detection of glaucoma and other disorders of the eye. Is the patient up to date with their annual eye exam?  Yes  Who is the provider or what is the name of the office in which the patient attends annual eye exams? [facility]  If pt is not established with a  provider, would they like to be referred to a provider to establish care? No .   Dental Screening: Recommended annual dental exams for proper oral hygiene  Community Resource Referral / Chronic Care Management: CRR required this visit?  No   CCM required this visit?  No      Plan:     I have personally reviewed and noted the following in the patient's chart:   Medical and social history Use of alcohol, tobacco or illicit drugs  Current medications and supplements including opioid prescriptions. Patient is not currently taking opioid prescriptions. Functional ability and status Nutritional status Physical activity Advanced directives List of other physicians Hospitalizations, surgeries, and ER visits in previous 12 months Vitals Screenings to include cognitive, depression, and falls Referrals and appointments  In addition, I have reviewed and discussed with patient certain preventive protocols, quality metrics, and best practice recommendations. A written personalized  care plan for preventive services as well as general preventive health recommendations were provided to patient.     Annabell Sabal, CMA   09/01/2022   Nurse Notes: None

## 2022-09-11 ENCOUNTER — Ambulatory Visit (INDEPENDENT_AMBULATORY_CARE_PROVIDER_SITE_OTHER): Payer: 59 | Admitting: Family

## 2022-09-11 ENCOUNTER — Encounter: Payer: Self-pay | Admitting: Family

## 2022-09-11 ENCOUNTER — Other Ambulatory Visit: Payer: 59

## 2022-09-11 VITALS — BP 131/88 | HR 80 | Temp 98.0°F | Ht 75.0 in | Wt 205.0 lb

## 2022-09-11 DIAGNOSIS — E785 Hyperlipidemia, unspecified: Secondary | ICD-10-CM

## 2022-09-11 DIAGNOSIS — E559 Vitamin D deficiency, unspecified: Secondary | ICD-10-CM | POA: Diagnosis not present

## 2022-09-11 DIAGNOSIS — N528 Other male erectile dysfunction: Secondary | ICD-10-CM

## 2022-09-11 DIAGNOSIS — Z87891 Personal history of nicotine dependence: Secondary | ICD-10-CM | POA: Diagnosis not present

## 2022-09-11 DIAGNOSIS — E1169 Type 2 diabetes mellitus with other specified complication: Secondary | ICD-10-CM

## 2022-09-11 DIAGNOSIS — J4541 Moderate persistent asthma with (acute) exacerbation: Secondary | ICD-10-CM

## 2022-09-11 NOTE — Patient Instructions (Signed)
Health Maintenance, Male Adopting a healthy lifestyle and getting preventive care are important in promoting health and wellness. Ask your health care provider about: The right schedule for you to have regular tests and exams. Things you can do on your own to prevent diseases and keep yourself healthy. What should I know about diet, weight, and exercise? Eat a healthy diet  Eat a diet that includes plenty of vegetables, fruits, low-fat dairy products, and lean protein. Do not eat a lot of foods that are high in solid fats, added sugars, or sodium. Maintain a healthy weight Body mass index (BMI) is a measurement that can be used to identify possible weight problems. It estimates body fat based on height and weight. Your health care provider can help determine your BMI and help you achieve or maintain a healthy weight. Get regular exercise Get regular exercise. This is one of the most important things you can do for your health. Most adults should: Exercise for at least 150 minutes each week. The exercise should increase your heart rate and make you sweat (moderate-intensity exercise). Do strengthening exercises at least twice a week. This is in addition to the moderate-intensity exercise. Spend less time sitting. Even light physical activity can be beneficial. Watch cholesterol and blood lipids Have your blood tested for lipids and cholesterol at 48 years of age, then have this test every 5 years. You may need to have your cholesterol levels checked more often if: Your lipid or cholesterol levels are high. You are older than 48 years of age. You are at high risk for heart disease. What should I know about cancer screening? Many types of cancers can be detected early and may often be prevented. Depending on your health history and family history, you may need to have cancer screening at various ages. This may include screening for: Colorectal cancer. Prostate cancer. Skin cancer. Lung  cancer. What should I know about heart disease, diabetes, and high blood pressure? Blood pressure and heart disease High blood pressure causes heart disease and increases the risk of stroke. This is more likely to develop in people who have high blood pressure readings or are overweight. Talk with your health care provider about your target blood pressure readings. Have your blood pressure checked: Every 3-5 years if you are 18-39 years of age. Every year if you are 40 years old or older. If you are between the ages of 65 and 75 and are a current or former smoker, ask your health care provider if you should have a one-time screening for abdominal aortic aneurysm (AAA). Diabetes Have regular diabetes screenings. This checks your fasting blood sugar level. Have the screening done: Once every three years after age 45 if you are at a normal weight and have a low risk for diabetes. More often and at a younger age if you are overweight or have a high risk for diabetes. What should I know about preventing infection? Hepatitis B If you have a higher risk for hepatitis B, you should be screened for this virus. Talk with your health care provider to find out if you are at risk for hepatitis B infection. Hepatitis C Blood testing is recommended for: Everyone born from 1945 through 1965. Anyone with known risk factors for hepatitis C. Sexually transmitted infections (STIs) You should be screened each year for STIs, including gonorrhea and chlamydia, if: You are sexually active and are younger than 48 years of age. You are older than 48 years of age and your   health care provider tells you that you are at risk for this type of infection. Your sexual activity has changed since you were last screened, and you are at increased risk for chlamydia or gonorrhea. Ask your health care provider if you are at risk. Ask your health care provider about whether you are at high risk for HIV. Your health care provider  may recommend a prescription medicine to help prevent HIV infection. If you choose to take medicine to prevent HIV, you should first get tested for HIV. You should then be tested every 3 months for as long as you are taking the medicine. Follow these instructions at home: Alcohol use Do not drink alcohol if your health care provider tells you not to drink. If you drink alcohol: Limit how much you have to 0-2 drinks a day. Know how much alcohol is in your drink. In the [place] one drink equals one 12 oz bottle of beer (355 mL), one 5 oz glass of wine (148 mL), or one 1 oz glass of hard liquor (44 mL). Lifestyle Do not use any products that contain nicotine or tobacco. These products include cigarettes, chewing tobacco, and vaping devices, such as e-cigarettes. If you need help quitting, ask your health care provider. Do not use street drugs. Do not share needles. Ask your health care provider for help if you need support or information about quitting drugs. General instructions Schedule regular health, dental, and eye exams. Stay current with your vaccines. Tell your health care provider if: You often feel depressed. You have ever been abused or do not feel safe at home. Summary Adopting a healthy lifestyle and getting preventive care are important in promoting health and wellness. Follow your health care provider's instructions about healthy diet, exercising, and getting tested or screened for diseases. Follow your health care provider's instructions on monitoring your cholesterol and blood pressure. This information is not intended to replace advice given to you by your health care provider. Make sure you discuss any questions you have with your health care provider. Document Revised: 08/09/2020 Document Reviewed: 08/09/2020 Elsevier Patient Education  2024 Elsevier Inc.  

## 2022-09-11 NOTE — Progress Notes (Signed)
Subjective:    Patient ID: Casey Daniels, male    DOB: Nov 30, 1975, 47 y.o.   MRN: [id number]  Chief Complaint  Patient presents with   Medical Management of Chronic Issues   Pt presents to the office today for chronic follow up.  He reports he quit alcohol and smoking 05/07/22.   He is complaining intermittent ED. He has taken Viagra with mild relief. He is wanting to have it enlarged. Requesting referral to Urologists.  Asthma He complains of cough and wheezing. There is no shortness of breath. This is a chronic problem. The current episode started more than 1 year ago. The problem occurs intermittently. His symptoms are alleviated by rest. He reports moderate improvement on treatment. His past medical history is significant for asthma.  Diabetes He presents for his follow-up diabetic visit. He has type 2 diabetes mellitus. Pertinent negatives for diabetes include no blurred vision and no foot paresthesias. Symptoms are stable. Diabetic complications include peripheral neuropathy. Risk factors for coronary artery disease include dyslipidemia, diabetes mellitus, hypertension, male sex and sedentary lifestyle. He is following a generally healthy diet. His overall blood glucose range is 70-90 mg/dl.  Hyperlipidemia This is a chronic problem. The current episode started more than 1 year ago. The problem is controlled. Pertinent negatives include no shortness of breath. Current antihyperlipidemic treatment includes statins. The current treatment provides moderate improvement of lipids. Risk factors for coronary artery disease include dyslipidemia, diabetes mellitus, hypertension, male sex and a sedentary lifestyle.      Review of Systems  Eyes:  Negative for blurred vision.  Respiratory:  Positive for cough and wheezing. Negative for shortness of breath.   All other systems reviewed and are negative.      Objective:   Physical Exam Vitals reviewed.  Constitutional:      General:  He is not in acute distress.    Appearance: He is well-developed.  HENT:     Head: Normocephalic.     Right Ear: Tympanic membrane normal.     Left Ear: Tympanic membrane normal.  Eyes:     General:        Right eye: No discharge.        Left eye: No discharge.     Pupils: Pupils are equal, round, and reactive to light.  Neck:     Thyroid: No thyromegaly.  Cardiovascular:     Rate and Rhythm: Normal rate and regular rhythm.     Heart sounds: Normal heart sounds. No murmur heard. Pulmonary:     Effort: Pulmonary effort is normal. No respiratory distress.     Breath sounds: Normal breath sounds. No wheezing.  Abdominal:     General: Bowel sounds are normal. There is no distension.     Palpations: Abdomen is soft.     Tenderness: There is no abdominal tenderness.  Musculoskeletal:        General: No tenderness. Normal range of motion.     Cervical back: Normal range of motion and neck supple.  Skin:    General: Skin is warm and dry.     Findings: No erythema or rash.  Neurological:     Mental Status: He is alert and oriented to person, place, and time.     Cranial Nerves: No cranial nerve deficit.     Deep Tendon Reflexes: Reflexes are normal and symmetric.  Psychiatric:        Behavior: Behavior normal.        Thought Content: Thought content  normal.        Judgment: Judgment normal.       BP 131/88   Pulse 80   Temp 98 F (36.7 C) (Temporal)   Ht 6\' 3"  (1.905 m)   Wt 205 lb (93 kg)   SpO2 94%   BMI 25.62 kg/m      Assessment & Plan:  Coral Soler comes in today with chief complaint of Medical Management of Chronic Issues   Diagnosis and orders addressed:  1. Moderate persistent asthma with acute exacerbation - CMP14+EGFR  2. Type 2 diabetes mellitus with other specified complication, without long-term current use of insulin (HCC) - CMP14+EGFR - Bayer DCA Hb A1c Waived  3. Vitamin D deficiency - CMP14+EGFR  4. Hyperlipidemia associated with  type 2 diabetes mellitus (HCC) - CMP14+EGFR  5. Former smoker - CMP14+EGFR  6. Other male erectile dysfunction - Ambulatory referral to Urology - CMP14+EGFR   Labs pending Health Maintenance reviewed Diet and exercise encouraged  Follow up plan: 4 months    Jannifer Rodney, FNP

## 2022-09-12 ENCOUNTER — Other Ambulatory Visit: Payer: 59

## 2022-09-12 DIAGNOSIS — E1169 Type 2 diabetes mellitus with other specified complication: Secondary | ICD-10-CM | POA: Diagnosis not present

## 2022-09-12 DIAGNOSIS — Z87891 Personal history of nicotine dependence: Secondary | ICD-10-CM | POA: Diagnosis not present

## 2022-09-12 DIAGNOSIS — E785 Hyperlipidemia, unspecified: Secondary | ICD-10-CM | POA: Diagnosis not present

## 2022-09-12 DIAGNOSIS — E559 Vitamin D deficiency, unspecified: Secondary | ICD-10-CM | POA: Diagnosis not present

## 2022-09-12 DIAGNOSIS — J4541 Moderate persistent asthma with (acute) exacerbation: Secondary | ICD-10-CM | POA: Diagnosis not present

## 2022-09-12 LAB — BAYER DCA HB A1C WAIVED: HB A1C (BAYER DCA - WAIVED): 5.8 % — ABNORMAL HIGH (ref 4.8–5.6)

## 2022-09-13 LAB — CMP14+EGFR
ALT: 28 IU/L (ref 0–44)
AST: 30 IU/L (ref 0–40)
Albumin/Globulin Ratio: 2
Albumin: 4.5 g/dL (ref 4.1–5.1)
Alkaline Phosphatase: 59 IU/L (ref 44–121)
BUN/Creatinine Ratio: 16 (ref 9–20)
BUN: 21 mg/dL (ref 6–24)
Bilirubin Total: 0.4 mg/dL (ref 0.0–1.2)
CO2: 22 mmol/L (ref 20–29)
Calcium: 9.7 mg/dL (ref 8.7–10.2)
Chloride: 104 mmol/L (ref 96–106)
Creatinine, Ser: 1.29 mg/dL — ABNORMAL HIGH (ref 0.76–1.27)
Globulin, Total: 2.3 g/dL (ref 1.5–4.5)
Glucose: 113 mg/dL — ABNORMAL HIGH (ref 70–99)
Potassium: 4.6 mmol/L (ref 3.5–5.2)
Sodium: 140 mmol/L (ref 134–144)
Total Protein: 6.8 g/dL (ref 6.0–8.5)
eGFR: 68 mL/min/{1.73_m2} (ref >59)

## 2022-09-18 ENCOUNTER — Ambulatory Visit: Payer: 59 | Admitting: Urology

## 2022-12-15 ENCOUNTER — Telehealth: Payer: Self-pay | Admitting: Family

## 2022-12-15 NOTE — Telephone Encounter (Signed)
Patient reports he is no longer going to take Metformin because it is making him feel weak and shaky.  He said last time he took it was about 2-3 weeks ago.  He has not checked his blood sugars.  Patient was advised that he needs to keep track of blood sugars.

## 2022-12-15 NOTE — Telephone Encounter (Signed)
Patient aware and verbalized understanding. °

## 2022-12-15 NOTE — Telephone Encounter (Signed)
Patient thinks he is having a reaction to metformin. Said he feels like he is going to "fall out" and feels shaky. Please call back and advise.

## 2022-12-15 NOTE — Telephone Encounter (Signed)
Ok to hold. His last A1C at goal.   Jannifer Rodney, FNP

## 2022-12-19 ENCOUNTER — Ambulatory Visit: Payer: 59

## 2023-01-12 ENCOUNTER — Encounter: Payer: Self-pay | Admitting: Family

## 2023-01-12 ENCOUNTER — Ambulatory Visit: Payer: 59 | Admitting: Family

## 2023-01-12 VITALS — BP 128/73 | HR 76 | Temp 98.1°F | Ht 75.0 in | Wt 208.8 lb

## 2023-01-12 DIAGNOSIS — J4541 Moderate persistent asthma with (acute) exacerbation: Secondary | ICD-10-CM | POA: Diagnosis not present

## 2023-01-12 DIAGNOSIS — E785 Hyperlipidemia, unspecified: Secondary | ICD-10-CM | POA: Diagnosis not present

## 2023-01-12 DIAGNOSIS — Z23 Encounter for immunization: Secondary | ICD-10-CM

## 2023-01-12 DIAGNOSIS — E1169 Type 2 diabetes mellitus with other specified complication: Secondary | ICD-10-CM | POA: Diagnosis not present

## 2023-01-12 DIAGNOSIS — Z0000 Encounter for general adult medical examination without abnormal findings: Secondary | ICD-10-CM

## 2023-01-12 DIAGNOSIS — E559 Vitamin D deficiency, unspecified: Secondary | ICD-10-CM | POA: Diagnosis not present

## 2023-01-12 DIAGNOSIS — Z0001 Encounter for general adult medical examination with abnormal findings: Secondary | ICD-10-CM | POA: Diagnosis not present

## 2023-01-12 DIAGNOSIS — Z87891 Personal history of nicotine dependence: Secondary | ICD-10-CM | POA: Diagnosis not present

## 2023-01-12 DIAGNOSIS — Z1211 Encounter for screening for malignant neoplasm of colon: Secondary | ICD-10-CM

## 2023-01-12 LAB — BAYER DCA HB A1C WAIVED: HB A1C (BAYER DCA - WAIVED): 5.7 % — ABNORMAL HIGH (ref 4.8–5.6)

## 2023-01-12 NOTE — Patient Instructions (Signed)
Health Maintenance, Male Adopting a healthy lifestyle and getting preventive care are important in promoting health and wellness. Ask your health care provider about: The right schedule for you to have regular tests and exams. Things you can do on your own to prevent diseases and keep yourself healthy. What should I know about diet, weight, and exercise? Eat a healthy diet  Eat a diet that includes plenty of vegetables, fruits, low-fat dairy products, and lean protein. Do not eat a lot of foods that are high in solid fats, added sugars, or sodium. Maintain a healthy weight Body mass index (BMI) is a measurement that can be used to identify possible weight problems. It estimates body fat based on height and weight. Your health care provider can help determine your BMI and help you achieve or maintain a healthy weight. Get regular exercise Get regular exercise. This is one of the most important things you can do for your health. Most adults should: Exercise for at least 150 minutes each week. The exercise should increase your heart rate and make you sweat (moderate-intensity exercise). Do strengthening exercises at least twice a week. This is in addition to the moderate-intensity exercise. Spend less time sitting. Even light physical activity can be beneficial. Watch cholesterol and blood lipids Have your blood tested for lipids and cholesterol at 48 years of age, then have this test every 5 years. You may need to have your cholesterol levels checked more often if: Your lipid or cholesterol levels are high. You are older than 48 years of age. You are at high risk for heart disease. What should I know about cancer screening? Many types of cancers can be detected early and may often be prevented. Depending on your health history and family history, you may need to have cancer screening at various ages. This may include screening for: Colorectal cancer. Prostate cancer. Skin cancer. Lung  cancer. What should I know about heart disease, diabetes, and high blood pressure? Blood pressure and heart disease High blood pressure causes heart disease and increases the risk of stroke. This is more likely to develop in people who have high blood pressure readings or are overweight. Talk with your health care provider about your target blood pressure readings. Have your blood pressure checked: Every 3-5 years if you are 18-39 years of age. Every year if you are 40 years old or older. If you are between the ages of 65 and 75 and are a current or former smoker, ask your health care provider if you should have a one-time screening for abdominal aortic aneurysm (AAA). Diabetes Have regular diabetes screenings. This checks your fasting blood sugar level. Have the screening done: Once every three years after age 45 if you are at a normal weight and have a low risk for diabetes. More often and at a younger age if you are overweight or have a high risk for diabetes. What should I know about preventing infection? Hepatitis B If you have a higher risk for hepatitis B, you should be screened for this virus. Talk with your health care provider to find out if you are at risk for hepatitis B infection. Hepatitis C Blood testing is recommended for: Everyone born from 1945 through 1965. Anyone with known risk factors for hepatitis C. Sexually transmitted infections (STIs) You should be screened each year for STIs, including gonorrhea and chlamydia, if: You are sexually active and are younger than 48 years of age. You are older than 48 years of age and your   health care provider tells you that you are at risk for this type of infection. Your sexual activity has changed since you were last screened, and you are at increased risk for chlamydia or gonorrhea. Ask your health care provider if you are at risk. Ask your health care provider about whether you are at high risk for HIV. Your health care provider  may recommend a prescription medicine to help prevent HIV infection. If you choose to take medicine to prevent HIV, you should first get tested for HIV. You should then be tested every 3 months for as long as you are taking the medicine. Follow these instructions at home: Alcohol use Do not drink alcohol if your health care provider tells you not to drink. If you drink alcohol: Limit how much you have to 0-2 drinks a day. Know how much alcohol is in your drink. In the [place] one drink equals one 12 oz bottle of beer (355 mL), one 5 oz glass of wine (148 mL), or one 1 oz glass of hard liquor (44 mL). Lifestyle Do not use any products that contain nicotine or tobacco. These products include cigarettes, chewing tobacco, and vaping devices, such as e-cigarettes. If you need help quitting, ask your health care provider. Do not use street drugs. Do not share needles. Ask your health care provider for help if you need support or information about quitting drugs. General instructions Schedule regular health, dental, and eye exams. Stay current with your vaccines. Tell your health care provider if: You often feel depressed. You have ever been abused or do not feel safe at home. Summary Adopting a healthy lifestyle and getting preventive care are important in promoting health and wellness. Follow your health care provider's instructions about healthy diet, exercising, and getting tested or screened for diseases. Follow your health care provider's instructions on monitoring your cholesterol and blood pressure. This information is not intended to replace advice given to you by your health care provider. Make sure you discuss any questions you have with your health care provider. Document Revised: 08/09/2020 Document Reviewed: 08/09/2020 Elsevier Patient Education  2024 Elsevier Inc.  

## 2023-01-12 NOTE — Addendum Note (Signed)
Addended by: Jannifer Rodney A on: 01/12/2023 02:30 PM   Modules accepted: Level of Service

## 2023-01-12 NOTE — Progress Notes (Signed)
Subjective:    Patient ID: Casey Daniels, male    DOB: 1976/03/07, 47 y.o.   MRN: [id number]  Chief Complaint  Patient presents with   Medical Management of Chronic Issues    4 month   Pt presents to the office today for CPE and chronic follow up.  He reports he quit alcohol and smoking 05/07/22. Has stopped his Symbicort since stopping smoking.    He is complaining intermittent ED. He has taken Viagra with mild relief.  Asthma There is no cough, shortness of breath or wheezing. This is a chronic problem. The current episode started more than 1 year ago. The problem occurs intermittently. His symptoms are alleviated by rest and OTC cough suppressant. He reports moderate improvement on treatment. His past medical history is significant for asthma.  Hyperlipidemia This is a chronic problem. The current episode started more than 1 year ago. The problem is controlled. Recent lipid tests were reviewed and are normal. Pertinent negatives include no shortness of breath. Current antihyperlipidemic treatment includes statins. The current treatment provides moderate improvement of lipids. Risk factors for coronary artery disease include dyslipidemia, diabetes mellitus, hypertension, a sedentary lifestyle and post-menopausal.  Diabetes He presents for his follow-up diabetic visit. He has type 2 diabetes mellitus. Pertinent negatives for diabetes include no blurred vision and no foot paresthesias. Symptoms are stable. Risk factors for coronary artery disease include dyslipidemia, diabetes mellitus, hypertension and sedentary lifestyle. He is following a generally healthy diet. His overall blood glucose range is 110-130 mg/dl. Eye exam is not current.      Review of Systems  Eyes:  Negative for blurred vision.  Respiratory:  Negative for cough, shortness of breath and wheezing.   All other systems reviewed and are negative.  Family History  Problem Relation Age of Onset   Diabetes Mother     Healthy Brother    Social History   Socioeconomic History   Marital status: Single    Spouse name: Not on file   Number of children: 0   Years of education: 12   Highest education level: High school graduate  Occupational History   Occupation: unemployed    Comment: disabled  Tobacco Use   Smoking status: Former    Current packs/day: 0.00    Average packs/day: 0.3 packs/day for 15.0 years (3.8 ttl pk-yrs)    Types: Cigarettes    Quit date: 05/07/2022    Years since quitting: 0.6   Smokeless tobacco: Never   Tobacco comments:    quits off and on  Vaping Use   Vaping status: Never Used  Substance and Sexual Activity   Alcohol use: Yes    Alcohol/week: 2.0 standard drinks of alcohol    Types: 2 Cans of beer per week   Drug use: Yes    Types: Marijuana   Sexual activity: Not Currently    Birth control/protection: None  Other Topics Concern   Not on file  Social History Narrative   Lives with his brother. No vehicle - rides his bike or walks to local appointments - Gets [facility] if appt out of town   Social Determinants of Health   Financial Resource Strain: Low Risk  (09/01/2022)   Overall Financial Resource Strain (CARDIA)    Difficulty of Paying Living Expenses: Not hard at all  Food Insecurity: No Food Insecurity (09/01/2022)   Hunger Vital Sign    Worried About Running Out of Food in the Last Year: Never true    Ran Out  of Food in the Last Year: Never true  Transportation Needs: No Transportation Needs (09/01/2022)   PRAPARE - Administrator, Civil Service (Medical): No    Lack of Transportation (Non-Medical): No  Physical Activity: Sufficiently Active (09/01/2022)   Exercise Vital Sign    Days of Exercise per Week: 7 days    Minutes of Exercise per Session: 30 min  Stress: No Stress Concern Present (09/01/2022)   [employer] of Occupational Health - Occupational Stress Questionnaire    Feeling of Stress : Not at all  Social Connections: Unknown  (09/01/2022)   Social Connection and Isolation Panel [NHANES]    Frequency of Communication with Friends and Family: Twice a week    Frequency of Social Gatherings with Friends and Family: Twice a week    Attends Religious Services: Patient unable to answer    Active Member of Clubs or Organizations: Patient unable to answer    Attends Banker Meetings: 1 to 4 times per year    Marital Status: Patient unable to answer        Objective:   Physical Exam Vitals reviewed.  Constitutional:      General: He is not in acute distress.    Appearance: He is well-developed.  HENT:     Head: Normocephalic.     Right Ear: Tympanic membrane normal.     Left Ear: Tympanic membrane normal.  Eyes:     General:        Right eye: No discharge.        Left eye: No discharge.     Pupils: Pupils are equal, round, and reactive to light.  Neck:     Thyroid: No thyromegaly.  Cardiovascular:     Rate and Rhythm: Normal rate and regular rhythm.     Heart sounds: Normal heart sounds. No murmur heard. Pulmonary:     Effort: Pulmonary effort is normal. No respiratory distress.     Breath sounds: Normal breath sounds. No wheezing.  Abdominal:     General: Bowel sounds are normal. There is no distension.     Palpations: Abdomen is soft.     Tenderness: There is no abdominal tenderness.  Musculoskeletal:        General: No tenderness. Normal range of motion.     Cervical back: Normal range of motion and neck supple.  Skin:    General: Skin is warm and dry.     Findings: No erythema or rash.  Neurological:     Mental Status: He is alert and oriented to person, place, and time.     Cranial Nerves: No cranial nerve deficit.     Deep Tendon Reflexes: Reflexes are normal and symmetric.  Psychiatric:        Behavior: Behavior normal.        Thought Content: Thought content normal.        Judgment: Judgment normal.       BP 128/73   Pulse 76   Temp 98.1 F (36.7 C) (Temporal)   Ht  6\' 3"  (1.905 m)   Wt 208 lb 12.8 oz (94.7 kg)   SpO2 97%   BMI 26.10 kg/m      Assessment & Plan:  Casey Daniels comes in today with chief complaint of Medical Management of Chronic Issues (4 month)   Diagnosis and orders addressed:  1. Type 2 diabetes mellitus with other specified complication, without long-term current use of insulin (HCC) - Microalbumin / creatinine urine  ratio - CBC with Differential/Platelet - Bayer DCA Hb A1c Waived - CMP14+EGFR  2. Moderate persistent asthma with acute exacerbation - CBC with Differential/Platelet - CMP14+EGFR  3. Former smoker - CBC with Differential/Platelet - CMP14+EGFR  4. Hyperlipidemia associated with type 2 diabetes mellitus (HCC) - CBC with Differential/Platelet - CMP14+EGFR - Lipid panel  5. Vitamin D deficiency - CBC with Differential/Platelet - CMP14+EGFR  6. Annual physical exam - Microalbumin / creatinine urine ratio - CBC with Differential/Platelet - Bayer DCA Hb A1c Waived - CMP14+EGFR - Lipid panel - PSA, total and free - VITAMIN D 25 Hydroxy (Vit-D Deficiency, Fractures)  7. Colon cancer screening - Ambulatory referral to Gastroenterology   Labs pending Health Maintenance reviewed Diet and exercise encouraged  Follow up plan: 6 months   Jannifer Rodney, FNP

## 2023-01-13 LAB — LIPID PANEL
Chol/HDL Ratio: 2.1 {ratio} (ref 0.0–5.0)
Cholesterol, Total: 157 mg/dL (ref 100–199)
HDL: 74 mg/dL (ref >39)
LDL Chol Calc (NIH): 70 mg/dL (ref 0–99)
Triglycerides: 69 mg/dL (ref 0–149)
VLDL Cholesterol Cal: 13 mg/dL (ref 5–40)

## 2023-01-13 LAB — CMP14+EGFR
ALT: 38 [IU]/L (ref 0–44)
AST: 32 [IU]/L (ref 0–40)
Albumin: 4.9 g/dL (ref 4.1–5.1)
Alkaline Phosphatase: 68 [IU]/L (ref 44–121)
BUN/Creatinine Ratio: 16 (ref 9–20)
BUN: 21 mg/dL (ref 6–24)
Bilirubin Total: 0.5 mg/dL (ref 0.0–1.2)
CO2: 25 mmol/L (ref 20–29)
Calcium: 10.6 mg/dL — ABNORMAL HIGH (ref 8.7–10.2)
Chloride: 101 mmol/L (ref 96–106)
Creatinine, Ser: 1.28 mg/dL — ABNORMAL HIGH (ref 0.76–1.27)
Globulin, Total: 2.4 g/dL (ref 1.5–4.5)
Glucose: 87 mg/dL (ref 70–99)
Potassium: 4.9 mmol/L (ref 3.5–5.2)
Sodium: 141 mmol/L (ref 134–144)
Total Protein: 7.3 g/dL (ref 6.0–8.5)
eGFR: 69 mL/min/{1.73_m2} (ref >59)

## 2023-01-13 LAB — CBC WITH DIFFERENTIAL/PLATELET
Basophils Absolute: 0 10*3/uL (ref 0.0–0.2)
Basos: 1 %
EOS (ABSOLUTE): 0.1 10*3/uL (ref 0.0–0.4)
Eos: 3 %
Hematocrit: 45.4 % (ref 37.5–51.0)
Hemoglobin: 14.7 g/dL (ref 13.0–17.7)
Immature Grans (Abs): 0 10*3/uL (ref 0.0–0.1)
Immature Granulocytes: 0 %
Lymphocytes Absolute: 1.5 10*3/uL (ref 0.7–3.1)
Lymphs: 42 %
MCH: 29.5 pg (ref 26.6–33.0)
MCHC: 32.4 g/dL (ref 31.5–35.7)
MCV: 91 fL (ref 79–97)
Monocytes Absolute: 0.3 10*3/uL (ref 0.1–0.9)
Monocytes: 9 %
Neutrophils Absolute: 1.7 10*3/uL (ref 1.4–7.0)
Neutrophils: 45 %
Platelets: 250 10*3/uL (ref 150–450)
RBC: 4.98 x10E6/uL (ref 4.14–5.80)
RDW: 13.1 % (ref 11.6–15.4)
WBC: 3.7 10*3/uL (ref 3.4–10.8)

## 2023-01-13 LAB — MICROALBUMIN / CREATININE URINE RATIO
Creatinine, Urine: 191.3 mg/dL
Microalb/Creat Ratio: 9 mg/g{creat} (ref 0–29)
Microalbumin, Urine: 16.8 ug/mL

## 2023-01-13 LAB — PSA, TOTAL AND FREE
PSA, Free Pct: 30 %
PSA, Free: 0.09 ng/mL
Prostate Specific Ag, Serum: 0.3 ng/mL (ref 0.0–4.0)

## 2023-01-13 LAB — VITAMIN D 25 HYDROXY (VIT D DEFICIENCY, FRACTURES): Vit D, 25-Hydroxy: 18.6 ng/mL — ABNORMAL LOW (ref 30.0–100.0)

## 2023-01-15 ENCOUNTER — Other Ambulatory Visit: Payer: Self-pay | Admitting: Family

## 2023-01-15 MED ORDER — VITAMIN D (ERGOCALCIFEROL) 1.25 MG (50000 UNIT) PO CAPS: 50000.0000 [IU] | ORAL_CAPSULE | ORAL | 3 refills | Status: DC

## 2023-01-16 ENCOUNTER — Encounter (INDEPENDENT_AMBULATORY_CARE_PROVIDER_SITE_OTHER): Payer: Self-pay | Admitting: *Deleted

## 2023-01-16 ENCOUNTER — Ambulatory Visit (INDEPENDENT_AMBULATORY_CARE_PROVIDER_SITE_OTHER): Payer: 59

## 2023-01-16 DIAGNOSIS — E1169 Type 2 diabetes mellitus with other specified complication: Secondary | ICD-10-CM

## 2023-01-16 NOTE — Progress Notes (Signed)
Casey Daniels arrived 01/16/2023 and has given verbal consent to obtain images and complete their overdue diabetic retinal screening.  The images have been sent to an ophthalmologist or optometrist for review and interpretation.  Results will be sent back to Casey Spencer, FNP for review.  Patient has been informed they will be contacted when we receive the results via telephone or MyChart

## 2023-01-17 ENCOUNTER — Encounter: Payer: Self-pay | Admitting: Family Medicine

## 2023-02-07 ENCOUNTER — Other Ambulatory Visit: Payer: Self-pay | Admitting: Family

## 2023-02-07 DIAGNOSIS — E1169 Type 2 diabetes mellitus with other specified complication: Secondary | ICD-10-CM

## 2023-02-13 NOTE — Addendum Note (Signed)
Addended by: Tamera Punt on: 02/13/2023 12:00 PM   Modules accepted: Level of Service

## 2023-02-13 NOTE — Progress Notes (Signed)
Images were unreadable. No charges to be sent.

## 2023-02-21 ENCOUNTER — Other Ambulatory Visit: Payer: Self-pay | Admitting: Family Medicine

## 2023-02-21 DIAGNOSIS — Z1211 Encounter for screening for malignant neoplasm of colon: Secondary | ICD-10-CM

## 2023-03-05 NOTE — Telephone Encounter (Signed)
Copied from CRM #[id number]. Topic: Clinical - Lab/Test Results >> Mar 05, 2023 11:51 AM Casey Daniels wrote: Reason for CRM: Patient is calling regarding his recent eye exam that was done on 02/16/2023. Would like to discuss the results. Best call back number at the moment is [phone].

## 2023-03-08 ENCOUNTER — Telehealth: Payer: Self-pay

## 2023-03-08 NOTE — Telephone Encounter (Signed)
Copied from CRM [id number]. Topic: Clinical - Medication Question >> Mar 08, 2023  1:15 PM Joanette Gula wrote: Reason for CRM PT wants to know if he can take Clarene Reamer while being on Diabetic medication. [phone]  Left detailed message on patients voicemail. Ok to take [employer]. Just to monitor blood sugars closely. Advised to call the office back with any further questions

## 2023-03-19 ENCOUNTER — Encounter: Payer: Self-pay | Admitting: Family

## 2023-05-31 ENCOUNTER — Ambulatory Visit: Payer: Self-pay | Admitting: Family

## 2023-05-31 NOTE — Telephone Encounter (Signed)
 Copied from CRM [id number]. Topic: Clinical - Medical Advice >> May 31, 2023  9:58 AM Priscille Loveless wrote: Reason for CRM: Pt is wanting to know can he take musinex with diabetes.   Patient calling to find out if he can take Mucinex with his diabetes. Patient advised that there should be no problem with him taking Mucinex. Patient had no further questions or concerns at this time.    Reason for Disposition  Caller has medicine question, adult has minor symptoms, caller declines triage, AND triager answers question  Answer Assessment - Initial Assessment Questions 1. NAME of MEDICINE: "What medicine(s) are you calling about?"     Mucinex 2. QUESTION: "What is your question?" (e.g., double dose of medicine, side effect)     Wanted to know if Mucinex is safe to take with diabetes  Protocols used: Medication Question Call-A-AH

## 2023-07-13 ENCOUNTER — Encounter: Payer: Self-pay | Admitting: Family

## 2023-07-13 ENCOUNTER — Ambulatory Visit: Payer: 59 | Admitting: Family

## 2023-07-13 VITALS — BP 131/79 | HR 89 | Temp 98.0°F | Ht 75.0 in | Wt 209.8 lb

## 2023-07-13 DIAGNOSIS — Z1211 Encounter for screening for malignant neoplasm of colon: Secondary | ICD-10-CM

## 2023-07-13 DIAGNOSIS — E559 Vitamin D deficiency, unspecified: Secondary | ICD-10-CM

## 2023-07-13 DIAGNOSIS — E1169 Type 2 diabetes mellitus with other specified complication: Secondary | ICD-10-CM | POA: Diagnosis not present

## 2023-07-13 DIAGNOSIS — Z23 Encounter for immunization: Secondary | ICD-10-CM | POA: Diagnosis not present

## 2023-07-13 DIAGNOSIS — Z87891 Personal history of nicotine dependence: Secondary | ICD-10-CM | POA: Diagnosis not present

## 2023-07-13 DIAGNOSIS — J4541 Moderate persistent asthma with (acute) exacerbation: Secondary | ICD-10-CM

## 2023-07-13 DIAGNOSIS — E785 Hyperlipidemia, unspecified: Secondary | ICD-10-CM | POA: Diagnosis not present

## 2023-07-13 LAB — BAYER DCA HB A1C WAIVED: HB A1C (BAYER DCA - WAIVED): 12.3 % — ABNORMAL HIGH (ref 4.8–5.6)

## 2023-07-13 NOTE — Progress Notes (Signed)
 Subjective:    Patient ID: Casey Daniels, male    DOB: 06-07-1975, 48 y.o.   MRN: [id number]  Chief Complaint  Patient presents with   Annual Exam   Pt presents to the office today for chronic follow up.  He reports he quit alcohol and smoking 05/07/22. Has stopped his Symbicort since stopping smoking.    He is complaining intermittent ED. He has taken Viagra with mild relief.  Asthma There is no cough, shortness of breath or wheezing. This is a chronic problem. The current episode started more than 1 year ago. The problem occurs intermittently. His symptoms are alleviated by rest and OTC cough suppressant. He reports moderate improvement on treatment. His past medical history is significant for asthma.  Hyperlipidemia This is a chronic problem. The current episode started more than 1 year ago. The problem is controlled. Recent lipid tests were reviewed and are normal. Pertinent negatives include no shortness of breath. Current antihyperlipidemic treatment includes statins. The current treatment provides moderate improvement of lipids. Risk factors for coronary artery disease include dyslipidemia, diabetes mellitus, hypertension, a sedentary lifestyle and post-menopausal.  Diabetes He presents for his follow-up diabetic visit. He has type 2 diabetes mellitus. Pertinent negatives for diabetes include no blurred vision and no foot paresthesias. Symptoms are stable. Risk factors for coronary artery disease include dyslipidemia, diabetes mellitus, hypertension and sedentary lifestyle. He is following a generally healthy diet. His overall blood glucose range is 180-200 mg/dl. Eye exam is not current.      Review of Systems  Eyes:  Negative for blurred vision.  Respiratory:  Negative for cough, shortness of breath and wheezing.   All other systems reviewed and are negative.  Family History  Problem Relation Age of Onset   Diabetes Mother    Healthy Brother    Social History    Socioeconomic History   Marital status: Single    Spouse name: Not on file   Number of children: 0   Years of education: 12   Highest education level: High school graduate  Occupational History   Occupation: unemployed    Comment: disabled  Tobacco Use   Smoking status: Former    Current packs/day: 0.00    Average packs/day: 0.3 packs/day for 15.0 years (3.8 ttl pk-yrs)    Types: Cigarettes    Quit date: 05/07/2022    Years since quitting: 1.1   Smokeless tobacco: Never   Tobacco comments:    quits off and on  Vaping Use   Vaping status: Never Used  Substance and Sexual Activity   Alcohol use: Yes    Alcohol/week: 2.0 standard drinks of alcohol    Types: 2 Cans of beer per week   Drug use: Yes    Types: Marijuana   Sexual activity: Not Currently    Birth control/protection: None  Other Topics Concern   Not on file  Social History Narrative   Lives with his brother. No vehicle - rides his bike or walks to local appointments - Gets [facility] if appt out of town   Social Drivers of [employer] Strain: Low Risk  (09/01/2022)   Overall Financial Resource Strain (CARDIA)    Difficulty of Paying Living Expenses: Not hard at all  Food Insecurity: No Food Insecurity (09/01/2022)   Hunger Vital Sign    Worried About Running Out of Food in the Last Year: Never true    Ran Out of Food in the Last Year: Never true  Transportation  Needs: No Transportation Needs (09/01/2022)   PRAPARE - Administrator, Civil Service (Medical): No    Lack of Transportation (Non-Medical): No  Physical Activity: Sufficiently Active (09/01/2022)   Exercise Vital Sign    Days of Exercise per Week: 7 days    Minutes of Exercise per Session: 30 min  Stress: No Stress Concern Present (09/01/2022)   [employer] of Occupational Health - Occupational Stress Questionnaire    Feeling of Stress : Not at all  Social Connections: Unknown (09/01/2022)   Social Connection and Isolation  Panel [NHANES]    Frequency of Communication with Friends and Family: Twice a week    Frequency of Social Gatherings with Friends and Family: Twice a week    Attends Religious Services: Patient unable to answer    Active Member of Clubs or Organizations: Patient unable to answer    Attends Banker Meetings: 1 to 4 times per year    Marital Status: Patient unable to answer        Objective:   Physical Exam Vitals reviewed.  Constitutional:      General: He is not in acute distress.    Appearance: He is well-developed.  HENT:     Head: Normocephalic.     Right Ear: There is impacted cerumen.     Left Ear: Tympanic membrane normal.  Eyes:     General:        Right eye: No discharge.        Left eye: No discharge.     Pupils: Pupils are equal, round, and reactive to light.  Neck:     Thyroid: No thyromegaly.  Cardiovascular:     Rate and Rhythm: Normal rate and regular rhythm.     Heart sounds: Normal heart sounds. No murmur heard. Pulmonary:     Effort: Pulmonary effort is normal. No respiratory distress.     Breath sounds: Normal breath sounds. No wheezing.  Abdominal:     General: Bowel sounds are normal. There is no distension.     Palpations: Abdomen is soft.     Tenderness: There is no abdominal tenderness.  Musculoskeletal:        General: No tenderness. Normal range of motion.     Cervical back: Normal range of motion and neck supple.  Skin:    General: Skin is warm and dry.     Findings: No erythema or rash.  Neurological:     Mental Status: He is alert and oriented to person, place, and time.     Cranial Nerves: No cranial nerve deficit.     Deep Tendon Reflexes: Reflexes are normal and symmetric.  Psychiatric:        Behavior: Behavior normal.        Thought Content: Thought content normal.        Judgment: Judgment normal.       BP 131/79   Pulse 89   Temp 98 F (36.7 C)   Ht 6\' 3"  (1.905 m)   Wt 209 lb 12.8 oz (95.2 kg)   SpO2 97%    BMI 26.22 kg/m      Assessment & Plan:  Karam Dunson comes in today with chief complaint of Annual Exam   Diagnosis and orders addressed:  1. Need for pneumococcal 20-valent conjugate vaccination - Pneumococcal conjugate vaccine 20-valent (Prevnar 20) - CMP14+EGFR  2. Type 2 diabetes mellitus with other specified complication, without long-term current use of insulin (HCC) (Primary) - Bayer  DCA Hb A1c Waived - CMP14+EGFR  3. Hyperlipidemia associated with type 2 diabetes mellitus (HCC) - CMP14+EGFR  4. Former smoker - CMP14+EGFR  5. Vitamin D deficiency - CMP14+EGFR  6. Moderate persistent asthma with acute exacerbation - CMP14+EGFR  7. Screen for colon cancer - Ambulatory referral to Gastroenterology   Labs pending Continue current medications  Health Maintenance reviewed Diet and exercise encouraged  Follow up plan: 3 months   Jannifer Rodney, FNP

## 2023-07-13 NOTE — Patient Instructions (Signed)

## 2023-07-14 LAB — CMP14+EGFR
ALT: 31 IU/L (ref 0–44)
AST: 29 IU/L (ref 0–40)
Albumin: 4.8 g/dL (ref 4.1–5.1)
Alkaline Phosphatase: 74 IU/L (ref 44–121)
BUN/Creatinine Ratio: 17 (ref 9–20)
BUN: 20 mg/dL (ref 6–24)
Bilirubin Total: 0.5 mg/dL (ref 0.0–1.2)
CO2: 21 mmol/L (ref 20–29)
Calcium: 10 mg/dL (ref 8.7–10.2)
Chloride: 101 mmol/L (ref 96–106)
Creatinine, Ser: 1.19 mg/dL (ref 0.76–1.27)
Globulin, Total: 2.3 g/dL (ref 1.5–4.5)
Glucose: 220 mg/dL — ABNORMAL HIGH (ref 70–99)
Potassium: 4.5 mmol/L (ref 3.5–5.2)
Sodium: 139 mmol/L (ref 134–144)
Total Protein: 7.1 g/dL (ref 6.0–8.5)
eGFR: 75 mL/min/{1.73_m2} (ref >59)

## 2023-07-16 ENCOUNTER — Other Ambulatory Visit: Payer: Self-pay | Admitting: Family

## 2023-07-16 ENCOUNTER — Ambulatory Visit: Payer: Self-pay

## 2023-07-16 MED ORDER — METFORMIN HCL ER 750 MG PO TB24: 750.0000 mg | ORAL_TABLET | Freq: Every day | ORAL | 1 refills | Status: DC

## 2023-07-16 NOTE — Telephone Encounter (Signed)
 Copied from CRM [id number]. Topic: Clinical - Lab/Test Results >> Jul 16, 2023  3:01 PM Ethelle Herb L wrote: Reason for CRM: Pt given results. Pt has additional questions.   Chief Complaint: Reviewed labs with pt. Symptoms: None Frequency: today Pertinent Negatives: Patient denies  Disposition: [] ED /[] Urgent Care (no appt availability in office) / [] Appointment(In office/virtual)/ []  [facility]/ [] Home Care/ [] Refused Recommended Disposition /[] [facility]/ []  Follow-up with PCP Additional Notes: Verbalizes understanding.  Reason for Disposition  [1] Other NON-URGENT information for PCP AND [2] does not require PCP response  Answer Assessment - Initial Assessment Questions 1. REASON FOR CALL or QUESTION: "What is your reason for calling today?" or "How can I best help you?" or "What question do you have that I can help answer?"     Called for lab results, no triage 2. CALLER: Document the source of call. (e.g., laboratory, patient).     Patient  Protocols used: PCP Call - No Triage-A-AH

## 2023-07-17 ENCOUNTER — Other Ambulatory Visit: Payer: Self-pay | Admitting: Family

## 2023-07-17 ENCOUNTER — Encounter (INDEPENDENT_AMBULATORY_CARE_PROVIDER_SITE_OTHER): Payer: Self-pay | Admitting: *Deleted

## 2023-07-17 DIAGNOSIS — Z1211 Encounter for screening for malignant neoplasm of colon: Secondary | ICD-10-CM

## 2023-07-25 ENCOUNTER — Encounter: Payer: Self-pay | Admitting: Family Medicine

## 2023-07-26 ENCOUNTER — Ambulatory Visit (INDEPENDENT_AMBULATORY_CARE_PROVIDER_SITE_OTHER)

## 2023-07-26 DIAGNOSIS — E1169 Type 2 diabetes mellitus with other specified complication: Secondary | ICD-10-CM

## 2023-07-26 DIAGNOSIS — Z7984 Long term (current) use of oral hypoglycemic drugs: Secondary | ICD-10-CM | POA: Diagnosis not present

## 2023-07-26 LAB — HM DIABETES EYE EXAM

## 2023-07-26 NOTE — Progress Notes (Signed)
 Casey Daniels arrived 07/26/2023 and has given verbal consent to obtain images and complete their overdue diabetic retinal screening.  The images have been sent to an ophthalmologist or optometrist for review and interpretation.  Results will be sent back to Yevette Hem, FNP for review.  Patient has been informed they will be contacted when we receive the results via telephone or MyChart

## 2023-08-07 ENCOUNTER — Other Ambulatory Visit: Payer: Self-pay | Admitting: Family Medicine

## 2023-08-07 DIAGNOSIS — Z1211 Encounter for screening for malignant neoplasm of colon: Secondary | ICD-10-CM

## 2023-08-09 ENCOUNTER — Telehealth: Payer: Self-pay | Admitting: Pharmacist

## 2023-08-09 NOTE — Telephone Encounter (Signed)
   Patient was identified as falling into the [place] Measure - Diabetes.   Patient was: Left voicemail to schedule with primary care provider.  Unsuccessful outreach  Mliss Tarry Griffin, PharmD, BCACP, CPP Clinical Pharmacist, [facility]

## 2023-08-10 ENCOUNTER — Ambulatory Visit: Payer: Self-pay

## 2023-08-10 NOTE — Telephone Encounter (Signed)
  Chief Complaint: rectal bleeding Symptoms: mild rectal bleeding Frequency: intermittent x 1 year, 1 episode daily for past 2 days Pertinent Negatives: Patient denies blood clots, nausea, vomiting, fever, abdominal pain Disposition: [] ED /[] Urgent Care (no appt availability in office) / [x] Appointment(In office/virtual)/ []  [facility]/ [] Home Care/ [] Refused Recommended Disposition /[] [place]/ []  Follow-up with PCP Additional Notes: Phone call disconnected at the end of triage after patient was scheduled and we had discussed reasons to call back for. Attempted courtesy return call to patient to let him know the phone had disconnected, no answer. No further actions needed.  Copied from CRM [id number]. Topic: Clinical - Red Word Triage >> Aug 10, 2023 12:36 PM Casey Daniels wrote: Red Word that prompted transfer to Nurse Triage: Patient having mental issues and requests to speak with a nurse Reason for Disposition  MILD rectal bleeding (more than just a few drops or streaks)  Answer Assessment - Initial Assessment Questions 1. APPEARANCE of BLOOD: "What color is it?" "Is it passed separately, on the surface of the stool, or mixed in with the stool?"      First patient states green and then states red. Mixed in with stool and on toilet paper.  2. AMOUNT: "How much blood was passed?"      He states sometimes its less or more. It is not bad enough to turn the toilet bowl red.  3. FREQUENCY: "How many times has blood been passed with the stools?"      Once today, once yesterday.  4. ONSET: "When was the blood first seen in the stools?" (Days or weeks)      X 1 year, intermittent.  5. DIARRHEA: "Is there also some diarrhea?" If Yes, ask: "How many diarrhea stools in the past 24 hours?"      Denies.  6. CONSTIPATION: "Do you have constipation?" If Yes, ask: "How bad is it?"     Denies.  7. RECURRENT SYMPTOMS: "Have you had blood in your stools before?" If Yes, ask: "When  was the last time?" and "What happened that time?"      Yes, states he has seen the doctor for this before but "it's been a minute though".  8. BLOOD THINNERS: "Do you take any blood thinners?" (e.g., Coumadin/warfarin, Pradaxa/dabigatran, aspirin)     Denies.  9. OTHER SYMPTOMS: "Do you have any other symptoms?"  (e.g., abdomen pain, vomiting, dizziness, fever)     Increased gas.   10. PREGNANCY: "Is there any chance you are pregnant?" "When was your last menstrual period?"       N/A.  Protocols used: Rectal Bleeding-A-AH

## 2023-08-13 ENCOUNTER — Ambulatory Visit (INDEPENDENT_AMBULATORY_CARE_PROVIDER_SITE_OTHER): Admitting: Nurse Practitioner

## 2023-08-13 ENCOUNTER — Other Ambulatory Visit

## 2023-08-13 ENCOUNTER — Encounter: Payer: Self-pay | Admitting: Nurse Practitioner

## 2023-08-13 VITALS — BP 107/68 | HR 69 | Temp 98.0°F | Ht 75.0 in | Wt 220.2 lb

## 2023-08-13 DIAGNOSIS — K921 Melena: Secondary | ICD-10-CM | POA: Insufficient documentation

## 2023-08-13 NOTE — Progress Notes (Signed)
 Acute Office Visit  Subjective:     Patient ID: Casey Daniels, male    DOB: June 02, 1975, 48 y.o.   MRN: [id number]  Chief Complaint  Patient presents with   Rectal Bleeding    Rectal bleeding for a while when using bathroom off and on     HPI Casey Daniels, a 48 year old male, presents for an acute visit with concerns about intermittent rectal bleeding. The patient reports noticing blood in his stool with the most recent episode occurring "maybe Friday." He is unsure of the exact day but notes it was within the past few days. He denies associated abdominal pain, constipation, diarrhea, or changes in stool caliber. He also denies a history of hemorrhoids, anal fissures, or recent anal intercourse. No previous colonoscopy or known gastrointestinal disease reported.  Active Ambulatory Problems    Diagnosis Date Noted   Asthma 05/26/2016   Vitamin D  deficiency 05/29/2016   Excessive drinking of alcohol 11/28/2016   Former smoker 05/17/2017   Odontogenic abscess 03/10/2019   DM type 2 (diabetes mellitus, type 2) (HCC) 03/13/2019   Hyperlipidemia associated with type 2 diabetes mellitus (HCC) 09/16/2019   Erectile dysfunction 05/12/2022   Hematochezia 08/13/2023   Resolved Ambulatory Problems    Diagnosis Date Noted   Overweight (BMI 25.0-29.9) 11/28/2016   Neck abscess 03/10/2019   Past Medical History:  Diagnosis Date   Arthritis    Diabetes mellitus without complication (HCC)    Hyperlipidemia      Review of Systems  Constitutional:  Negative for chills and fever.  HENT:  Negative for congestion and hearing loss.   Respiratory:  Negative for cough and shortness of breath.   Cardiovascular:  Negative for chest pain and leg swelling.  Gastrointestinal:  Positive for blood in stool. Negative for abdominal pain, constipation, diarrhea, nausea and vomiting.  Musculoskeletal:  Negative for falls.  Skin:  Negative for itching and rash.  Neurological:  Negative for  dizziness and headaches.  Psychiatric/Behavioral:  Negative for suicidal ideas.    Negative unless indicated in HPI    Objective:    BP 107/68   Pulse 69   Temp 98 F (36.7 C) (Temporal)   Ht 6\' 3"  (1.905 m)   Wt 220 lb 3.2 oz (99.9 kg)   SpO2 97%   BMI 27.52 kg/m  BP Readings from Last 3 Encounters:  08/13/23 107/68  07/13/23 131/79  01/12/23 128/73   Wt Readings from Last 3 Encounters:  08/13/23 220 lb 3.2 oz (99.9 kg)  07/13/23 209 lb 12.8 oz (95.2 kg)  01/12/23 208 lb 12.8 oz (94.7 kg)      Physical Exam Vitals and nursing note reviewed. Exam conducted with a chaperone present (Casey Daniels CMA).  Constitutional:      General: He is not in acute distress. HENT:     Head: Normocephalic and atraumatic.     Mouth/Throat:     Mouth: Mucous membranes are moist.  Eyes:     Extraocular Movements: Extraocular movements intact.     Conjunctiva/sclera: Conjunctivae normal.     Pupils: Pupils are equal, round, and reactive to light.  Cardiovascular:     Heart sounds: Normal heart sounds.  Pulmonary:     Effort: Pulmonary effort is normal.     Breath sounds: Normal breath sounds.  Genitourinary:    Rectum: No external hemorrhoid.  Musculoskeletal:        General: Normal range of motion.  Skin:    General: Skin is warm and  dry.  Neurological:     Mental Status: He is alert and oriented to person, place, and time.  Psychiatric:        Mood and Affect: Mood normal.        Behavior: Behavior normal.        Thought Content: Thought content normal.        Judgment: Judgment normal.     No results found for any visits on 08/13/23.      Assessment & Plan:  Hematochezia -     Fecal occult blood, imunochemical   Casey Daniels is a 48 year old African-American male seen today for hematochezia, no acute distress Vita occult blood ordered, instructed to collect the sample and to bring it back to the lab to be processed -Increase intake of fiber, increase hydration   The above  assessment and management plan was discussed with the patient. The patient verbalized understanding of and has agreed to the management plan. Patient is aware to call the clinic if they develop any new symptoms or if symptoms persist or worsen. Patient is aware when to return to the clinic for a follow-up visit. Patient educated on when it is appropriate to go to the emergency department.  Return if symptoms worsen or fail to improve.  Roshanda Balazs [place] Thompson, DNP [facility] [address] [place], [place] [address] [phone]  Note: This document was prepared by [employer] voice dictation technology and any errors that results from this process are unintentional.

## 2023-08-14 LAB — FECAL OCCULT BLOOD, IMMUNOCHEMICAL: Fecal Occult Bld: NEGATIVE

## 2023-08-15 ENCOUNTER — Ambulatory Visit: Payer: Self-pay | Admitting: Nurse Practitioner

## 2023-08-23 ENCOUNTER — Ambulatory Visit (INDEPENDENT_AMBULATORY_CARE_PROVIDER_SITE_OTHER): Admitting: Family

## 2023-08-23 ENCOUNTER — Encounter: Payer: Self-pay | Admitting: Family

## 2023-08-23 ENCOUNTER — Other Ambulatory Visit (HOSPITAL_COMMUNITY)
Admission: RE | Admit: 2023-08-23 | Discharge: 2023-08-23 | Disposition: A | Discharge status: Home / Self Care | Source: Ambulatory Visit | Attending: Family | Admitting: Family

## 2023-08-23 VITALS — BP 128/90 | HR 71 | Temp 97.3°F | Ht 75.0 in | Wt 218.2 lb

## 2023-08-23 DIAGNOSIS — Z113 Encounter for screening for infections with a predominantly sexual mode of transmission: Secondary | ICD-10-CM | POA: Diagnosis not present

## 2023-08-23 LAB — URINE CYTOLOGY ANCILLARY ONLY
Chlamydia: NEGATIVE
Comment: NEGATIVE
Comment: NEGATIVE
Comment: NORMAL
Neisseria Gonorrhea: NEGATIVE
Trichomonas: NEGATIVE

## 2023-08-23 NOTE — Patient Instructions (Signed)
 Preventing Sexually Transmitted Infections, Adult Sexually transmitted infections (STIs) are spread from person to person (are contagious). They are spread, or transmitted, during sex. The sex may be vaginal, anal, or oral. STIs can be passed during sexual contact with skin, genitals, mouth, or rectum. They may spread through body fluids, such as saliva, semen, blood, vaginal mucus, and urine. STIs are very common. They can happen in people of all ages. Some common STIs are: Herpes. Hepatitis B. Chlamydia. Gonorrhea. Syphilis. Trichomoniasis. Human papillomavirus (HPV). Human immunodeficiency virus (HIV). This can cause acquired immunodeficiency syndrome (AIDS). How can STIs affect me? You may not have symptoms with an STI. Even if you do not have symptoms, you can still spread the infection to others. You also still need treatment. STIs can be treated. Some STIs can be cured. Other STIs cannot be cured and will affect you for the rest of your life. Certain STIs may: Require you to take medicine for the rest of your life. Affect your ability to have children. Increase your risk for getting other STIs. Increase your risk of getting certain conditions. These may include: Cervical cancer. Pelvic inflammatory disease (PID). Organ damage or damage to other parts of your body. This can happen if the infection spreads. Cause problems during pregnancy. STIs may be spread to the baby during pregnancy or birth. Females tend to have more severe problems from STIs than males. What can increase my risk? You may be more at risk for an STI if: You do not use protection during sex. You have more than one sex partner. You have a sex partner who has other sex partners. You have sex with a person who has an STI. You have an STI, or you have had an STI before. You inject drugs or have a sex partner who injects drugs. What actions can I take to prevent STIs? The only way to fully prevent STIs is not to  have sex of any kind. This is called practicing abstinence. If you are sexually active, you can protect yourself and others by taking these actions to lower your risk of getting an STI: Lifestyle Have only one sex partner or limit the number of sex partners you have. Avoid having sex after you have alcohol or drugs. Alcohol and drugs can affect your ability to make good choices. This can lead to risky sexual behaviors. Go to prevention counseling. This can teach you how to avoid getting an STI. Barrier protection  Use methods to stop body fluids from being exchanged between partners during sex (barrier protection). These methods can be used during oral, vaginal, or anal sex. They include: External condom, for males. Internal condom, for females. Dental dam. Use a new barrier method for every sex act from start to finish. Know that a barrier method may not protect you from all STIs. Some STIs, such as herpes, are spread through skin-to-skin contact. Avoid all sexual contact if you or a partner has herpes and there is an active flare with open sores. Birth control pills, injections, implants, and intrauterine devices (IUDs) do not protect against STIs. To prevent both STIs and pregnancy, always use a condom with a second form of birth control. General information Ask your health care provider about taking pre-exposure prophylaxis (PrEP) to prevent HIV. Stay up to date on your vaccines. Some vaccines can lower your risk of getting certain STIs. These include: Hepatitis B vaccine. HPV vaccine. This is recommended for people up to age 66. Get tested for STIs. Have your  partners get tested, too. If you test positive for an STI, follow recommendations from your health care provider about treatment. Make sure your sex partners are tested and treated as well. Where to find more information Learn more about STIs from: Centers for Disease Control and Prevention (CDC): More information about certain  STIs: TonerPromos.no Places to get sexual health counseling and treatment for free or at a low cost: gettested.TonerPromos.no U.S. Department of Health and Human Services [facility]): TravelLesson.ca This information is not intended to replace advice given to you by your health care provider. Make sure you discuss any questions you have with your health care provider. Document Revised: 03/30/2022 Document Reviewed: 09/02/2021 Elsevier Patient Education  2024 [employer].

## 2023-08-23 NOTE — Progress Notes (Signed)
 Subjective:    Patient ID: Casey Daniels, male    DOB: 1976-04-01, 48 y.o.   MRN: [id number]  Chief Complaint  Patient presents with   Herpes Zoster    HPI PT presents to the office today to discuss diagnosis of herpes. Reports he was told he had herpes on his last visit. I am unsure if he was confused. I reviewed his last office visit and there is no mention of herpes just hematochezia. He did a FOBT that was negative. Reports his bleeding has resolved.   Denies any penile or genital  lesions, pain or discharge.    Review of Systems  All other systems reviewed and are negative.   Social History   Socioeconomic History   Marital status: Single    Spouse name: Not on file   Number of children: 0   Years of education: 12   Highest education level: High school graduate  Occupational History   Occupation: unemployed    Comment: disabled  Tobacco Use   Smoking status: Former    Current packs/day: 0.00    Average packs/day: 0.3 packs/day for 15.0 years (3.8 ttl pk-yrs)    Types: Cigarettes    Quit date: 05/07/2022    Years since quitting: 1.2   Smokeless tobacco: Never   Tobacco comments:    quits off and on  Vaping Use   Vaping status: Never Used  Substance and Sexual Activity   Alcohol use: Yes    Alcohol/week: 2.0 standard drinks of alcohol    Types: 2 Cans of beer per week   Drug use: Yes    Types: Marijuana   Sexual activity: Not Currently    Birth control/protection: None  Other Topics Concern   Not on file  Social History Narrative   Lives with his brother. No vehicle - rides his bike or walks to local appointments - Gets [facility] if appt out of town   Social Drivers of [employer] Strain: Low Risk  (09/01/2022)   Overall Financial Resource Strain (CARDIA)    Difficulty of Paying Living Expenses: Not hard at all  Food Insecurity: No Food Insecurity (09/01/2022)   Hunger Vital Sign    Worried About Running Out of Food in the Last Year:  Never true    Ran Out of Food in the Last Year: Never true  Transportation Needs: No Transportation Needs (09/01/2022)   PRAPARE - Administrator, Civil Service (Medical): No    Lack of Transportation (Non-Medical): No  Physical Activity: Sufficiently Active (09/01/2022)   Exercise Vital Sign    Days of Exercise per Week: 7 days    Minutes of Exercise per Session: 30 min  Stress: No Stress Concern Present (09/01/2022)   [employer] of Occupational Health - Occupational Stress Questionnaire    Feeling of Stress : Not at all  Social Connections: Unknown (09/01/2022)   Social Connection and Isolation Panel [NHANES]    Frequency of Communication with Friends and Family: Twice a week    Frequency of Social Gatherings with Friends and Family: Twice a week    Attends Religious Services: Patient unable to answer    Active Member of Clubs or Organizations: Patient unable to answer    Attends Banker Meetings: 1 to 4 times per year    Marital Status: Patient unable to answer   Family History  Problem Relation Age of Onset   Diabetes Mother    Healthy Brother  Objective:   Physical Exam Vitals reviewed.  Constitutional:      General: He is not in acute distress.    Appearance: He is well-developed.  HENT:     Head: Normocephalic.  Eyes:     General:        Right eye: No discharge.        Left eye: No discharge.     Pupils: Pupils are equal, round, and reactive to light.  Neck:     Thyroid : No thyromegaly.  Cardiovascular:     Rate and Rhythm: Normal rate and regular rhythm.     Heart sounds: Normal heart sounds. No murmur heard. Pulmonary:     Effort: Pulmonary effort is normal. No respiratory distress.     Breath sounds: Normal breath sounds. No wheezing.  Abdominal:     General: Bowel sounds are normal. There is no distension.     Palpations: Abdomen is soft.     Tenderness: There is no abdominal tenderness.  Musculoskeletal:         General: No tenderness. Normal range of motion.     Cervical back: Normal range of motion and neck supple.  Skin:    General: Skin is warm and dry.     Findings: No erythema or rash.  Neurological:     Mental Status: He is alert and oriented to person, place, and time.     Cranial Nerves: No cranial nerve deficit.     Deep Tendon Reflexes: Reflexes are normal and symmetric.  Psychiatric:        Behavior: Behavior normal.        Thought Content: Thought content normal.        Judgment: Judgment normal.       BP (!) 128/90   Pulse 71   Temp (!) 97.3 F (36.3 C)   Ht 6\' 3"  (1.905 m)   Wt 218 lb 3.2 oz (99 kg)   SpO2 96%   BMI 27.27 kg/m      Assessment & Plan:  Casey Daniels comes in today with chief complaint of Herpes Zoster   Diagnosis and orders addressed:  1. Screening examination for STI (Primary) Safe sex Labs pending  Discussed he has never been diagnosed with herpes in the past - HIV Antibody (routine testing w rflx) - RPR - Urine cytology ancillary only - HSV 1 antibody, IgG - HSV 2 antibody, IgG      Tommas Fragmin, FNP

## 2023-08-24 ENCOUNTER — Ambulatory Visit: Payer: Self-pay | Admitting: Family

## 2023-08-31 ENCOUNTER — Telehealth: Payer: Self-pay | Admitting: Pharmacy Technician

## 2023-08-31 NOTE — Progress Notes (Signed)
   08/31/2023  Patient ID: Casey Daniels, male   DOB: 1975/05/20, 48 y.o.   MRN: [id number]  Patient engaged with clinical pharmacist for management of diabetes on 08/09/2023. Outreach by [employer] technician was requested.   Outreached patient to discuss diabetes medication management. Left voicemail for patient to return my call at their convenience.    Arnold Kester, CPhT [facility] Office: [phone] Email: [email or web address]

## 2023-09-03 ENCOUNTER — Ambulatory Visit: Payer: Self-pay | Admitting: Family

## 2023-09-03 ENCOUNTER — Other Ambulatory Visit: Payer: Self-pay | Admitting: Family

## 2023-09-03 ENCOUNTER — Telehealth: Payer: Self-pay | Admitting: Pharmacy Technician

## 2023-09-03 NOTE — Progress Notes (Signed)
   09/03/2023 Name: Casey Daniels MRN: [id number] DOB: 07-15-1975  Patient is appearing on a report for [facility] and last engaged with the clinical pharmacist to discuss diabetes on 08/09/2023. Contacted patient today to discuss diabetes management and completed medication review.   Diabetes Plan from last clinical pharmacist appointment: Diabetes: - Currently uncontrolled--A1c 12.3% - Reviewed long term cardiovascular and renal outcomes of uncontrolled blood sugar - Reviewed goal A1c, goal fasting, and goal 2 hour post prandial glucose - Reviewed dietary modifications including FOLLOWING A HEART HEALTHY DIET/HEALTHY PLATE METHOD - Reviewed lifestyle modifications including: increased physical activity - Recommend to  - Recommend to check glucose daily (fasting) or if symptomatic   Medication Adherence Barriers Identified:  Patient made recommended medication changes per plan: Yes Patient informs he is taking Metformin  XR every morning and has a supply on hand. Per Dr Anson Basta, patient last received a 90 days supply on 07/16/23. Access issues with any new medication or testing device: No Patient reports medication and testing supplies are affordable for him at this time. Patient is checking blood sugars as prescribed: No Patient informs he last checked his blood sugar a few weeks to 1 month ago. He reports at the time it was 124. He informs he does not like checking his blood sugars with fingersticks. Patient was informed to try and check blood sugar twice a day and to write down his results so he can bring them to his provider appointments. Patient informs he has been eating better. He informs he is trying to avoid sweets, white bread and white potatoes. He instead is having 100% whole wheat bread when choosing a bread option and is eating more lean protein and vegetables such as green beans. He also informs he exercises by riding his bike and walking. He informs he is doing  these things so he "can come off of Metofrmin."  Medication Adherence Barriers Addressed/Actions Taken:  Reviewed medication changes per plan from last clinical pharmacist note Reviewed instructions for monitoring blood sugars at home and reminded patient to keep a written log to review with pharmacist Reminded patient of date/time of upcoming clinical pharmacist follow up and any upcoming PCP/specialists visits. Patient denies transportation barriers to the appointment. Yes patient denies any transportation issues. Patient was reminded of his PCP appointment next month.  Next clinical pharmacist appointment is scheduled for:   Not yet scheduled. Will send in basket message to care guide Lenton Rail, RMA to schedule patient with Chipper Council, PharmD.  Casey Maxfield, CPhT [facility] Office: [phone] Email: [email or web address]

## 2023-09-18 ENCOUNTER — Other Ambulatory Visit: Payer: Self-pay | Admitting: Family

## 2023-09-18 DIAGNOSIS — E1169 Type 2 diabetes mellitus with other specified complication: Secondary | ICD-10-CM

## 2023-09-25 ENCOUNTER — Other Ambulatory Visit

## 2023-09-25 NOTE — Progress Notes (Signed)
 09/25/2023 Name: Casey Daniels MRN: [id number] DOB: 01-Apr-1976  Chief Complaint  Patient presents with   Diabetes    [name] is a 48 y.o. year old male who presented for a telephone visit. I connected with  Casey Daniels on 09/25/23 by telephone and verified that I am speaking with the correct person using two identifiers.  I discussed the limitations of evaluation and management by telemedicine. The patient expressed understanding and agreed to proceed.  Patient was located in her home and PharmD in PCP office during this visit.   They were referred to the pharmacist by their PCP for assistance in managing diabetes.   Subjective:  Care Team: Primary Care Provider: Lavell Bari LABOR, FNP ; Next Scheduled Visit: 10/2023   Medication Access/Adherence  Current Pharmacy:  [facility] [place], [place] - [address] [place] [address] Phone: [phone] Fax: [phone]   Patient reports affordability concerns with their medications: No  Patient reports access/transportation concerns to their pharmacy: No  Patient reports adherence concerns with their medications:  No    Diabetes:  Current medications: metformin  ER 750mg  daily Medications tried in the past: n/a  Current glucose readings: <130 FBG, 111, 114, 119, 118 Using traditional glucometer; testing daily  Patient denies hypoglycemic s/sx including dizziness, shakiness, sweating. Patient denies hyperglycemic symptoms including polyuria, polydipsia, polyphagia, nocturia, neuropathy, blurred vision.  Current meal patterns:  - Breakfast: eggs/bacon, sometimes cornflakes - Lunch & dinner: grilled chicken salads, pork, chicken; increased veggie - Snacks eating 1 banana daily for cramps - Drinks water, body armour Cut white bread, started brown/wheat bread; has decreased pasta, potatoes, pizza; reports anything white has increased sugar  Current physical  activity: encouraged as able  Current medication access support: dual complete  Objective:  Lab Results  Component Value Date   HGBA1C 12.3 (H) 07/13/2023    Lab Results  Component Value Date   CREATININE 1.19 07/13/2023   BUN 20 07/13/2023   NA 139 07/13/2023   K 4.5 07/13/2023   CL 101 07/13/2023   CO2 21 07/13/2023    Lab Results  Component Value Date   CHOL 157 01/12/2023   HDL 74 01/12/2023   LDLCALC 70 01/12/2023   TRIG 69 01/12/2023   CHOLHDL 2.1 01/12/2023    Medications Reviewed Today     Reviewed by Billee Mliss BIRCH, [facility] (Pharmacist) on 09/25/23 at 1524  Med List Status: <None>   Medication Order Taking? Sig Documenting Provider Last Dose Status Informant  Accu-Chek Softclix Lancets lancets [id number] No Check BS twice daily and as needed Dx E11.9 Lavell Bari LABOR, FNP Taking Active   atorvastatin  (LIPITOR) 20 MG tablet [id number]  TAKE ONE TABLET DAILY Lavell Bari LABOR, FNP  Active   blood glucose meter kit and supplies [id number] No Check BS BID and PRN (dx E11.9) Lavell Bari LABOR, FNP Taking Active   glucose blood (GLUCOSE METER TEST) test strip [id number] No Use BID Lavell Bari A, FNP Taking Active   metFORMIN  (GLUCOPHAGE -XR) 750 MG 24 hr tablet [id number] No Take 1 tablet (750 mg total) by mouth daily with breakfast. Lavell Bari LABOR, FNP Taking Active   sildenafil  (VIAGRA ) 100 MG tablet [id number] No Take 0.5-1 tablets (50-100 mg total) by mouth daily as needed for erectile dysfunction. Lavell Bari LABOR, FNP Taking Active   Vitamin D , Ergocalciferol , (DRISDOL ) 1.25 MG (50000 UNIT) CAPS capsule [id number] No Take 1 capsule (50,000 Units total) by mouth  every 7 (seven) days. Lavell Bari LABOR, FNP Taking Active            Assessment/Plan:   Diabetes: - Currently controlled per patient report; FBG<120-130 - Reviewed long term cardiovascular and renal outcomes of uncontrolled blood sugar - Reviewed goal A1c, goal fasting, and goal 2 hour post prandial  glucose - Reviewed dietary modifications including FOLLOWING A HEART HEALTHY DIET/HEALTHY PLATE METHOD  - Reviewed lifestyle modifications including: increased physical activity - Recommend to continue current therapy; based on reported blood sugars patient will likely be at goal;  if not, we can look to increase metformin  vs adding another agent - Recommend to check glucose daily (fasting) or if symptomatic   Follow Up Plan: PCP 10/12/23  Mliss Tarry Griffin, PharmD, BCACP, CPP Clinical Pharmacist, [facility]

## 2023-10-12 ENCOUNTER — Ambulatory Visit: Admitting: Family

## 2023-10-12 ENCOUNTER — Encounter: Payer: Self-pay | Admitting: Family

## 2023-10-12 VITALS — BP 122/86 | HR 85 | Temp 98.5°F | Ht 75.0 in | Wt 218.0 lb

## 2023-10-12 DIAGNOSIS — J4541 Moderate persistent asthma with (acute) exacerbation: Secondary | ICD-10-CM

## 2023-10-12 DIAGNOSIS — N529 Male erectile dysfunction, unspecified: Secondary | ICD-10-CM

## 2023-10-12 DIAGNOSIS — E1169 Type 2 diabetes mellitus with other specified complication: Secondary | ICD-10-CM | POA: Diagnosis not present

## 2023-10-12 DIAGNOSIS — Z1211 Encounter for screening for malignant neoplasm of colon: Secondary | ICD-10-CM

## 2023-10-12 DIAGNOSIS — E559 Vitamin D deficiency, unspecified: Secondary | ICD-10-CM | POA: Diagnosis not present

## 2023-10-12 DIAGNOSIS — Z7984 Long term (current) use of oral hypoglycemic drugs: Secondary | ICD-10-CM | POA: Diagnosis not present

## 2023-10-12 DIAGNOSIS — E785 Hyperlipidemia, unspecified: Secondary | ICD-10-CM

## 2023-10-12 LAB — BAYER DCA HB A1C WAIVED: HB A1C (BAYER DCA - WAIVED): 7 % — ABNORMAL HIGH (ref 4.8–5.6)

## 2023-10-12 MED ORDER — ATORVASTATIN CALCIUM 20 MG PO TABS: 20.0000 mg | ORAL_TABLET | Freq: Every day | ORAL | 0 refills | Status: DC

## 2023-10-12 MED ORDER — METFORMIN HCL ER 750 MG PO TB24
750.0000 mg | ORAL_TABLET | Freq: Every day | ORAL | 1 refills | Status: DC
Start: 2023-10-12 — End: 2024-02-12

## 2023-10-12 MED ORDER — SILDENAFIL CITRATE 100 MG PO TABS
50.0000 mg | ORAL_TABLET | Freq: Every day | ORAL | 2 refills | Status: AC | PRN
Start: 2023-10-12 — End: ?

## 2023-10-12 NOTE — Patient Instructions (Signed)
 Health Maintenance, Male  Adopting a healthy lifestyle and getting preventive care are important in promoting health and wellness. Ask your health care provider about:  The right schedule for you to have regular tests and exams.  Things you can do on your own to prevent diseases and keep yourself healthy.  What should I know about diet, weight, and exercise?  Eat a healthy diet    Eat a diet that includes plenty of vegetables, fruits, low-fat dairy products, and lean protein.  Do not eat a lot of foods that are high in solid fats, added sugars, or sodium.  Maintain a healthy weight  Body mass index (BMI) is a measurement that can be used to identify possible weight problems. It estimates body fat based on height and weight. Your health care provider can help determine your BMI and help you achieve or maintain a healthy weight.  Get regular exercise  Get regular exercise. This is one of the most important things you can do for your health. Most adults should:  Exercise for at least 150 minutes each week. The exercise should increase your heart rate and make you sweat (moderate-intensity exercise).  Do strengthening exercises at least twice a week. This is in addition to the moderate-intensity exercise.  Spend less time sitting. Even light physical activity can be beneficial.  Watch cholesterol and blood lipids  Have your blood tested for lipids and cholesterol at 49 years of age, then have this test every 5 years.  You may need to have your cholesterol levels checked more often if:  Your lipid or cholesterol levels are high.  You are older than 49 years of age.  You are at high risk for heart disease.  What should I know about cancer screening?  Many types of cancers can be detected early and may often be prevented. Depending on your health history and family history, you may need to have cancer screening at various ages. This may include screening for:  Colorectal cancer.  Prostate cancer.  Skin cancer.  Lung  cancer.  What should I know about heart disease, diabetes, and high blood pressure?  Blood pressure and heart disease  High blood pressure causes heart disease and increases the risk of stroke. This is more likely to develop in people who have high blood pressure readings or are overweight.  Talk with your health care provider about your target blood pressure readings.  Have your blood pressure checked:  Every 3-5 years if you are 9-95 years of age.  Every year if you are 85 years old or older.  If you are between the ages of 29 and 29 and are a current or former smoker, ask your health care provider if you should have a one-time screening for abdominal aortic aneurysm (AAA).  Diabetes  Have regular diabetes screenings. This checks your fasting blood sugar level. Have the screening done:  Once every three years after age 23 if you are at a normal weight and have a low risk for diabetes.  More often and at a younger age if you are overweight or have a high risk for diabetes.  What should I know about preventing infection?  Hepatitis B  If you have a higher risk for hepatitis B, you should be screened for this virus. Talk with your health care provider to find out if you are at risk for hepatitis B infection.  Hepatitis C  Blood testing is recommended for:  Everyone born from [birth year] through 1966.  Anyone  with known risk factors for hepatitis C.  Sexually transmitted infections (STIs)  You should be screened each year for STIs, including gonorrhea and chlamydia, if:  You are sexually active and are younger than 49 years of age.  You are older than 49 years of age and your health care provider tells you that you are at risk for this type of infection.  Your sexual activity has changed since you were last screened, and you are at increased risk for chlamydia or gonorrhea. Ask your health care provider if you are at risk.  Ask your health care provider about whether you are at high risk for HIV. Your health care provider  may recommend a prescription medicine to help prevent HIV infection. If you choose to take medicine to prevent HIV, you should first get tested for HIV. You should then be tested every 3 months for as long as you are taking the medicine.  Follow these instructions at home:  Alcohol use  Do not drink alcohol if your health care provider tells you not to drink.  If you drink alcohol:  Limit how much you have to 0-2 drinks a day.  Know how much alcohol is in your drink. In the [place] one drink equals one 12 oz bottle of beer (355 mL), one 5 oz glass of wine (148 mL), or one 1 oz glass of hard liquor (44 mL).  Lifestyle  Do not use any products that contain nicotine or tobacco. These products include cigarettes, chewing tobacco, and vaping devices, such as e-cigarettes. If you need help quitting, ask your health care provider.  Do not use street drugs.  Do not share needles.  Ask your health care provider for help if you need support or information about quitting drugs.  General instructions  Schedule regular health, dental, and eye exams.  Stay current with your vaccines.  Tell your health care provider if:  You often feel depressed.  You have ever been abused or do not feel safe at home.  Summary  Adopting a healthy lifestyle and getting preventive care are important in promoting health and wellness.  Follow your health care provider's instructions about healthy diet, exercising, and getting tested or screened for diseases.  Follow your health care provider's instructions on monitoring your cholesterol and blood pressure.  This information is not intended to replace advice given to you by your health care provider. Make sure you discuss any questions you have with your health care provider.  Document Revised: 08/09/2020 Document Reviewed: 08/09/2020  Elsevier Patient Education  2024 [employer].

## 2023-10-12 NOTE — Progress Notes (Signed)
 Subjective:    Patient ID: Casey Daniels, male    DOB: [birth year], 48 y.o.   MRN: [id number]  Chief Complaint  Patient presents with   Medical Management of Chronic Issues   Pt presents to the office today for chronic follow up.    He reports he quit alcohol and smoking 05/07/22. Has stopped his Symbicort  since stopping smoking.    He is complaining intermittent ED. He has taken Viagra  with mild relief.  Asthma He complains of cough. There is no shortness of breath or wheezing. This is a chronic problem. The current episode started more than 1 year ago. The problem occurs intermittently. The cough is dry. His symptoms are alleviated by rest and OTC cough suppressant. He reports moderate improvement on treatment. His past medical history is significant for asthma.  Hyperlipidemia This is a chronic problem. The current episode started more than 1 year ago. The problem is controlled. Recent lipid tests were reviewed and are normal. Pertinent negatives include no shortness of breath. Current antihyperlipidemic treatment includes statins. The current treatment provides moderate improvement of lipids. Risk factors for coronary artery disease include dyslipidemia, diabetes mellitus, hypertension, a sedentary lifestyle and post-menopausal.  Diabetes He presents for his follow-up diabetic visit. He has type 2 diabetes mellitus. Pertinent negatives for diabetes include no blurred vision and no foot paresthesias. Symptoms are stable. Risk factors for coronary artery disease include dyslipidemia, diabetes mellitus, hypertension and sedentary lifestyle. He is following a generally healthy diet. His overall blood glucose range is 110-130 mg/dl. Eye exam is not current.      Review of Systems  Eyes:  Negative for blurred vision.  Respiratory:  Positive for cough. Negative for shortness of breath and wheezing.   All other systems reviewed and are negative.  Family History  Problem Relation Age  of Onset   Diabetes Mother    Healthy Brother    Social History   Socioeconomic History   Marital status: Single    Spouse name: Not on file   Number of children: 0   Years of education: 12   Highest education level: High school graduate  Occupational History   Occupation: unemployed    Comment: disabled  Tobacco Use   Smoking status: Former    Current packs/day: 0.00    Average packs/day: 0.3 packs/day for 15.0 years (3.8 ttl pk-yrs)    Types: Cigarettes    Quit date: 05/07/2022    Years since quitting: 1.4   Smokeless tobacco: Never   Tobacco comments:    quits off and on  Vaping Use   Vaping status: Never Used  Substance and Sexual Activity   Alcohol use: Yes    Alcohol/week: 2.0 standard drinks of alcohol    Types: 2 Cans of beer per week   Drug use: Yes    Types: Marijuana   Sexual activity: Not Currently    Birth control/protection: None  Other Topics Concern   Not on file  Social History Narrative   Lives with his brother. No vehicle - rides his bike or walks to local appointments - Gets [facility] if appt out of town   Social Drivers of [employer] Strain: Low Risk  (09/01/2022)   Overall Financial Resource Strain (CARDIA)    Difficulty of Paying Living Expenses: Not hard at all  Food Insecurity: No Food Insecurity (09/01/2022)   Hunger Vital Sign    Worried About Running Out of Food in the Last Year: Never true  Ran Out of Food in the Last Year: Never true  Transportation Needs: No Transportation Needs (09/01/2022)   PRAPARE - Administrator, Civil Service (Medical): No    Lack of Transportation (Non-Medical): No  Physical Activity: Sufficiently Active (09/01/2022)   Exercise Vital Sign    Days of Exercise per Week: 7 days    Minutes of Exercise per Session: 30 min  Stress: No Stress Concern Present (09/01/2022)   [employer] of Occupational Health - Occupational Stress Questionnaire    Feeling of Stress : Not at all  Social  Connections: Unknown (09/01/2022)   Social Connection and Isolation Panel    Frequency of Communication with Friends and Family: Twice a week    Frequency of Social Gatherings with Friends and Family: Twice a week    Attends Religious Services: Patient unable to answer    Active Member of Clubs or Organizations: Patient unable to answer    Attends Banker Meetings: 1 to 4 times per year    Marital Status: Patient unable to answer        Objective:   Physical Exam Vitals reviewed.  Constitutional:      General: He is not in acute distress.    Appearance: He is well-developed.  HENT:     Head: Normocephalic.     Right Ear: There is impacted cerumen.     Left Ear: There is impacted cerumen.  Eyes:     General:        Right eye: No discharge.        Left eye: No discharge.     Pupils: Pupils are equal, round, and reactive to light.  Neck:     Thyroid : No thyromegaly.  Cardiovascular:     Rate and Rhythm: Normal rate and regular rhythm.     Heart sounds: Normal heart sounds. No murmur heard. Pulmonary:     Effort: Pulmonary effort is normal. No respiratory distress.     Breath sounds: Normal breath sounds. No wheezing.  Abdominal:     General: Bowel sounds are normal. There is no distension.     Palpations: Abdomen is soft.     Tenderness: There is no abdominal tenderness.  Musculoskeletal:        General: No tenderness. Normal range of motion.     Cervical back: Normal range of motion and neck supple.  Skin:    General: Skin is warm and dry.     Findings: No erythema or rash.  Neurological:     Mental Status: He is alert and oriented to person, place, and time.     Cranial Nerves: No cranial nerve deficit.     Deep Tendon Reflexes: Reflexes are normal and symmetric.  Psychiatric:        Behavior: Behavior normal.        Thought Content: Thought content normal.        Judgment: Judgment normal.       BP 122/86   Pulse 85   Temp 98.5 F (36.9 C)    Ht 6' 3 (1.905 m)   Wt 218 lb (98.9 kg)   SpO2 96%   BMI 27.25 kg/m      Assessment & Plan:  Casey Daniels comes in today with chief complaint of Medical Management of Chronic Issues   Diagnosis and orders addressed:  1. Hyperlipidemia associated with type 2 diabetes mellitus (HCC) - CMP14+EGFR - atorvastatin  (LIPITOR) 20 MG tablet; Take 1 tablet (20 mg total)  by mouth daily.  Dispense: 90 tablet; Refill: 0  2. Erectile dysfunction, unspecified erectile dysfunction type - CMP14+EGFR - sildenafil  (VIAGRA ) 100 MG tablet; Take 0.5-1 tablets (50-100 mg total) by mouth daily as needed for erectile dysfunction.  Dispense: 20 tablet; Refill: 2  3. Moderate persistent asthma with acute exacerbation - CMP14+EGFR  4. Vitamin D  deficiency - CMP14+EGFR  5. Type 2 diabetes mellitus with other specified complication, without long-term current use of insulin  (HCC) (Primary) - Bayer DCA Hb A1c Waived - CMP14+EGFR - metFORMIN  (GLUCOPHAGE -XR) 750 MG 24 hr tablet; Take 1 tablet (750 mg total) by mouth daily with breakfast.  Dispense: 90 tablet; Refill: 1  6. Colon cancer screening - CMP14+EGFR - Ambulatory referral to Gastroenterology    Labs pending Continue current medications  Health Maintenance reviewed Diet and exercise encouraged  Follow up plan: 3 months   Bari Learn, FNP

## 2023-10-13 LAB — CMP14+EGFR
ALT: 32 IU/L (ref 0–44)
AST: 30 IU/L (ref 0–40)
Albumin: 4.4 g/dL (ref 4.1–5.1)
Alkaline Phosphatase: 55 IU/L (ref 44–121)
BUN/Creatinine Ratio: 13 (ref 9–20)
BUN: 16 mg/dL (ref 6–24)
Bilirubin Total: 0.4 mg/dL (ref 0.0–1.2)
CO2: 23 mmol/L (ref 20–29)
Calcium: 9.9 mg/dL (ref 8.7–10.2)
Chloride: 102 mmol/L (ref 96–106)
Creatinine, Ser: 1.19 mg/dL (ref 0.76–1.27)
Globulin, Total: 2.7 g/dL (ref 1.5–4.5)
Glucose: 173 mg/dL — ABNORMAL HIGH (ref 70–99)
Potassium: 4.3 mmol/L (ref 3.5–5.2)
Sodium: 138 mmol/L (ref 134–144)
Total Protein: 7.1 g/dL (ref 6.0–8.5)
eGFR: 75 mL/min/1.73 (ref >59)

## 2023-10-15 ENCOUNTER — Ambulatory Visit: Payer: Self-pay | Admitting: Family

## 2023-11-26 ENCOUNTER — Ambulatory Visit

## 2023-11-27 ENCOUNTER — Ambulatory Visit

## 2024-01-07 ENCOUNTER — Encounter: Payer: Self-pay | Admitting: Family

## 2024-01-28 ENCOUNTER — Ambulatory Visit (INDEPENDENT_AMBULATORY_CARE_PROVIDER_SITE_OTHER)

## 2024-01-28 ENCOUNTER — Ambulatory Visit

## 2024-01-28 VITALS — BP 122/86 | HR 85 | Ht 75.0 in | Wt 218.0 lb

## 2024-01-28 DIAGNOSIS — Z0000 Encounter for general adult medical examination without abnormal findings: Secondary | ICD-10-CM | POA: Diagnosis not present

## 2024-01-28 NOTE — Patient Instructions (Signed)
 [name],  Thank you for taking the time for your Medicare Wellness Visit. I appreciate your continued commitment to your health goals. Please review the care plan we discussed, and feel free to reach out if I can assist you further.  Medicare recommends these wellness visits once per year to help you and your care team stay ahead of potential health issues. These visits are designed to focus on prevention, allowing your provider to concentrate on managing your acute and chronic conditions during your regular appointments.  Please note that Annual Wellness Visits do not include a physical exam. Some assessments may be limited, especially if the visit was conducted virtually. If needed, we may recommend a separate in-person follow-up with your provider.  Ongoing Care Seeing your primary care provider every 3 to 6 months helps us  monitor your health and provide consistent, personalized care.   Referrals If a referral was made during today's visit and you haven't received any updates within two weeks, please contact the referred provider directly to check on the status.  Recommended Screenings:  Health Maintenance  Topic Date Due   Medicare Annual Wellness Visit  09/01/2023   Flu Shot  11/02/2023   COVID-19 Vaccine (3 - 2025-26 season) 12/03/2023   Yearly kidney health urinalysis for diabetes  01/12/2024   Complete foot exam   01/12/2024   Colon Cancer Screening  07/12/2024*   Hepatitis B Vaccine (1 of 3 - 19+ 3-dose series) 10/09/2024*   Hemoglobin A1C  04/13/2024   Eye exam for diabetics  07/25/2024   Yearly kidney function blood test for diabetes  10/11/2024   DTaP/Tdap/Td vaccine (4 - Td or Tdap) 02/18/2025   Pneumococcal Vaccine  Completed   Hepatitis C Screening  Completed   HIV Screening  Completed   HPV Vaccine  Aged Out   Meningitis B Vaccine  Aged Out  *Topic was postponed. The date shown is not the original due date.       01/28/2024   12:51 PM  Advanced Directives   Does Patient Have a Medical Advance Directive? No   Advance Care Planning is important because it: Ensures you receive medical care that aligns with your values, goals, and preferences. Provides guidance to your family and loved ones, reducing the emotional burden of decision-making during critical moments.  Vision: Annual vision screenings are recommended for early detection of glaucoma, cataracts, and diabetic retinopathy. These exams can also reveal signs of chronic conditions such as diabetes and high blood pressure.  Dental: Annual dental screenings help detect early signs of oral cancer, gum disease, and other conditions linked to overall health, including heart disease and diabetes.  Please see the attached documents for additional preventive care recommendations.

## 2024-01-28 NOTE — Progress Notes (Signed)
 Subjective:   Casey Daniels is a 48 y.o. who presents for a Medicare Wellness preventive visit.  As a reminder, Annual Wellness Visits don't include a physical exam, and some assessments may be limited, especially if this visit is performed virtually. We may recommend an in-person follow-up visit with your provider if needed.  Visit Complete: Virtual I connected with  [name] on 01/28/24 by a audio enabled telemedicine application and verified that I am speaking with the correct person using two identifiers.  Patient Location: Home  Provider Location: Office/Clinic  I discussed the limitations of evaluation and management by telemedicine. The patient expressed understanding and agreed to proceed.  Vital Signs: Because this visit was a virtual/telehealth visit, some criteria may be missing or patient reported. Any vitals not documented were not able to be obtained and vitals that have been documented are patient reported.  VideoDeclined- This patient declined Librarian, academic. Therefore the visit was completed with audio only.  Persons Participating in Visit: Patient.  AWV Questionnaire: No: Patient Medicare AWV questionnaire was not completed prior to this visit.  Cardiac Risk Factors include: advanced age (>80men, >74 women);diabetes mellitus;dyslipidemia;hypertension;male gender;smoking/ tobacco exposure     Objective:    There were no vitals filed for this visit. There is no height or weight on file to calculate BMI.     01/28/2024   12:51 PM 09/01/2022   11:33 AM 09/10/2020    3:47 PM 06/18/2019    9:38 AM 03/14/2019    5:48 PM 03/10/2019    2:27 PM 03/13/2018    8:15 AM  Advanced Directives  Does Patient Have a Medical Advance Directive? No Yes No No  No No   Type of Special Educational Needs Teacher of [place];Living will       Copy of Healthcare Power of Attorney in Chart?  No - copy requested       Would patient like  information on creating a medical advance directive?   No - Patient declined No - Patient declined No - Patient declined  No - Patient declined      Data saved with a previous flowsheet row definition    Current Medications (verified) Outpatient Encounter Medications as of 01/28/2024  Medication Sig   Accu-Chek Softclix Lancets lancets Check BS twice daily and as needed Dx E11.9   atorvastatin  (LIPITOR) 20 MG tablet Take 1 tablet (20 mg total) by mouth daily.   blood glucose meter kit and supplies Check BS BID and PRN (dx E11.9)   glucose blood (GLUCOSE METER TEST) test strip Use BID   metFORMIN  (GLUCOPHAGE -XR) 750 MG 24 hr tablet Take 1 tablet (750 mg total) by mouth daily with breakfast.   sildenafil  (VIAGRA ) 100 MG tablet Take 0.5-1 tablets (50-100 mg total) by mouth daily as needed for erectile dysfunction.   Vitamin D , Ergocalciferol , (DRISDOL ) 1.25 MG (50000 UNIT) CAPS capsule Take 1 capsule (50,000 Units total) by mouth every 7 (seven) days.   No facility-administered encounter medications on file as of 01/28/2024.    Allergies (verified) Bee venom   History: Past Medical History:  Diagnosis Date   Arthritis    bilateral ankles due to club foot release as an infant   Asthma    Diabetes mellitus without complication (HCC)    Hyperlipidemia    Past Surgical History:  Procedure Laterality Date   CLUB FOOT RELEASE Bilateral    INCISION AND DRAINAGE ABSCESS N/A 03/12/2019   Procedure: INCISION AND DRAINAGE  NECK ABSCESS;  Surgeon: Karis Clunes, MD;  Location: [facility] OR;  Service: ENT;  Laterality: N/A;   Family History  Problem Relation Age of Onset   Diabetes Mother    Healthy Brother    Social History   Socioeconomic History   Marital status: Single    Spouse name: Not on file   Number of children: 0   Years of education: 12   Highest education level: High school graduate  Occupational History   Occupation: unemployed    Comment: disabled  Tobacco Use   Smoking status:  Former    Current packs/day: 0.00    Average packs/day: 0.3 packs/day for 15.0 years (3.8 ttl pk-yrs)    Types: Cigarettes    Quit date: 05/07/2022    Years since quitting: 1.7   Smokeless tobacco: Never   Tobacco comments:    quits off and on  Vaping Use   Vaping status: Never Used  Substance and Sexual Activity   Alcohol use: Yes    Alcohol/week: 2.0 standard drinks of alcohol    Types: 2 Cans of beer per week   Drug use: Yes    Types: Marijuana   Sexual activity: Not Currently    Birth control/protection: None  Other Topics Concern   Not on file  Social History Narrative   Lives with his brother. No vehicle - rides his bike or walks to local appointments - Gets [facility] if appt out of town   Social Drivers of [employer] Strain: Low Risk  (01/28/2024)   Overall Financial Resource Strain (CARDIA)    Difficulty of Paying Living Expenses: Not hard at all  Food Insecurity: No Food Insecurity (01/28/2024)   Hunger Vital Sign    Worried About Running Out of Food in the Last Year: Never true    Ran Out of Food in the Last Year: Never true  Transportation Needs: No Transportation Needs (01/28/2024)   PRAPARE - Administrator, Civil Service (Medical): No    Lack of Transportation (Non-Medical): No  Physical Activity: Sufficiently Active (01/28/2024)   Exercise Vital Sign    Days of Exercise per Week: 7 days    Minutes of Exercise per Session: 30 min  Stress: No Stress Concern Present (01/28/2024)   [employer] of Occupational Health - Occupational Stress Questionnaire    Feeling of Stress: Only a little  Social Connections: Unknown (01/28/2024)   Social Connection and Isolation Panel    Frequency of Communication with Friends and Family: Twice a week    Frequency of Social Gatherings with Friends and Family: Twice a week    Attends Religious Services: 1 to 4 times per year    Active Member of [employer] or Organizations: No    Attends Museum/gallery Exhibitions Officer: Never    Marital Status: Patient unable to answer    Tobacco Counseling Counseling given: Not Answered Tobacco comments: quits off and on    Clinical Intake:  Pre-visit preparation completed: Yes  Pain : No/denies pain     Nutritional Risks: None Diabetes: Yes  Lab Results  Component Value Date   HGBA1C 7.0 (H) 10/12/2023   HGBA1C 12.3 (H) 07/13/2023   HGBA1C 5.7 (H) 01/12/2023     How often do you need to have someone help you when you read instructions, pamphlets, or other written materials from your doctor or pharmacy?: 1 - Never  Interpreter Needed?: No  Information entered by :: alia t/cma   Activities of  Daily Living     01/28/2024   12:47 PM  In your present state of health, do you have any difficulty performing the following activities:  Hearing? 0  Vision? 0  Difficulty concentrating or making decisions? 0  Walking or climbing stairs? 0  Dressing or bathing? 0  Doing errands, shopping? 1  Comment pt don't drive/walker over to dr office  Preparing Food and eating ? N  Using the Toilet? N  In the past six months, have you accidently leaked urine? N  Do you have problems with loss of bowel control? N  Managing your Medications? N  Managing your Finances? N  Housekeeping or managing your Housekeeping? N    Patient Care Team: Lavell Bari LABOR, FNP as PCP - General (Family Medicine) Cindie Carlin POUR, DO as Consulting Physician (Internal Medicine) Ladora Ross Lacy Phebe, MD as Referring Physician (Optometry)  I have updated your Care Teams any recent Medical Services you may have received from other providers in the past year.     Assessment:   This is a routine wellness examination for Casey Daniels.  Hearing/Vision screen Hearing Screening - Comments:: Pt denies hearing dif Vision Screening - Comments:: Pt wear glasses   Goals Addressed             This Visit's Progress    DIET - INCREASE WATER INTAKE   On track       Depression Screen     01/28/2024   12:51 PM 10/12/2023    2:02 PM 08/23/2023   11:10 AM 01/12/2023    1:55 PM 09/11/2022   10:00 AM 09/01/2022   11:28 AM 05/12/2022    1:59 PM  PHQ 2/9 Scores  PHQ - 2 Score 0 0 0 0 0 0 0  PHQ- 9 Score  0 0 0   0    Fall Risk     01/28/2024   12:45 PM 10/12/2023    2:01 PM 01/12/2023    1:56 PM 09/11/2022   10:00 AM 09/01/2022   11:34 AM  Fall Risk   Falls in the past year? 0 0 0 0 0  Number falls in past yr: 0 0 0 0 0  Injury with Fall? 0 0 0 0 0  Risk for fall due to : No Fall Risks No Fall Risks No Fall Risks Impaired balance/gait No Fall Risks  Follow up Falls evaluation completed;Education provided Falls evaluation completed Falls evaluation completed  Falls prevention discussed;Falls evaluation completed    MEDICARE RISK AT HOME:  Medicare Risk at Home Any stairs in or around the home?: No If so, are there any without handrails?: No Home free of loose throw rugs in walkways, pet beds, electrical cords, etc?: Yes Adequate lighting in your home to reduce risk of falls?: Yes Life alert?: No Use of a cane, walker or w/c?: No Grab bars in the bathroom?: No Shower chair or bench in shower?: No Elevated toilet seat or a handicapped toilet?: No  TIMED UP AND GO:  Was the test performed?  No  Cognitive Function: 6CIT completed    03/13/2018    8:17 AM 02/26/2017   11:25 AM  MMSE - Mini Mental State Exam  Not completed:  Unable to complete  Orientation to time 5   Orientation to Place 5   Registration 3   Attention/ Calculation 5   Recall 3   Language- name 2 objects 2   Language- repeat 1   Language- follow 3 step command 3  Language- read & follow direction 1   Write a sentence 1   Copy design 1   Total score 30         01/28/2024   12:54 PM 09/01/2022   11:29 AM 09/10/2020    3:47 PM 06/18/2019    9:44 AM  6CIT Screen  What Year? 0 points 0 points 0 points 0 points  What month? 0 points 0 points 0 points 0 points   What time? 0 points 0 points 0 points 0 points  Count back from 20 0 points 0 points 0 points 0 points  Months in reverse 0 points 0 points 0 points 0 points  Repeat phrase 0 points 2 points 0 points 0 points  Total Score 0 points 2 points 0 points 0 points    Immunizations Immunization History  Administered Date(s) Administered   Influenza, Seasonal, Injecte, Preservative Fre 01/12/2023   Influenza,inj,Quad PF,6+ Mos 01/29/2017, 02/19/2018, 01/07/2019, 01/14/2021, 05/12/2022   Influenza,trivalent, recombinat, inj, PF 04/17/2015   Moderna Sars-Covid-2 Vaccination 07/10/2019, 08/07/2019   PNEUMOCOCCAL CONJUGATE-20 07/13/2023   Pneumococcal Polysaccharide-23 09/16/2019   Td (Adult),5 Lf Tetanus Toxid, Preservative Free 10/22/2006   Tdap 10/22/2006, 02/19/2015    Screening Tests Health Maintenance  Topic Date Due   Influenza Vaccine  11/02/2023   COVID-19 Vaccine (3 - 2025-26 season) 12/03/2023   Diabetic kidney evaluation - Urine ACR  01/12/2024   FOOT EXAM  01/12/2024   Colonoscopy  07/12/2024 (Originally 04/16/2019)   Hepatitis B Vaccines 19-59 Average Risk (1 of 3 - 19+ 3-dose series) 10/09/2024 (Originally 04/15/1993)   HEMOGLOBIN A1C  04/13/2024   OPHTHALMOLOGY EXAM  07/25/2024   Diabetic kidney evaluation - eGFR measurement  10/11/2024   Medicare Annual Wellness (AWV)  01/27/2025   DTaP/Tdap/Td (4 - Td or Tdap) 02/18/2025   Pneumococcal Vaccine  Completed   Hepatitis C Screening  Completed   HIV Screening  Completed   HPV VACCINES  Aged Out   Meningococcal B Vaccine  Aged Out    Health Maintenance Items Addressed: See Nurse Notes at the end of this note  Additional Screening:  Vision Screening: Recommended annual ophthalmology exams for early detection of glaucoma and other disorders of the eye. Is the patient up to date with their annual eye exam?  No  Who is the provider or what is the name of the office in which the patient attends annual eye exams?  N/a  Dental Screening: Recommended annual dental exams for proper oral hygiene  Community Resource Referral / Chronic Care Management: CRR required this visit?  No   CCM required this visit?  No   Plan:    I have personally reviewed and noted the following in the patient's chart:   Medical and social history Use of alcohol, tobacco or illicit drugs  Current medications and supplements including opioid prescriptions. Patient is not currently taking opioid prescriptions. Functional ability and status Nutritional status Physical activity Advanced directives List of other physicians Hospitalizations, surgeries, and ER visits in previous 12 months Vitals Screenings to include cognitive, depression, and falls Referrals and appointments  In addition, I have reviewed and discussed with patient certain preventive protocols, quality metrics, and best practice recommendations. A written personalized care plan for preventive services as well as general preventive health recommendations were provided to patient.   Ozie Ned, CMA   01/28/2024   After Visit Summary: (MyChart) Due to this being a telephonic visit, the after visit summary with patients personalized plan was offered to  patient via MyChart   Notes: Nothing significant to report at this time.

## 2024-01-29 NOTE — Progress Notes (Signed)
 Zaccary Creech                                          MRN: [id number]   01/29/2024   The VBCI Quality Team Specialist reviewed this patient medical record for the purposes of chart review for care gap closure. The following were reviewed: abstraction for care gap closure-glycemic status assessment.    VBCI Quality Team

## 2024-01-29 NOTE — Progress Notes (Signed)
 Casey Daniels                                          MRN: [id number]   01/29/2024   The VBCI Quality Team Specialist reviewed this patient medical record for the purposes of chart review for care gap closure. The following were reviewed: chart review for care gap closure-kidney health evaluation for diabetes:eGFR  and uACR.    VBCI Quality Team

## 2024-02-12 ENCOUNTER — Encounter: Payer: Self-pay | Admitting: Family

## 2024-02-12 ENCOUNTER — Ambulatory Visit: Payer: Self-pay | Admitting: Family

## 2024-02-12 VITALS — BP 133/72 | HR 68 | Temp 97.0°F | Ht 75.0 in

## 2024-02-12 DIAGNOSIS — E559 Vitamin D deficiency, unspecified: Secondary | ICD-10-CM

## 2024-02-12 DIAGNOSIS — Z7984 Long term (current) use of oral hypoglycemic drugs: Secondary | ICD-10-CM

## 2024-02-12 DIAGNOSIS — E785 Hyperlipidemia, unspecified: Secondary | ICD-10-CM

## 2024-02-12 DIAGNOSIS — Z1211 Encounter for screening for malignant neoplasm of colon: Secondary | ICD-10-CM

## 2024-02-12 DIAGNOSIS — N528 Other male erectile dysfunction: Secondary | ICD-10-CM

## 2024-02-12 DIAGNOSIS — Z0000 Encounter for general adult medical examination without abnormal findings: Secondary | ICD-10-CM

## 2024-02-12 DIAGNOSIS — Z23 Encounter for immunization: Secondary | ICD-10-CM | POA: Diagnosis not present

## 2024-02-12 DIAGNOSIS — E1169 Type 2 diabetes mellitus with other specified complication: Secondary | ICD-10-CM | POA: Diagnosis not present

## 2024-02-12 DIAGNOSIS — Z0001 Encounter for general adult medical examination with abnormal findings: Secondary | ICD-10-CM

## 2024-02-12 DIAGNOSIS — Z1159 Encounter for screening for other viral diseases: Secondary | ICD-10-CM

## 2024-02-12 DIAGNOSIS — J4541 Moderate persistent asthma with (acute) exacerbation: Secondary | ICD-10-CM

## 2024-02-12 LAB — LIPID PANEL

## 2024-02-12 LAB — BAYER DCA HB A1C WAIVED: HB A1C (BAYER DCA - WAIVED): 6.3 % — ABNORMAL HIGH (ref 4.8–5.6)

## 2024-02-12 MED ORDER — ATORVASTATIN CALCIUM 20 MG PO TABS: 20.0000 mg | ORAL_TABLET | Freq: Every day | ORAL | 0 refills | Status: AC

## 2024-02-12 MED ORDER — VITAMIN D (ERGOCALCIFEROL) 1.25 MG (50000 UNIT) PO CAPS: 50000.0000 [IU] | ORAL_CAPSULE | ORAL | 3 refills | Status: AC

## 2024-02-12 MED ORDER — METFORMIN HCL ER 750 MG PO TB24: 750.0000 mg | ORAL_TABLET | Freq: Every day | ORAL | 1 refills | Status: AC

## 2024-02-12 NOTE — Progress Notes (Signed)
 Subjective:    Patient ID: Casey Daniels, male    DOB: Aug 03, 1975, 48 y.o.   MRN: [id number]  Chief Complaint  Patient presents with   Medical Management of Chronic Issues   Pt presents to the office today for CPE and chronic follow up.    He reports he quit alcohol and smoking 05/07/22. Has stopped his Symbicort  since stopping smoking.    He is complaining intermittent ED. He has taken Viagra  with mild relief. Asking for referral to Urologists.  Asthma He complains of cough. There is no shortness of breath or wheezing. This is a chronic problem. The current episode started more than 1 year ago. The problem occurs intermittently. The cough is dry. His symptoms are aggravated by pollen and change in weather. His symptoms are alleviated by rest and OTC cough suppressant. He reports moderate improvement on treatment. His past medical history is significant for asthma.  Hyperlipidemia This is a chronic problem. The current episode started more than 1 year ago. The problem is controlled. Recent lipid tests were reviewed and are normal. Pertinent negatives include no shortness of breath. Current antihyperlipidemic treatment includes statins. The current treatment provides moderate improvement of lipids. Risk factors for coronary artery disease include dyslipidemia, diabetes mellitus, hypertension, a sedentary lifestyle, post-menopausal and male sex.  Diabetes He presents for his follow-up diabetic visit. He has type 2 diabetes mellitus. Pertinent negatives for diabetes include no blurred vision and no foot paresthesias. Symptoms are stable. Risk factors for coronary artery disease include dyslipidemia, diabetes mellitus, hypertension, sedentary lifestyle and male sex. He is following a generally healthy diet. His overall blood glucose range is 130-140 mg/dl. Eye exam is not current.      Review of Systems  Eyes:  Negative for blurred vision.  Respiratory:  Positive for cough. Negative for  shortness of breath and wheezing.   All other systems reviewed and are negative.  Family History  Problem Relation Age of Onset   Diabetes Mother    Healthy Brother    Social History   Socioeconomic History   Marital status: Single    Spouse name: Not on file   Number of children: 0   Years of education: 12   Highest education level: High school graduate  Occupational History   Occupation: unemployed    Comment: disabled  Tobacco Use   Smoking status: Former    Current packs/day: 0.00    Average packs/day: 0.3 packs/day for 15.0 years (3.8 ttl pk-yrs)    Types: Cigarettes    Quit date: 05/07/2022    Years since quitting: 1.7   Smokeless tobacco: Never   Tobacco comments:    quits off and on  Vaping Use   Vaping status: Never Used  Substance and Sexual Activity   Alcohol use: Yes    Alcohol/week: 2.0 standard drinks of alcohol    Types: 2 Cans of beer per week   Drug use: Yes    Types: Marijuana   Sexual activity: Not Currently    Birth control/protection: None  Other Topics Concern   Not on file  Social History Narrative   Lives with his brother. No vehicle - rides his bike or walks to local appointments - Gets [facility] if appt out of town   Social Drivers of [employer] Strain: Low Risk  (01/28/2024)   Overall Financial Resource Strain (CARDIA)    Difficulty of Paying Living Expenses: Not hard at all  Food Insecurity: No Food Insecurity (01/28/2024)  Hunger Vital Sign    Worried About Running Out of Food in the Last Year: Never true    Ran Out of Food in the Last Year: Never true  Transportation Needs: No Transportation Needs (01/28/2024)   PRAPARE - Administrator, Civil Service (Medical): No    Lack of Transportation (Non-Medical): No  Physical Activity: Sufficiently Active (01/28/2024)   Exercise Vital Sign    Days of Exercise per Week: 7 days    Minutes of Exercise per Session: 30 min  Stress: No Stress Concern Present  (01/28/2024)   [employer] of Occupational Health - Occupational Stress Questionnaire    Feeling of Stress: Only a little  Social Connections: Unknown (01/28/2024)   Social Connection and Isolation Panel    Frequency of Communication with Friends and Family: Twice a week    Frequency of Social Gatherings with Friends and Family: Twice a week    Attends Religious Services: 1 to 4 times per year    Active Member of [employer] or Organizations: No    Attends Banker Meetings: Never    Marital Status: Patient unable to answer        Objective:   Physical Exam Vitals reviewed.  Constitutional:      General: He is not in acute distress.    Appearance: He is well-developed.  HENT:     Head: Normocephalic.     Right Ear: Tympanic membrane normal.     Left Ear: There is impacted cerumen.  Eyes:     General:        Right eye: No discharge.        Left eye: No discharge.     Pupils: Pupils are equal, round, and reactive to light.  Neck:     Thyroid : No thyromegaly.  Cardiovascular:     Rate and Rhythm: Normal rate and regular rhythm.     Heart sounds: Normal heart sounds. No murmur heard. Pulmonary:     Effort: Pulmonary effort is normal. No respiratory distress.     Breath sounds: Normal breath sounds. No wheezing.  Abdominal:     General: Bowel sounds are normal. There is no distension.     Palpations: Abdomen is soft.     Tenderness: There is no abdominal tenderness.  Musculoskeletal:        General: No tenderness. Normal range of motion.     Cervical back: Normal range of motion and neck supple.  Skin:    General: Skin is warm and dry.     Findings: No erythema or rash.  Neurological:     Mental Status: He is alert and oriented to person, place, and time.     Cranial Nerves: No cranial nerve deficit.     Deep Tendon Reflexes: Reflexes are normal and symmetric.  Psychiatric:        Behavior: Behavior normal.        Thought Content: Thought content normal.         Judgment: Judgment normal.    Diabetic Foot Exam - Simple   Simple Foot Form Diabetic Foot exam was performed with the following findings: Yes 02/12/2024  1:37 PM  Visual Inspection No deformities, no ulcerations, no other skin breakdown bilaterally: Yes Sensation Testing See comments: Yes Pulse Check Posterior Tibialis and Dorsalis pulse intact bilaterally: Yes Comments Negative monofilament in great toe        BP 133/72   Pulse 68   Temp (!) 97 F (36.1 C) (Temporal)  Ht 6' 3 (1.905 m)   SpO2 97%   BMI 27.25 kg/m      Assessment & Plan:  Casey Daniels comes in today with chief complaint of Medical Management of Chronic Issues   Diagnosis and orders addressed:  1. Hyperlipidemia associated with type 2 diabetes mellitus (HCC) - atorvastatin  (LIPITOR) 20 MG tablet; Take 1 tablet (20 mg total) by mouth daily.  Dispense: 90 tablet; Refill: 0 - CMP14+EGFR - CBC with Differential/Platelet - Lipid panel  2. Type 2 diabetes mellitus with other specified complication, without long-term current use of insulin  (HCC) - metFORMIN  (GLUCOPHAGE -XR) 750 MG 24 hr tablet; Take 1 tablet (750 mg total) by mouth daily with breakfast.  Dispense: 90 tablet; Refill: 1 - Microalbumin / creatinine urine ratio - Bayer DCA Hb A1c Waived - CMP14+EGFR - CBC with Differential/Platelet - TSH - Vitamin B12  3. Encounter for immunization - Flu vaccine trivalent PF, 6mos and older(Flulaval,Afluria,Fluarix,Fluzone) - CMP14+EGFR - CBC with Differential/Platelet  4. Annual physical exam (Primary) - Microalbumin / creatinine urine ratio - Bayer DCA Hb A1c Waived - CMP14+EGFR - CBC with Differential/Platelet - Lipid panel - PSA, total and free - TSH - Vitamin B12 - VITAMIN D  25 Hydroxy (Vit-D Deficiency, Fractures)  5. Vitamin D  deficiency  - Vitamin D , Ergocalciferol , (DRISDOL ) 1.25 MG (50000 UNIT) CAPS capsule; Take 1 capsule (50,000 Units total) by mouth every 7 (seven)  days.  Dispense: 12 capsule; Refill: 3 - CMP14+EGFR - CBC with Differential/Platelet - VITAMIN D  25 Hydroxy (Vit-D Deficiency, Fractures)  6. Moderate persistent asthma with acute exacerbation - CMP14+EGFR - CBC with Differential/Platelet  7. Other male erectile dysfunction - Ambulatory referral to Urology  8. Need for hepatitis B screening test - Hepatitis B surface antibody,qualitative  9. Colon cancer screening - Ambulatory referral to Gastroenterology   Labs pending Continue current medications  Health Maintenance reviewed Diet and exercise encouraged  Follow up plan: 4 months   Bari Learn, FNP

## 2024-02-12 NOTE — Patient Instructions (Signed)

## 2024-02-13 LAB — CBC WITH DIFFERENTIAL/PLATELET
Basophils Absolute: 0 x10E3/uL (ref 0.0–0.2)
Basos: 1 %
EOS (ABSOLUTE): 0.1 x10E3/uL (ref 0.0–0.4)
Eos: 3 %
Hematocrit: 44.9 % (ref 37.5–51.0)
Hemoglobin: 14.5 g/dL (ref 13.0–17.7)
Immature Grans (Abs): 0 x10E3/uL (ref 0.0–0.1)
Immature Granulocytes: 0 %
Lymphocytes Absolute: 1.9 x10E3/uL (ref 0.7–3.1)
Lymphs: 46 %
MCH: 28.8 pg (ref 26.6–33.0)
MCHC: 32.3 g/dL (ref 31.5–35.7)
MCV: 89 fL (ref 79–97)
Monocytes Absolute: 0.4 x10E3/uL (ref 0.1–0.9)
Monocytes: 9 %
Neutrophils Absolute: 1.7 x10E3/uL (ref 1.4–7.0)
Neutrophils: 41 %
Platelets: 227 x10E3/uL (ref 150–450)
RBC: 5.03 x10E6/uL (ref 4.14–5.80)
RDW: 13 % (ref 11.6–15.4)
WBC: 4.2 x10E3/uL (ref 3.4–10.8)

## 2024-02-13 LAB — CMP14+EGFR
ALT: 44 IU/L (ref 0–44)
AST: 42 IU/L — AB (ref 0–40)
Albumin: 4.6 g/dL (ref 4.1–5.1)
Alkaline Phosphatase: 56 IU/L (ref 47–123)
BUN/Creatinine Ratio: 13 (ref 9–20)
BUN: 17 mg/dL (ref 6–24)
Bilirubin Total: 0.5 mg/dL (ref 0.0–1.2)
CO2: 21 mmol/L (ref 20–29)
Calcium: 10.1 mg/dL (ref 8.7–10.2)
Chloride: 102 mmol/L (ref 96–106)
Creatinine, Ser: 1.27 mg/dL (ref 0.76–1.27)
Globulin, Total: 2.8 g/dL (ref 1.5–4.5)
Glucose: 95 mg/dL (ref 70–99)
Potassium: 4.6 mmol/L (ref 3.5–5.2)
Sodium: 140 mmol/L (ref 134–144)
Total Protein: 7.4 g/dL (ref 6.0–8.5)
eGFR: 69 mL/min/1.73 (ref >59)

## 2024-02-13 LAB — MICROALBUMIN / CREATININE URINE RATIO
Creatinine, Urine: 139.7 mg/dL
Microalb/Creat Ratio: 7 mg/g{creat} (ref 0–29)
Microalbumin, Urine: 10.2 ug/mL

## 2024-02-13 LAB — PSA, TOTAL AND FREE
PSA, Free Pct: 30 %
PSA, Free: 0.09 ng/mL
Prostate Specific Ag, Serum: 0.3 ng/mL (ref 0.0–4.0)

## 2024-02-13 LAB — LIPID PANEL
Cholesterol, Total: 140 mg/dL (ref 100–199)
HDL: 57 mg/dL (ref >39)
LDL CALC COMMENT:: 2.5 ratio (ref 0.0–5.0)
LDL Chol Calc (NIH): 69 mg/dL (ref 0–99)
Triglycerides: 68 mg/dL (ref 0–149)
VLDL Cholesterol Cal: 14 mg/dL (ref 5–40)

## 2024-02-13 LAB — VITAMIN B12: Vitamin B-12: 1194 pg/mL (ref 232–1245)

## 2024-02-13 LAB — TSH: TSH: 2.1 u[IU]/mL (ref 0.450–4.500)

## 2024-02-13 LAB — VITAMIN D 25 HYDROXY (VIT D DEFICIENCY, FRACTURES): Vit D, 25-Hydroxy: 30.2 ng/mL (ref 30.0–100.0)

## 2024-02-13 LAB — HEPATITIS B SURFACE ANTIBODY,QUALITATIVE: Hep B Surface Ab, Qual: NONREACTIVE

## 2024-02-14 ENCOUNTER — Ambulatory Visit: Payer: Self-pay | Admitting: Family

## 2024-03-03 ENCOUNTER — Telehealth: Payer: Self-pay

## 2024-03-03 NOTE — Progress Notes (Signed)
 Pharmacy Quality Measure Review  This patient is appearing on a report for being at risk of failing the adherence measure for diabetes medications this calendar year.   Medication: metformin  750 mg  Last fill date: 10/12/2023 for 90 day supply  Left voicemail for patient to return my call at their convenience. and Contacted pharmacy to facilitate refills.  Woodie Jock, PharmD PGY1 Pharmacy Resident  03/03/2024

## 2024-04-30 ENCOUNTER — Ambulatory Visit: Admitting: Urology

## 2024-04-30 DIAGNOSIS — N529 Male erectile dysfunction, unspecified: Secondary | ICD-10-CM

## 2024-05-15 ENCOUNTER — Ambulatory Visit: Admitting: Family

## 2024-07-23 ENCOUNTER — Ambulatory Visit: Admitting: Urology
